# Patient Record
Sex: Female | Born: 1950 | Race: Black or African American | Hispanic: No | Marital: Married | Smoking: Never smoker
Health system: Southern US, Community
[De-identification: ages and names within clinical notes are randomized; demographics above are authoritative.]

## PROBLEM LIST (undated history)

## (undated) DIAGNOSIS — E785 Hyperlipidemia, unspecified: Secondary | ICD-10-CM

## (undated) DIAGNOSIS — I10 Essential (primary) hypertension: Secondary | ICD-10-CM

## (undated) DIAGNOSIS — C541 Malignant neoplasm of endometrium: Secondary | ICD-10-CM

## (undated) DIAGNOSIS — H409 Unspecified glaucoma: Secondary | ICD-10-CM

## (undated) HISTORY — PX: TOTAL KNEE ARTHROPLASTY: SHX125

## (undated) HISTORY — PX: OTHER SURGICAL HISTORY: SHX169

## (undated) HISTORY — PX: TOTAL ABDOMINAL HYSTERECTOMY W/ BILATERAL SALPINGOOPHORECTOMY: SHX83

## (undated) HISTORY — PX: TONSILLECTOMY: SUR1361

---

## 1995-10-28 DIAGNOSIS — D259 Leiomyoma of uterus, unspecified: Secondary | ICD-10-CM | POA: Insufficient documentation

## 2000-04-04 DIAGNOSIS — D509 Iron deficiency anemia, unspecified: Secondary | ICD-10-CM | POA: Diagnosis present

## 2016-08-02 DIAGNOSIS — R7303 Prediabetes: Secondary | ICD-10-CM | POA: Insufficient documentation

## 2020-07-12 DIAGNOSIS — D696 Thrombocytopenia, unspecified: Secondary | ICD-10-CM | POA: Diagnosis present

## 2021-05-19 ENCOUNTER — Other Ambulatory Visit: Payer: Self-pay

## 2021-05-19 ENCOUNTER — Encounter (HOSPITAL_COMMUNITY): Payer: Self-pay | Admitting: Internal Medicine

## 2021-05-19 ENCOUNTER — Inpatient Hospital Stay (HOSPITAL_COMMUNITY)
Admission: AD | Admit: 2021-05-19 | Discharge: 2021-06-02 | DRG: 180 | Disposition: A | Discharge status: Home / Self Care | Payer: Medicare Other | Source: Other Acute Inpatient Hospital | Attending: Family Medicine | Admitting: Family Medicine

## 2021-05-19 DIAGNOSIS — R042 Hemoptysis: Secondary | ICD-10-CM | POA: Diagnosis present

## 2021-05-19 DIAGNOSIS — H409 Unspecified glaucoma: Secondary | ICD-10-CM | POA: Diagnosis present

## 2021-05-19 DIAGNOSIS — C7802 Secondary malignant neoplasm of left lung: Secondary | ICD-10-CM | POA: Diagnosis present

## 2021-05-19 DIAGNOSIS — E876 Hypokalemia: Secondary | ICD-10-CM | POA: Diagnosis not present

## 2021-05-19 DIAGNOSIS — D61818 Other pancytopenia: Secondary | ICD-10-CM | POA: Diagnosis not present

## 2021-05-19 DIAGNOSIS — K5909 Other constipation: Secondary | ICD-10-CM | POA: Insufficient documentation

## 2021-05-19 DIAGNOSIS — R918 Other nonspecific abnormal finding of lung field: Secondary | ICD-10-CM | POA: Diagnosis not present

## 2021-05-19 DIAGNOSIS — Z91040 Latex allergy status: Secondary | ICD-10-CM | POA: Diagnosis not present

## 2021-05-19 DIAGNOSIS — D509 Iron deficiency anemia, unspecified: Secondary | ICD-10-CM | POA: Diagnosis present

## 2021-05-19 DIAGNOSIS — D6959 Other secondary thrombocytopenia: Secondary | ICD-10-CM | POA: Diagnosis present

## 2021-05-19 DIAGNOSIS — E785 Hyperlipidemia, unspecified: Secondary | ICD-10-CM | POA: Diagnosis present

## 2021-05-19 DIAGNOSIS — D649 Anemia, unspecified: Secondary | ICD-10-CM | POA: Diagnosis not present

## 2021-05-19 DIAGNOSIS — I82220 Acute embolism and thrombosis of inferior vena cava: Secondary | ICD-10-CM | POA: Diagnosis not present

## 2021-05-19 DIAGNOSIS — Z9221 Personal history of antineoplastic chemotherapy: Secondary | ICD-10-CM | POA: Diagnosis not present

## 2021-05-19 DIAGNOSIS — I82452 Acute embolism and thrombosis of left peroneal vein: Secondary | ICD-10-CM | POA: Diagnosis not present

## 2021-05-19 DIAGNOSIS — D6481 Anemia due to antineoplastic chemotherapy: Secondary | ICD-10-CM | POA: Diagnosis present

## 2021-05-19 DIAGNOSIS — Z7189 Other specified counseling: Secondary | ICD-10-CM | POA: Diagnosis not present

## 2021-05-19 DIAGNOSIS — Z90710 Acquired absence of both cervix and uterus: Secondary | ICD-10-CM

## 2021-05-19 DIAGNOSIS — I10 Essential (primary) hypertension: Secondary | ICD-10-CM | POA: Diagnosis present

## 2021-05-19 DIAGNOSIS — D630 Anemia in neoplastic disease: Secondary | ICD-10-CM | POA: Diagnosis present

## 2021-05-19 DIAGNOSIS — Z96652 Presence of left artificial knee joint: Secondary | ICD-10-CM | POA: Diagnosis present

## 2021-05-19 DIAGNOSIS — D539 Nutritional anemia, unspecified: Secondary | ICD-10-CM | POA: Diagnosis present

## 2021-05-19 DIAGNOSIS — Z8249 Family history of ischemic heart disease and other diseases of the circulatory system: Secondary | ICD-10-CM | POA: Diagnosis not present

## 2021-05-19 DIAGNOSIS — Z923 Personal history of irradiation: Secondary | ICD-10-CM

## 2021-05-19 DIAGNOSIS — I480 Paroxysmal atrial fibrillation: Secondary | ICD-10-CM | POA: Diagnosis present

## 2021-05-19 DIAGNOSIS — C772 Secondary and unspecified malignant neoplasm of intra-abdominal lymph nodes: Secondary | ICD-10-CM

## 2021-05-19 DIAGNOSIS — D62 Acute posthemorrhagic anemia: Secondary | ICD-10-CM

## 2021-05-19 DIAGNOSIS — Z91048 Other nonmedicinal substance allergy status: Secondary | ICD-10-CM

## 2021-05-19 DIAGNOSIS — C7800 Secondary malignant neoplasm of unspecified lung: Secondary | ICD-10-CM | POA: Diagnosis present

## 2021-05-19 DIAGNOSIS — I82431 Acute embolism and thrombosis of right popliteal vein: Secondary | ICD-10-CM | POA: Diagnosis not present

## 2021-05-19 DIAGNOSIS — C7801 Secondary malignant neoplasm of right lung: Secondary | ICD-10-CM | POA: Diagnosis present

## 2021-05-19 DIAGNOSIS — C541 Malignant neoplasm of endometrium: Principal | ICD-10-CM | POA: Diagnosis present

## 2021-05-19 DIAGNOSIS — D696 Thrombocytopenia, unspecified: Secondary | ICD-10-CM | POA: Diagnosis not present

## 2021-05-19 DIAGNOSIS — Z79899 Other long term (current) drug therapy: Secondary | ICD-10-CM

## 2021-05-19 HISTORY — DX: Hyperlipidemia, unspecified: E78.5

## 2021-05-19 HISTORY — DX: Unspecified glaucoma: H40.9

## 2021-05-19 HISTORY — DX: Malignant neoplasm of endometrium: C54.1

## 2021-05-19 HISTORY — DX: Essential (primary) hypertension: I10

## 2021-05-19 NOTE — Assessment & Plan Note (Deleted)
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

## 2021-05-20 ENCOUNTER — Encounter (HOSPITAL_COMMUNITY): Payer: Self-pay | Admitting: Internal Medicine

## 2021-05-20 ENCOUNTER — Inpatient Hospital Stay (HOSPITAL_COMMUNITY): Payer: Medicare Other

## 2021-05-20 DIAGNOSIS — R042 Hemoptysis: Secondary | ICD-10-CM | POA: Diagnosis not present

## 2021-05-20 DIAGNOSIS — E785 Hyperlipidemia, unspecified: Secondary | ICD-10-CM | POA: Diagnosis present

## 2021-05-20 DIAGNOSIS — D649 Anemia, unspecified: Secondary | ICD-10-CM

## 2021-05-20 DIAGNOSIS — H409 Unspecified glaucoma: Secondary | ICD-10-CM | POA: Diagnosis present

## 2021-05-20 LAB — BASIC METABOLIC PANEL
Anion gap: 7 (ref 5–15)
BUN: 15 mg/dL (ref 8–23)
CO2: 25 mmol/L (ref 22–32)
Calcium: 8.7 mg/dL — ABNORMAL LOW (ref 8.9–10.3)
Chloride: 106 mmol/L (ref 98–111)
Creatinine, Ser: 0.72 mg/dL (ref 0.44–1.00)
GFR, Estimated: >60 mL/min (ref >60)
Glucose, Bld: 122 mg/dL — ABNORMAL HIGH (ref 70–99)
Potassium: 3.4 mmol/L — ABNORMAL LOW (ref 3.5–5.1)
Sodium: 138 mmol/L (ref 135–145)

## 2021-05-20 LAB — CBC
HCT: 23.5 % — ABNORMAL LOW (ref 36.0–46.0)
Hemoglobin: 7.8 g/dL — ABNORMAL LOW (ref 12.0–15.0)
MCH: 35.6 pg — ABNORMAL HIGH (ref 26.0–34.0)
MCHC: 33.2 g/dL (ref 30.0–36.0)
MCV: 107.3 fL — ABNORMAL HIGH (ref 80.0–100.0)
Platelets: 80 10*3/uL — ABNORMAL LOW (ref 150–400)
RBC: 2.19 MIL/uL — ABNORMAL LOW (ref 3.87–5.11)
RDW: 17.7 % — ABNORMAL HIGH (ref 11.5–15.5)
WBC: 4.9 10*3/uL (ref 4.0–10.5)
nRBC: 0 % (ref 0.0–0.2)

## 2021-05-20 IMAGING — DX DG CHEST 1V PORT
1 series · 1 of 1 positions shown · non-contrast
Comparison: None.

CLINICAL DATA: Hemoptysis. Endometrial carcinoma reported in the
medical history.

EXAM:
PORTABLE CHEST 1 VIEW

[chest ap]
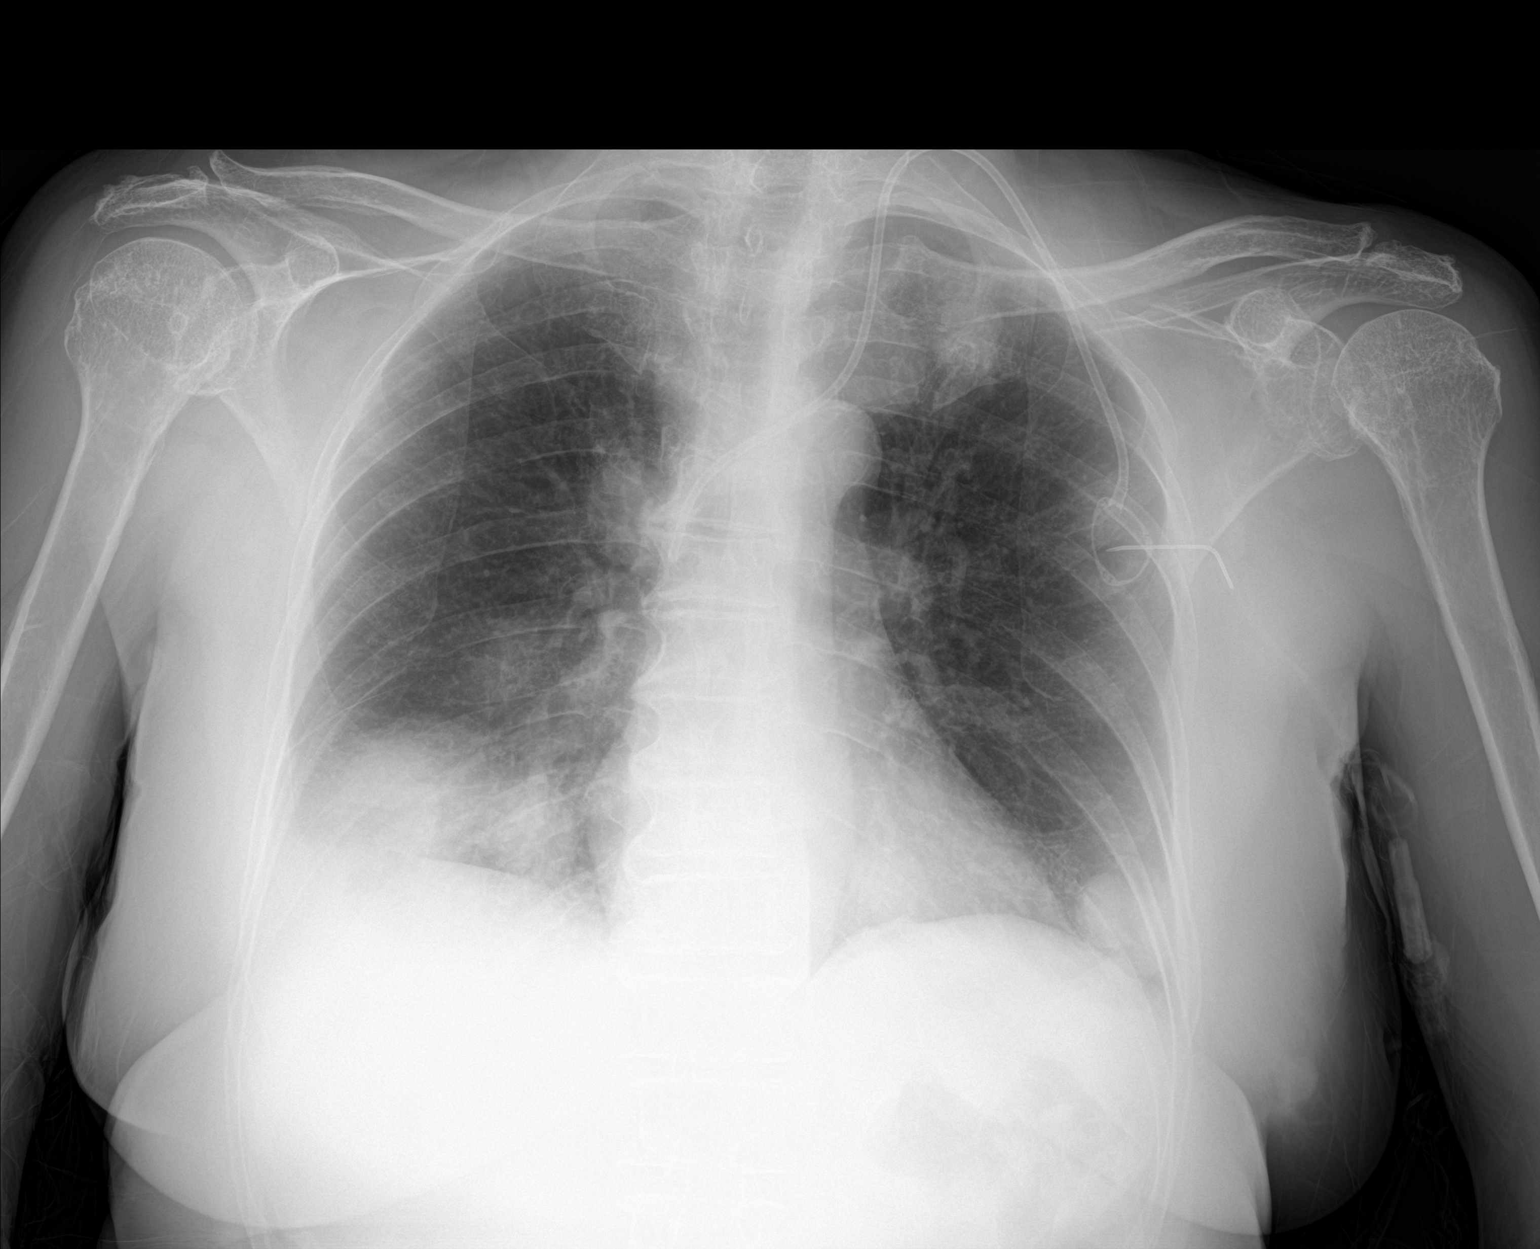

[1 of 1 positions shown; findings below may reference images not displayed]

FINDINGS: Cardiac silhouette is normal in size and configuration. No
mediastinal masses. Focal prominence of the superior right hilum.
Normal left hilar contours.

Rounded opacities are noted in both lung bases, largest on the right
where it measures 6.8 cm. Opacity at the left lateral lung base
measures 2.9 cm. Less well-defined and smaller opacity projects near
the left apex. There is also hazy opacity superimposed over the
inferior right hilar structures. No evidence of pulmonary edema. No
convincing pleural effusion and no pneumothorax.

Skeletal structures are demineralized, but grossly intact.

Left anterior chest wall, internal jugular, Port-A-Cath has its tip
in the mid superior vena cava.
IMPRESSION: 1. Rounded lung opacities, largest at the right lung base, which may
reflect masses/confluent nodules suspected to be metastatic disease.
Pneumonia should be considered if there are consistent clinical
findings. Recommend further assessment with chest CT with contrast.

## 2021-05-20 MED ORDER — SODIUM CHLORIDE 0.9% FLUSH: 10.0000 mL | INTRAVENOUS | Status: DC | PRN

## 2021-05-20 MED ORDER — ACETAMINOPHEN 650 MG RE SUPP: 650.0000 mg | Freq: Four times a day (QID) | RECTAL | Status: DC | PRN

## 2021-05-20 MED ORDER — SENNA 8.6 MG PO TABS
1.0000 | ORAL_TABLET | Freq: Two times a day (BID) | ORAL | Status: DC
  Administered 2021-05-20 – 2021-06-02 (×26): 8.6 mg via ORAL
  Filled 2021-05-20 (×26): qty 1

## 2021-05-20 MED ORDER — SODIUM CHLORIDE 0.9% FLUSH
3.0000 mL | INTRAVENOUS | Status: DC | PRN
Start: 2021-05-20 — End: 2021-06-02
  Administered 2021-05-28: 3 mL via INTRAVENOUS

## 2021-05-20 MED ORDER — CHLORHEXIDINE GLUCONATE CLOTH 2 % EX PADS
6.0000 | MEDICATED_PAD | Freq: Every day | CUTANEOUS | Status: DC
  Administered 2021-05-20 – 2021-06-02 (×14): 6 via TOPICAL

## 2021-05-20 MED ORDER — ACETAMINOPHEN 325 MG PO TABS
650.0000 mg | ORAL_TABLET | Freq: Four times a day (QID) | ORAL | Status: DC | PRN
  Administered 2021-05-24 – 2021-05-28 (×2): 650 mg via ORAL
  Filled 2021-05-20 (×2): qty 2

## 2021-05-20 MED ORDER — TRAZODONE HCL 50 MG PO TABS
25.0000 mg | ORAL_TABLET | Freq: Every evening | ORAL | Status: DC | PRN
  Administered 2021-05-20 – 2021-05-24 (×2): 25 mg via ORAL
  Filled 2021-05-20 (×2): qty 1

## 2021-05-20 MED ORDER — SODIUM CHLORIDE 0.9% FLUSH
3.0000 mL | Freq: Two times a day (BID) | INTRAVENOUS | Status: DC
  Administered 2021-05-20 – 2021-06-02 (×18): 3 mL via INTRAVENOUS

## 2021-05-20 MED ORDER — SODIUM CHLORIDE 0.9% FLUSH
10.0000 mL | Freq: Two times a day (BID) | INTRAVENOUS | Status: DC
  Administered 2021-05-20 – 2021-05-28 (×17): 10 mL
  Administered 2021-05-29: 40 mL
  Administered 2021-05-29 – 2021-06-02 (×8): 10 mL

## 2021-05-20 MED ORDER — SODIUM CHLORIDE 0.9 % IV SOLN
250.0000 mL | INTRAVENOUS | Status: DC | PRN
  Administered 2021-05-28: 250 mL via INTRAVENOUS

## 2021-05-20 MED ORDER — POTASSIUM CHLORIDE CRYS ER 20 MEQ PO TBCR
40.0000 meq | EXTENDED_RELEASE_TABLET | Freq: Once | ORAL | Status: AC
  Administered 2021-05-20: 40 meq via ORAL
  Filled 2021-05-20: qty 2

## 2021-05-20 NOTE — Assessment & Plan Note (Addendum)
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 She continues to have some intermittent hemoptysis, but this appears mostly stable/improving. ?CT 4/1 with right lower lobe mass increased in size, bilateral pulm nodules increased in size, mediastinal and L supraclavicular LAD increased in size ?PCCM signed off 4/3 - recommended continuing to hold anticoagulation given severity of prior bleeds - consider IR pulm angiogram with embolization to area of RLL if rebleeds  - lesion in bronchus intermedius likely inspissated clot ?IR embolization deferred for life threatening hemoptysis ?

## 2021-05-20 NOTE — Subjective & Objective (Signed)
Kristen Freeman, a very pleasant woman, was diagnosed with endometrial cancer in 2019. She was in remission until Dec '22 when she was found to have recurrence. She underwent XRT and chemo completing treatment 04/25/21. She had an episode of hemoptysis 05/17/21. She sought care at ED in [place]. She was found to have thrombocytopenia and did had platelet transfusion.  ? ?Reviewed all labs and radiology from outside hospital: ?Lab - Glucose 115, INR 1.1, Hgb 8.4, WBC 4.4, Plt 59, nl Diff.  ?CTA chest - increased pulmonary mets: multiple large nodules. ? Post-obstructive consolidation with nodule 7.6x3.2x2 cm. Bulky adenopathy. ?EKG - nl ? ?She received platelets in [place] ED. ?

## 2021-05-20 NOTE — H&P (Signed)
? ?History and Physical  ? ? ?Kristen Freeman ZOX:[id number] DOB: January 28, 1951 DOA: 05/19/2021 ? ?DOS: the patient was seen and examined on 05/19/2021 ? ?PCP: Allie Dimmer, MD  ? ?Patient coming from:  transfer from [facility] ED ? ?I have personally briefly reviewed patient's old medical records in [place] ? ?Kristen Freeman, a very pleasant woman, was diagnosed with endometrial cancer in 2019. She was in remission until Dec '22 when she was found to have recurrence. She underwent XRT and chemo completing treatment 04/25/21. She had an episode of hemoptysis 05/17/21. She sought care at ED in [place]. She was found to have thrombocytopenia and did had platelet transfusion.  ? ?Reviewed all labs and radiology from outside hospital: ?Lab - Glucose 115, INR 1.1, Hgb 8.4, WBC 4.4, Plt 59, nl Diff.  ?CTA chest - increased pulmonary mets: multiple large nodules. ? Post-obstructive consolidation with nodule 7.6x3.2x2 cm. Bulky adenopathy. ?EKG - nl ? ?She received platelets in [place] ED.  ? ?ED Course: direct admit ? ?Review of Systems:  ?Review of Systems  ?Constitutional: Negative.   ?HENT: Negative.    ?Eyes: Negative.   ?Respiratory:  Positive for cough and hemoptysis.   ?Cardiovascular: Negative.   ?Gastrointestinal: Negative.   ?Genitourinary: Negative.   ?Musculoskeletal: Negative.   ?Skin: Negative.   ?Neurological: Negative.   ?Endo/Heme/Allergies: Negative.   ?Psychiatric/Behavioral: Negative.    ? ?Past Medical History:  ?Diagnosis Date  ? Endometrial cancer ([place])   ? dx 2019 tx'd; recurrence 12/22 - XRT x 30, Chem 03/21/21 to 04/25/21  ? Glaucoma   ? HLD (hyperlipidemia)   ? on statin  ? HTN (hypertension)   ? ? ?Past Surgical History:  ?Procedure Laterality Date  ? TM repair N/A   ? TONSILLECTOMY    ? TOTAL ABDOMINAL HYSTERECTOMY W/ BILATERAL SALPINGOOPHORECTOMY    ? TOTAL KNEE ARTHROPLASTY Left   ? ? ?Soc Hx - married 2 years. Three children, 7 grandchilden, 1 great. Worked as a Engineer, structural, now retired. Lives with husband. I-ADLs ? ? has no history on file for tobacco use, alcohol use, and drug use. ? ?Allergies  ?Allergen Reactions  ? Latex   ? ? ?Family History  ?Problem Relation Age of Onset  ? Alzheimer's disease Mother   ? Diabetes Father   ? Prostate cancer Father   ? Hypertension Sister   ? Diabetes Sister   ? Breast cancer Sister   ? Hypertension Sister   ? Diabetes Sister   ? CVA Brother   ? Alcoholism Brother   ? ? ?Prior to Admission medications   ?Not on File  ? ? ?Physical Exam: ?Vitals:  ? 05/19/21 2117  ?BP: 133/74  ?Pulse: (!) 109  ?Resp: 20  ?Temp: 99.2 ?F (37.3 ?C)  ?TempSrc: Oral  ?SpO2: 100%  ? ? ?Physical Exam ?Constitutional:   ?   Appearance: Normal appearance. She is normal weight.  ?HENT:  ?   Head: Normocephalic and atraumatic.  ?   Mouth/Throat:  ?   Mouth: Mucous membranes are moist.  ?   Pharynx: Oropharynx is clear.  ?Eyes:  ?   Extraocular Movements: Extraocular movements intact.  ?   Conjunctiva/sclera: Conjunctivae normal.  ?   Pupils: Pupils are equal, round, and reactive to light.  ?Cardiovascular:  ?   Rate and Rhythm: Normal rate and regular rhythm.  ?   Pulses: Normal pulses.  ?   Heart sounds: Normal heart sounds.  ?Pulmonary:  ?   Effort:  Pulmonary effort is normal.  ?   Breath sounds: Normal breath sounds.  ?Abdominal:  ?   General: Abdomen is flat. Bowel sounds are normal.  ?   Palpations: Abdomen is soft.  ?Musculoskeletal:     ?   General: Normal range of motion.  ?   Cervical back: Normal range of motion and neck supple.  ?Lymphadenopathy:  ?   Head:  ?   Right side of head: No submental or submandibular adenopathy.  ?   Left side of head: No submental or submandibular adenopathy.  ?   Cervical: Cervical adenopathy present.  ?   Right cervical: No superficial cervical adenopathy. ?   Left cervical: No superficial cervical adenopathy.  ?   Upper Body:  ?   Right upper body: No supraclavicular or axillary adenopathy.  ?   Left upper body:  Supraclavicular adenopathy present. No axillary adenopathy.  ?   Lower Body: No right inguinal adenopathy. No left inguinal adenopathy.  ?   Comments: 1 cm firm left supraclavicular node  ?Skin: ?   General: Skin is warm and dry.  ?Neurological:  ?   General: No focal deficit present.  ?   Mental Status: She is alert and oriented to person, place, and time.  ?Psychiatric:     ?   Mood and Affect: Mood normal.     ?   Behavior: Behavior normal.  ?  ? ?Labs on Admission: I have personally reviewed following labs and imaging studies ? ?CBC: ?No results for input(s): WBC, NEUTROABS, HGB, HCT, MCV, PLT in the last 168 hours. ?Basic Metabolic Panel: ?No results for input(s): NA, K, CL, CO2, GLUCOSE, BUN, CREATININE, CALCIUM, MG, PHOS in the last 168 hours. ?GFR: ?CrCl cannot be calculated (No successful lab value found.). ?Liver Function Tests: ?No results for input(s): AST, ALT, ALKPHOS, BILITOT, PROT, ALBUMIN in the last 168 hours. ?No results for input(s): LIPASE, AMYLASE in the last 168 hours. ?No results for input(s): AMMONIA in the last 168 hours. ?Coagulation Profile: ?No results for input(s): INR, PROTIME in the last 168 hours. ?Cardiac Enzymes: ?No results for input(s): CKTOTAL, CKMB, CKMBINDEX, TROPONINI in the last 168 hours. ?BNP (last 3 results) ?No results for input(s): PROBNP in the last 8760 hours. ?HbA1C: ?No results for input(s): HGBA1C in the last 72 hours. ?CBG: ?No results for input(s): GLUCAP in the last 168 hours. ?Lipid Profile: ?No results for input(s): CHOL, HDL, LDLCALC, TRIG, CHOLHDL, LDLDIRECT in the last 72 hours. ?Thyroid Function Tests: ?No results for input(s): TSH, T4TOTAL, FREET4, T3FREE, THYROIDAB in the last 72 hours. ?Anemia Panel: ?No results for input(s): VITAMINB12, FOLATE, FERRITIN, TIBC, IRON, RETICCTPCT in the last 72 hours. ?Urine analysis: ?No results found for: COLORURINE, APPEARANCEUR, [place], [place], [place], [place], [place], KETONESUR, PROTEINUR, [place],  NITRITE, LEUKOCYTESUR ? ?Radiological Exams on Admission: I have personally reviewed images ?No results found. ? ?EKG: I have personally reviewed EKG: reviewed outside EKG - Nl ? ?Assessment/Plan ?Principal Problem: ?  Hemoptysis ?Active Problems: ?  Endometrial cancer ([place]) ?  HTN (hypertension) ?  HLD (hyperlipidemia) ?  Glaucoma ?  ? ?Assessment and Plan: ?* Hemoptysis ?Patient presented to ED in [place] with small volume hemoptysis. She has had several occurrences. MOst likely related to pulmonary metastatic disease ? ?Plan Pulmonary consult - bronchoscopy. ? ?Endometrial cancer ([place]) ?Patient diagnosed 2019 - treated, leading to remission. Recurrence Dec'22, received XRT x 30 txs 03/21/21, chemo thru 04/25/21. Now with lung nodules, left supraclavicular adenopathy, hemoptysis. ? ?Plan Regular admit ?  PUlm consult - may need bronchoscopy ? Oncology/hematology consult re: completing workup and treatment plan ? ?Glaucoma ?May use eye drops from home. ? ?HLD (hyperlipidemia) ?Stable -continue home meds ? ?HTN (hypertension) ?Long standing problem that has been well controlled ? ?Plan Continue home regimen ? ? ?Code status - discussed with patient. She had not considered in the past. Full code at this time. Referred her and her son to [email or web address] ? ? ? ?DVT prophylaxis:  TED stockings ?Code Status: Full Code ?Family Communication: son present during interview. Answered all questions  ?Disposition Plan: home when stable. Location  of treatment to be determined  ?Consults called: Pul - needs to be called in AM; Oncololgy/hematology -needs to be called in AT  ?Admission status: Inpatient, Med-Surg ? ? ?Adella Hare, MD ?[facility] ?05/20/2021, 12:16 AM  ? ? ?

## 2021-05-20 NOTE — Hospital Course (Addendum)
71 year old female with history of endometrial cancer in 2019 and in remission until Dec '22 when she was found to have recurrence, underwent XRT and chemo completing treatment 04/25/21. She had an episode of hemoptysis 05/17/21. She sought care at ED in [place]. She was found to have thrombocytopenia and did had platelet transfusion. Other Lab - Glucose 115, INR 1.1, Hgb 8.4, WBC 4.4, Plt 59, nl Diff. CTA chest - increased pulmonary mets: multiple large nodules. ? Post-obstructive consolidation with nodule 7.6x3.2x2 cm. Bulky adenopathy.EKG - nl.  She was subsequently transferred to [facility] for further evaluation and management.   She developed hemoptysis and was transferred to ICU/stepdown.  Now s/p bronchoscopy showing right bronchus intermedius lesion - clot with tumor (blocking entire distal airway into RML and RLL).  Radiation oncology and oncology were consulted.  She was treated with TXA nebs, has now completed radiation therapy.  She also is now s/p chemotherapy.  Her hemoptysis has overall improved, but is still persistent.  Notably, she had imaging with IVC thrombosis.  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  She's not currently Kermitt Harjo candidate for anticoagulation given her ongoing hemoptysis.  IR noted she was not candidate for thrombectomy/IVC filter.  At this time she's been stable, planning for outpatient oncology follow up. ? ?See below for additional details ?

## 2021-05-20 NOTE — Assessment & Plan Note (Addendum)
Metoprolol, will d/c HCTZ/k supplementation as she's done ok off this ?

## 2021-05-20 NOTE — Consult Note (Addendum)
? ?NAME:  Kristen Freeman, MRN:  [id number], DOB:  1950/07/17, LOS: 1 ?ADMISSION DATE:  05/19/2021, CONSULTATION DATE:  05/20/21 ?REFERRING MD:  Dr Antonieta Pert fo [facility], CHIEF COMPLAINT:  Hemoptysis  ? ? ?History of Present Illness: -Provided by the patient and also review of the admission records with the hospitalist  ?71 year old female who lives in [place].  Diagnosed with endometrial cancer and other specified 2019 and Lovenox [place].  Was under the care of Juluis Rainier.  She says she got some chemotherapy and was under remission.  Then in December 2022 she had recurrence in the lymphadenopathy in the abdomen.  Status post radiation and chemo.  Was doing well till 05/17/2021 when she had small amount of hemoptysis early in the morning on Wednesday.  Then repeat hemoptysis on 05/18/2021 Thursday.  Then again repeat small amount on 05/19/2021 Friday.  This time was a little more and there was a blood clot.  She then went to the emergency department at [facility].  She had a CT chest [images are not available they did not send the CD-ROM].  But this showed a mass and therefore she has been referred here.  She is here for the hemoptysis and also she wants to transfer her oncology care to [facility].  She tells me that Dr. Beatrix Fetters is relocating and is not going to be available anymore.  Apparently in the ER they gave her platelets for a platelet count of 59,000.  Since then has been no further hemoptysis.  She has not been collecting her hemoptysis any sputum cup. ? ?According to the hospitalist the CT chest showed pulmonary met metastasis.  There is one mass 7.6 cm with bulky adenopathy.  Chest x-ray clear shows bilateral lower lobe density right greater than left Largest is 6.8 cm.  Looks smooth ? ?Past Medical History:  ? ? has a past medical history of Endometrial cancer ([place]), Glaucoma, HLD (hyperlipidemia), and HTN (hypertension). ? ? reports that she has never smoked. She has never used smokeless  tobacco. ? ?Past Surgical History:  ?Procedure Laterality Date  ? TM repair N/A   ? TONSILLECTOMY    ? TOTAL ABDOMINAL HYSTERECTOMY W/ BILATERAL SALPINGOOPHORECTOMY    ? TOTAL KNEE ARTHROPLASTY Left   ? ? ?Allergies  ?Allergen Reactions  ? Latex   ? ? ?Immunization History  ?Administered Date(s) Administered  ? Moderna Sars-Covid-2 Vaccination 12/27/2019  ? ? ?Family History  ?Problem Relation Age of Onset  ? Alzheimer's disease Mother   ? Diabetes Father   ? Prostate cancer Father   ? Hypertension Sister   ? Diabetes Sister   ? Breast cancer Sister   ? Hypertension Sister   ? Diabetes Sister   ? CVA Brother   ? Alcoholism Brother   ? ? ? ?Current Facility-Administered Medications:  ?  0.9 %  sodium chloride infusion, 250 mL, Intravenous, PRN, Norins, Heinz Knuckles, MD ?  acetaminophen (TYLENOL) tablet 650 mg, 650 mg, Oral, Q6H PRN **OR** acetaminophen (TYLENOL) suppository 650 mg, 650 mg, Rectal, Q6H PRN, Norins, Heinz Knuckles, MD ?  Chlorhexidine Gluconate Cloth 2 % PADS 6 each, 6 each, Topical, Daily, Norins, Heinz Knuckles, MD, 6 each at 05/20/21 1108 ?  senna (SENOKOT) tablet 8.6 mg, 1 tablet, Oral, BID, Norins, Heinz Knuckles, MD, 8.6 mg at 05/20/21 1053 ?  sodium chloride flush (NS) 0.9 % injection 10-40 mL, 10-40 mL, Intracatheter, Q12H, Norins, Heinz Knuckles, MD, 10 mL at 05/20/21 1030 ?  sodium chloride flush (NS) 0.9 %  injection 10-40 mL, 10-40 mL, Intracatheter, PRN, Norins, Heinz Knuckles, MD ?  sodium chloride flush (NS) 0.9 % injection 3 mL, 3 mL, Intravenous, Q12H, Norins, Heinz Knuckles, MD, 3 mL at 05/20/21 1030 ?  sodium chloride flush (NS) 0.9 % injection 3 mL, 3 mL, Intravenous, PRN, Norins, Heinz Knuckles, MD ?  traZODone (DESYREL) tablet 25 mg, 25 mg, Oral, QHS PRN, Norins, Heinz Knuckles, MD ? ? ? ? ?Significant Hospital Events:  ?05/19/2021 - admit ? ?Interim History / Subjective:  ? ?05/20/2021 - seen in bed 1520 at [facility] ? ?Objective   ?Blood pressure (!) 95/58, pulse 95, temperature 98.1 ?F (36.7 ?C), temperature  source Oral, resp. rate 16, SpO2 98 %. ?   ?   ? ?Intake/Output Summary (Last 24 hours) at 05/20/2021 1552 ?Last data filed at 05/20/2021 0930 ?Gross per 24 hour  ?Intake 240 ml  ?Output --  ?Net 240 ml  ? ?There were no vitals filed for this visit. ? ?Examination: ?General: Thin pleasant lady sitting in her bed and talking to family members that include her husband, sister, son, brother-in-law ?HENT: Some alopecia present no neck nodes.  Hearing aid present ?Lungs: Clear to auscultation bilaterally ?Cardiovascular: Normal heart sounds ?Abdomen: Soft nontender no organomegaly ?Extremities: No cyanosis.  No edema.  Possible clubbing present ?Neuro: Alert and oriented x3.  Speech normal. ?GU: Not examined. ? ?Resolved Hospital Problem list   ?x ? ?Assessment & Plan:  ?ASSESSMENT / PLAN: ? 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 ?05/20/2021 -> no hemoptysis since admission ? ?P:   ?Measure hemoptysis and sputum cup [gave verbal instructions to nursing] ? ?If anything more than quarter-We will need interventional radiology [we will need CT imaging to help guide interventional radiologist] ? -If she declines fasting then we can get imaging she needs a stat CT scan of the chest here ? ?Need to get the CT scan from [place].  Actual disc image uploaded here ASAP.Jorene Minors Dr. Antonieta Pert the hospitalist will facilitate.] ? ?Hospitalist to coordinate transfer of care to [place] ? ?Until hemoptysis is massive with observation therapy with primary addressal  of the cancer ? ?Avoid anticoagulaton ? ?Best practice (daily eval):  ?According to the hospitalist ? ?Goals of Care:  ? ? ? ? ?Family Updates: Family updated ? ? ? ? ? ? ?SIGNATURE  ? ? ?Dr. Brand Males, M.D., F.C.C.P,  ?Pulmonary and Critical Care Medicine ?Staff Physician, [place] ?Center Director -  Interstitial Lung Disease  Program  ?Pulmonary [facility] ?[place], [place], [address] ? ?NPI Number:  NPI #[id number] ? ?Pager: [phone], If no answer  -> Check AMION or Try [phone] ?Telephone (clinical office): [phone] ?Telephone (research): [phone] ? ?3:52 PM ?05/20/2021 ? ? ?05/20/2021 ?3:52 PM ? ? ? ?LABS  ? ? ?PULMONARY ?No results for input(s): PHART, PCO2ART, PO2ART, HCO3, TCO2, O2SAT in the last 168 hours. ? ?Invalid input(s): PCO2, PO2 ? ?CBC ?Recent Labs  ?Lab 05/20/21 ?9983  ?HGB 7.8*  ?HCT 23.5*  ?WBC 4.9  ?PLT 80*  ? ? ?COAGULATION ?No results for input(s): INR in the last 168 hours. ? ?CARDIAC ? No results for input(s): TROPONINI in the last 168 hours. ?No results for input(s): PROBNP in the last 168 hours. ? ?CHEMISTRY ?Recent Labs  ?  Lab 05/20/21 ?7182  ?NA 138  ?K 3.4*  ?CL 106  ?CO2 25  ?GLUCOSE 122*  ?BUN 15  ?CREATININE 0.72  ?CALCIUM 8.7*  ? ?CrCl cannot be calculated (Unknown ideal weight.). ? ? ?LIVER ?No results for input(s): AST, ALT, ALKPHOS, BILITOT, PROT, ALBUMIN, INR in the last 168 hours. ? ? ?INFECTIOUS ?No results for input(s): LATICACIDVEN, PROCALCITON in the last 168 hours. ? ? ?ENDOCRINE ?CBG (last 3)  ?No results for input(s): GLUCAP in the last 72 hours. ? ? ? ? ? ? ?IMAGING x48h  - image(s) personally visualized  -   highlighted in bold ?DG CHEST PORT 1 VIEW ? ?Result Date: 05/20/2021 ?CLINICAL DATA:  Hemoptysis. Endometrial carcinoma reported in the medical history. EXAM: PORTABLE CHEST 1 VIEW COMPARISON:  None. FINDINGS: Cardiac silhouette is normal in size and configuration. No mediastinal masses. Focal prominence of the superior right hilum. Normal left hilar contours. Rounded opacities are noted in both lung bases, largest on the right where it measures 6.8 cm. Opacity at the left lateral lung base measures 2.9 cm. Less well-defined and smaller opacity projects near the left apex. There is also hazy opacity  superimposed over the inferior right hilar structures. No evidence of pulmonary edema. No convincing pleural effusion and no pneumothorax. Skeletal structures are demineralized, but grossly intact. Left anterior c

## 2021-05-20 NOTE — Assessment & Plan Note (Addendum)
Resume atorva at home ?

## 2021-05-20 NOTE — Progress Notes (Signed)
?PROGRESS NOTE ?Kristen Freeman  OEU:[id number] DOB: 1950/08/05 DOA: 05/19/2021 ?PCP: Allie Dimmer, MD  ? ?Brief Narrative/Hospital Course: ?71 year old female with history of endometrial cancer in 2019 and in remission until Dec '22 when she was found to have recurrence, underwent XRT and chemo completing treatment 04/25/21. She had an episode of hemoptysis 05/17/21. She sought care at ED in [place]. She was found to have thrombocytopenia and did had platelet transfusion. Other Lab - Glucose 115, INR 1.1, Hgb 8.4, WBC 4.4, Plt 59, nl Diff. CTA chest - increased pulmonary mets: multiple large nodules. ? Post-obstructive consolidation with nodule 7.6x3.2x2 cm. Bulky adenopathy.EKG - nl.  She was subsequently transferred to [facility] for further evaluation and management.   ?  ?Subjective: ?Seen and examined.  No more hematemesis since yesterday had some in tissue.  Son at the bedside patient pleasant alert awake oriented not in distress.  On room air. ? ?Assessment and Plan: ?Principal Problem: ?  Hemoptysis ?Active Problems: ?  Endometrial cancer ([place]) ?  HTN (hypertension) ?  HLD (hyperlipidemia) ?  Glaucoma ?  Normocytic anemia ?  Iron deficiency anemia ?  Thrombocytopenia ([place]) ?  ?Hemoptysis ?Pulmonary metastatic disease: ?CTA-with large multiple nodules/?  Postobstructive consolidation with nodule 7.6 X3.2X 2 cm and bulky adenopathy.  Patient reports he had a CT scan on 3/23 that showed 2.7 cm lung nodule and had gotten bigger to 7.6 m on CT scan on 3/24. ?presentation likely due to her metastatic disease and other findings.  We will consult pulmonary-notify Dr Chase Caller. ? ?Endometrial cancer diagnosed 2019 in remission until December 22 and had XRTx 30 treatment 03/21/21, chemo through 04/25/21. Now with lung nodules, left supraclavicular adenopathy, hemoptysis. ?May need heme-onc evaluation for further plan ? ?Thrombocytopenia- plt was 59 at outside ED- today at 80.  She had received platelet  transfusion outside.  Monitor ? ?Anemia: Hemoglobin 7.8, transfuse as needed ?Hypokalemia-we will replete ?Glaucoma ?Hyperlipidemia ?Hypertension ? ?DVT prophylaxis: Place TED hose Start: 05/20/21 0015 ?Code Status:   Code Status: Full Code ?Family Communication: plan of care discussed with patient/son at bedside. ?Patient status is: inaptient Level of care: Med-Surg  ?Remains inpatient because: Ongoing management of hemodialysis ?Patient currently not stable ? ?Dispo: The patient is from: home ?           Anticipated disposition: home ? ?Mobility Assessment (last 72 hours)   ? ? Mobility Assessment   ? ? [place] Name 05/19/21 2100  ?  ?  ?  ?  ? Does patient have an order for bedrest or is patient medically unstable No - Continue assessment      ? What is the highest level of mobility based on the progressive mobility assessment? Level 5 (Walks with assist in room/hall) - Balance while stepping forward/back and can walk in room with assist - Complete      ? ?  ?  ? ?  ?  ? ?Objective: ?Vitals last 24 hrs: ?Vitals:  ? 05/19/21 2117 05/20/21 0117 05/20/21 0525 05/20/21 0918  ?BP: 133/74 104/64 (!) 94/56 (!) 101/58  ?Pulse: (!) 109 100 100 (!) 102  ?Resp: '20 20 18 18  '$ ?Temp: 99.2 ?F (37.3 ?C) 98.6 ?F (37 ?C) 98.6 ?F (37 ?C) 98 ?F (36.7 ?C)  ?TempSrc: Oral Oral Oral Oral  ?SpO2: 100% 100% 99% 100%  ? ?Weight change:  ? ?Physical Examination: ?General exam: AA0x3, pleasant, older than stated age, weak appearing. ?HEENT:Oral mucosa moist, Ear/Nose WNL grossly, dentition normal. ?Respiratory system: bilaterally diminished  at bases, no use of accessory muscle ?Cardiovascular system: S1 & S2 +, No JVD,. ?Gastrointestinal system: Abdomen soft,NT,ND, BS+ ?Nervous System:Alert, awake, moving extremities and grossly nonfocal ?Extremities: LE edema none,distal peripheral pulses palpable.  ?Skin: No rashes,no icterus. ?MSK: Normal muscle bulk,tone, power ? ?Medications reviewed:  ?Scheduled Meds: ? Chlorhexidine Gluconate Cloth  6  each Topical Daily  ? potassium chloride  40 mEq Oral Once  ? senna  1 tablet Oral BID  ? sodium chloride flush  10-40 mL Intracatheter Q12H  ? sodium chloride flush  3 mL Intravenous Q12H  ? ?Continuous Infusions: ? sodium chloride    ? ? ?  ?Diet Order   ? ?       ?  Diet regular Room service appropriate? Yes; Fluid consistency: Thin  Diet effective now       ?  ? ?  ?  ? ?  ?  ? ?  ?  ?  ? ?No intake or output data in the 24 hours ending 06-10-21 1019 ?Net IO Since Admission: No IO data has been entered for this period [June 10, 2021 1019]  ?Wt Readings from Last 3 Encounters:  ?No data found for Wt  ?  ? ?Unresulted Labs (From admission, onward)  ? ? None  ? ?  ?Data Reviewed: I have personally reviewed following labs and imaging studies ?CBC: ?Recent Labs  ?Lab 06/10/21 ?4627  ?WBC 4.9  ?HGB 7.8*  ?HCT 23.5*  ?MCV 107.3*  ?PLT 80*  ? ?Basic Metabolic Panel: ?Recent Labs  ?Lab Jun 10, 2021 ?0350  ?NA 138  ?K 3.4*  ?CL 106  ?CO2 25  ?GLUCOSE 122*  ?BUN 15  ?CREATININE 0.72  ?CALCIUM 8.7*  ? ?GFR: ?CrCl cannot be calculated (Unknown ideal weight.). ?Liver Function Tests: ?No results for input(s): AST, ALT, ALKPHOS, BILITOT, PROT, ALBUMIN in the last 168 hours. ?No results for input(s): LIPASE, AMYLASE in the last 168 hours. ?No results for input(s): AMMONIA in the last 168 hours. ?Coagulation Profile: ?No results for input(s): INR, PROTIME in the last 168 hours. ?BNP (last 3 results) ?No results for input(s): PROBNP in the last 8760 hours. ?HbA1C: ?No results for input(s): HGBA1C in the last 72 hours. ?CBG: ?No results for input(s): GLUCAP in the last 168 hours. ?Lipid Profile: ?No results for input(s): CHOL, HDL, LDLCALC, TRIG, CHOLHDL, LDLDIRECT in the last 72 hours. ?Thyroid Function Tests: ?No results for input(s): TSH, T4TOTAL, FREET4, T3FREE, THYROIDAB in the last 72 hours. ?Sepsis Labs: ?No results for input(s): PROCALCITON, LATICACIDVEN in the last 168 hours. ? ?No results found for this or any previous visit (from  the past 240 hour(s)).  ?Antimicrobials: ?Anti-infectives (From admission, onward)  ? ? None  ? ?  ? ?Culture/Microbiology ?No results found for: SDES, [place], [place], REPTSTATUS  ?Other culture-see note  ?Radiology Studies: ?DG CHEST PORT 1 VIEW ? ?Result Date: 06-10-21 ?CLINICAL DATA:  Hemoptysis. Endometrial carcinoma reported in the medical history. EXAM: PORTABLE CHEST 1 VIEW COMPARISON:  None. FINDINGS: Cardiac silhouette is normal in size and configuration. No mediastinal masses. Focal prominence of the superior right hilum. Normal left hilar contours. Rounded opacities are noted in both lung bases, largest on the right where it measures 6.8 cm. Opacity at the left lateral lung base measures 2.9 cm. Less well-defined and smaller opacity projects near the left apex. There is also hazy opacity superimposed over the inferior right hilar structures. No evidence of pulmonary edema. No convincing pleural effusion and no pneumothorax. Skeletal structures are demineralized, but grossly  intact. Left anterior chest wall, internal jugular, Port-A-Cath has its tip in the mid superior vena cava. IMPRESSION: 1. Rounded lung opacities, largest at the right lung base, which may reflect masses/confluent nodules suspected to be metastatic disease. Pneumonia should be considered if there are consistent clinical findings. Recommend further assessment with chest CT with contrast. Electronically Signed   By: Lajean Manes M.D.   On: 05/20/2021 08:16   ? ? LOS: 1 day  ? ?Antonieta Pert, MD ?[facility] ? ?05/20/2021, 10:19 AM  ?  ?

## 2021-05-20 NOTE — Plan of Care (Signed)
Pt admitted to unit this shift, no acute events. ?Problem: Education: ?Goal: Knowledge of General Education information will improve ?Description: Including pain rating scale, medication(s)/side effects and non-pharmacologic comfort measures ?Outcome: Progressing ?  ?Problem: Health Behavior/Discharge Planning: ?Goal: Ability to manage health-related needs will improve ?Outcome: Progressing ?  ?Problem: Clinical Measurements: ?Goal: Ability to maintain clinical measurements within normal limits will improve ?Outcome: Progressing ?Goal: Will remain free from infection ?Outcome: Progressing ?Goal: Diagnostic test results will improve ?Outcome: Progressing ?Goal: Respiratory complications will improve ?Outcome: Progressing ?Goal: Cardiovascular complication will be avoided ?Outcome: Progressing ?  ?Problem: Activity: ?Goal: Risk for activity intolerance will decrease ?Outcome: Progressing ?  ?Problem: Nutrition: ?Goal: Adequate nutrition will be maintained ?Outcome: Progressing ?  ?Problem: Coping: ?Goal: Level of anxiety will decrease ?Outcome: Progressing ?  ?Problem: Elimination: ?Goal: Will not experience complications related to bowel motility ?Outcome: Progressing ?Goal: Will not experience complications related to urinary retention ?Outcome: Progressing ?  ?Problem: Pain Managment: ?Goal: General experience of comfort will improve ?Outcome: Progressing ?  ?Problem: Safety: ?Goal: Ability to remain free from injury will improve ?Outcome: Progressing ?  ?Problem: Skin Integrity: ?Goal: Risk for impaired skin integrity will decrease ?Outcome: Progressing ?  ?

## 2021-05-20 NOTE — Assessment & Plan Note (Signed)
May use eye drops from home. ?

## 2021-05-21 ENCOUNTER — Inpatient Hospital Stay (HOSPITAL_COMMUNITY): Payer: Medicare Other

## 2021-05-21 DIAGNOSIS — C541 Malignant neoplasm of endometrium: Secondary | ICD-10-CM

## 2021-05-21 DIAGNOSIS — R042 Hemoptysis: Secondary | ICD-10-CM | POA: Diagnosis not present

## 2021-05-21 LAB — PHOSPHORUS: Phosphorus: 3.3 mg/dL (ref 2.5–4.6)

## 2021-05-21 LAB — ABO/RH
ABO/RH(D): O POS
ABO/RH(D): O POS

## 2021-05-21 LAB — CK TOTAL AND CKMB (NOT AT ARMC)
CK, MB: 11.4 ng/mL — ABNORMAL HIGH (ref 0.5–5.0)
Relative Index: INVALID (ref 0.0–2.5)
Total CK: 68 U/L (ref 38–234)

## 2021-05-21 LAB — BASIC METABOLIC PANEL
Anion gap: 6 (ref 5–15)
BUN: 14 mg/dL (ref 8–23)
CO2: 24 mmol/L (ref 22–32)
Calcium: 8.7 mg/dL — ABNORMAL LOW (ref 8.9–10.3)
Chloride: 106 mmol/L (ref 98–111)
Creatinine, Ser: 0.64 mg/dL (ref 0.44–1.00)
GFR, Estimated: >60 mL/min (ref >60)
Glucose, Bld: 127 mg/dL — ABNORMAL HIGH (ref 70–99)
Potassium: 3.6 mmol/L (ref 3.5–5.1)
Sodium: 136 mmol/L (ref 135–145)

## 2021-05-21 LAB — PROTIME-INR
INR: 1.3 — ABNORMAL HIGH (ref 0.8–1.2)
Prothrombin Time: 15.9 seconds — ABNORMAL HIGH (ref 11.4–15.2)

## 2021-05-21 LAB — HEPATIC FUNCTION PANEL
ALT: 23 U/L (ref 0–44)
AST: 27 U/L (ref 15–41)
Albumin: 3.3 g/dL — ABNORMAL LOW (ref 3.5–5.0)
Alkaline Phosphatase: 67 U/L (ref 38–126)
Bilirubin, Direct: 0.2 mg/dL (ref 0.0–0.2)
Indirect Bilirubin: 0.6 mg/dL (ref 0.3–0.9)
Total Bilirubin: 0.8 mg/dL (ref 0.3–1.2)
Total Protein: 6.7 g/dL (ref 6.5–8.1)

## 2021-05-21 LAB — TROPONIN I (HIGH SENSITIVITY): Troponin I (High Sensitivity): 3 ng/L (ref <18)

## 2021-05-21 LAB — CBC
HCT: 23.6 % — ABNORMAL LOW (ref 36.0–46.0)
Hemoglobin: 7.6 g/dL — ABNORMAL LOW (ref 12.0–15.0)
MCH: 34.9 pg — ABNORMAL HIGH (ref 26.0–34.0)
MCHC: 32.2 g/dL (ref 30.0–36.0)
MCV: 108.3 fL — ABNORMAL HIGH (ref 80.0–100.0)
Platelets: 66 10*3/uL — ABNORMAL LOW (ref 150–400)
RBC: 2.18 MIL/uL — ABNORMAL LOW (ref 3.87–5.11)
RDW: 17.4 % — ABNORMAL HIGH (ref 11.5–15.5)
WBC: 4.9 10*3/uL (ref 4.0–10.5)
nRBC: 0 % (ref 0.0–0.2)

## 2021-05-21 LAB — MRSA NEXT GEN BY PCR, NASAL: MRSA by PCR Next Gen: NOT DETECTED

## 2021-05-21 LAB — SEDIMENTATION RATE: Sed Rate: 56 mm/hr — ABNORMAL HIGH (ref 0–22)

## 2021-05-21 LAB — LACTIC ACID, PLASMA: Lactic Acid, Venous: 1 mmol/L (ref 0.5–1.9)

## 2021-05-21 LAB — MAGNESIUM: Magnesium: 2.2 mg/dL (ref 1.7–2.4)

## 2021-05-21 IMAGING — CT CT ANGIO CHEST
3 of 7 series · 17 of 36 positions shown · IV contrast (agent unspecified)
Comparison: No priors.

CLINICAL DATA: 70-year-old female with history of abnormal chest
x-ray. Endometrial cancer. Hemoptysis.

* Tracking Code: BO *
EXAM:
CT ANGIOGRAPHY CHEST WITH CONTRAST
TECHNIQUE: Multidetector CT imaging of the chest was performed using the
standard protocol during bolus administration of intravenous
contrast. Multiplanar CT image reconstructions and MIPs were
obtained to evaluate the vascular anatomy.

[Series 5: thins · axial · 0.62mm/px · z∈[+1240,+1456]mm · 13 of 255 slices shown]
[im 19/255  lung]
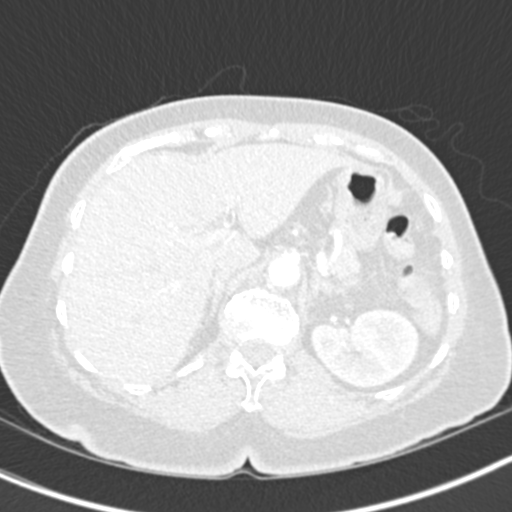
[im 37/255  mediastinal]
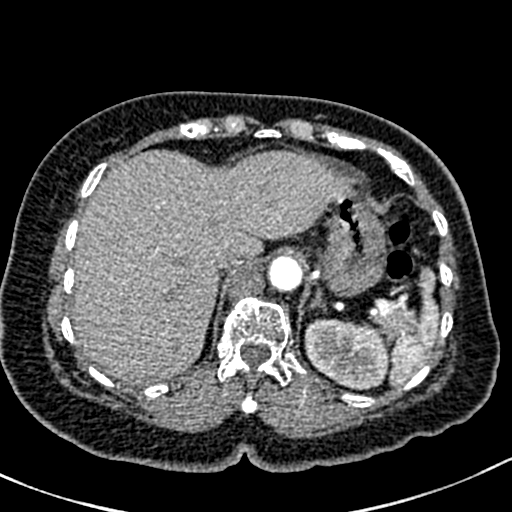
[im 55/255  lung]
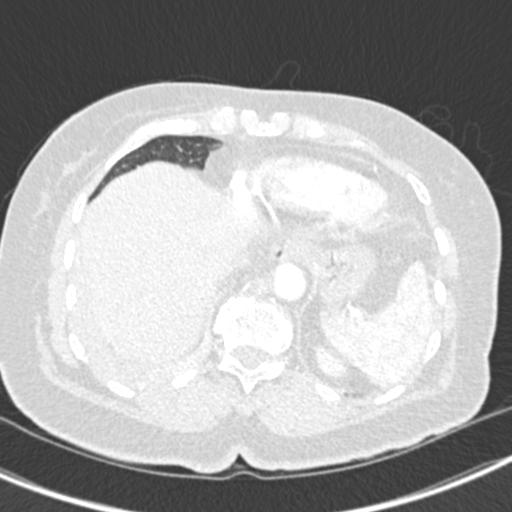
[im 73/255  mediastinal]
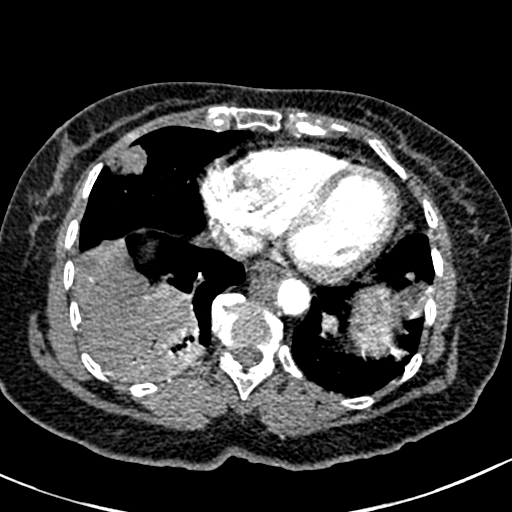
[im 91/255  lung]
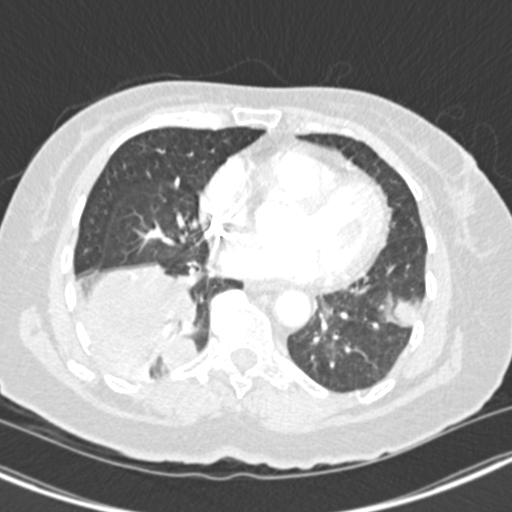
[im 109/255  mediastinal]
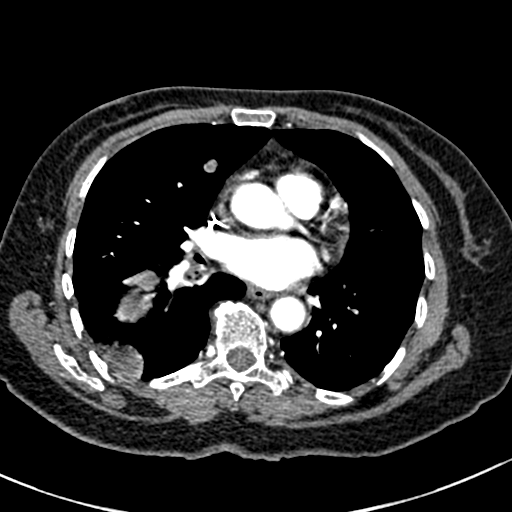
[im 128/255  lung]
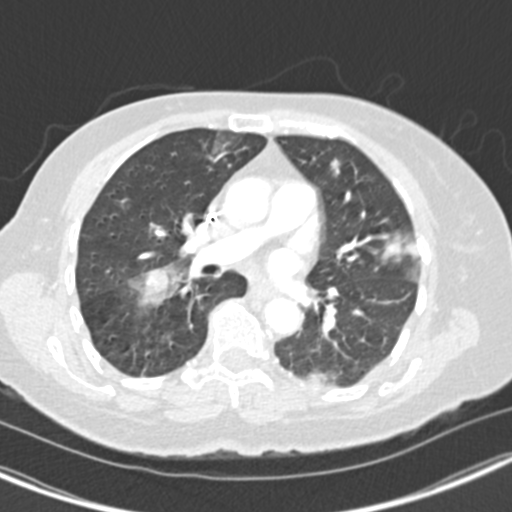
[im 146/255  mediastinal]
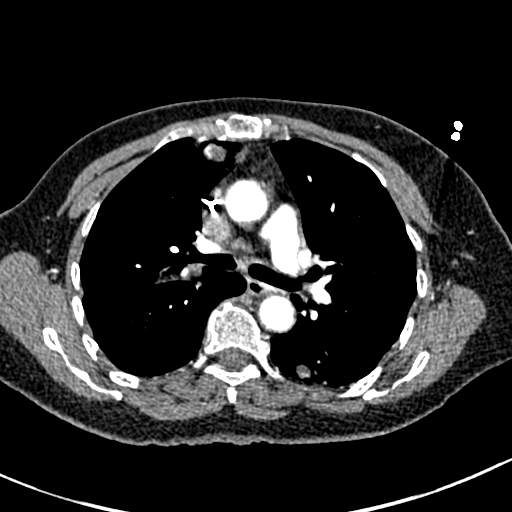
[im 164/255  lung]
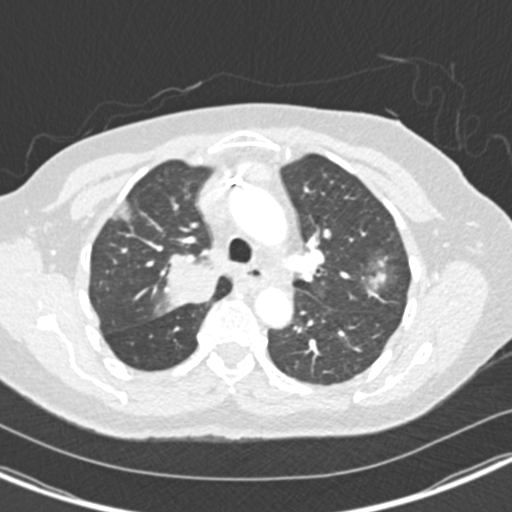
[im 182/255  mediastinal]
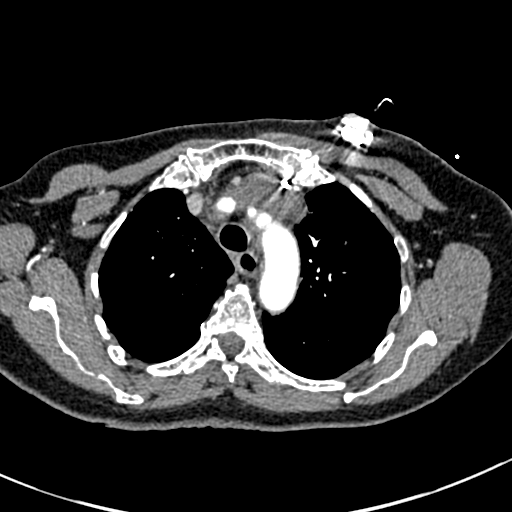
[im 200/255  lung]
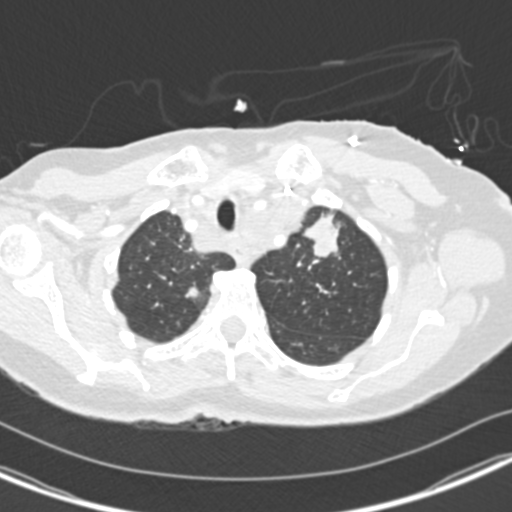
[im 218/255  mediastinal]
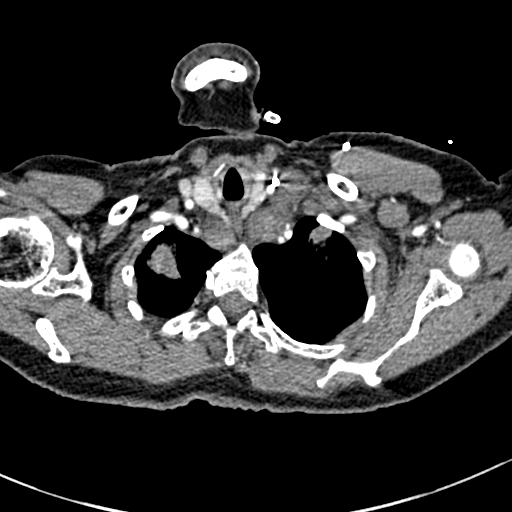
[im 236/255  lung]
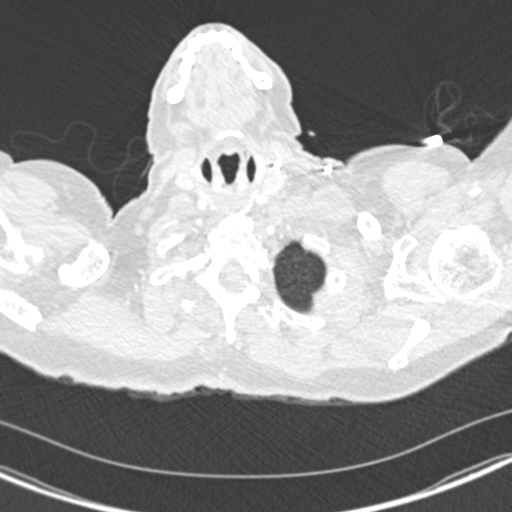

[Series 6: coronal mpr · coronal · 0.50mm/px · 1 of 118 slices shown]
[im 59/118  mediastinal]
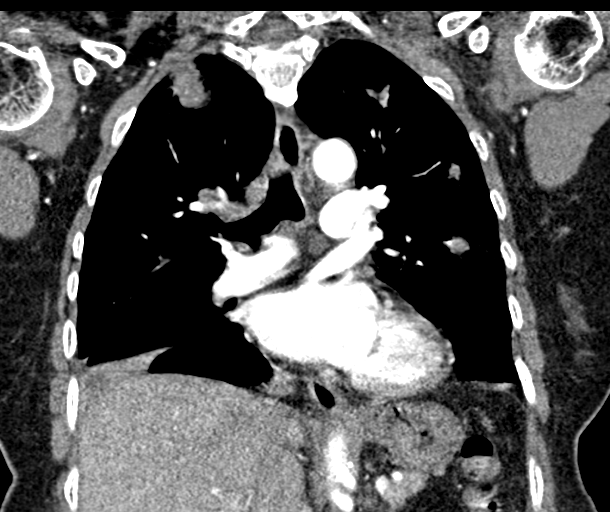

[Series 10: lung · axial · 0.53mm/px · z∈[+1302,+1388]mm · 3 of 109 slices shown]
[im 22/109  mediastinal]
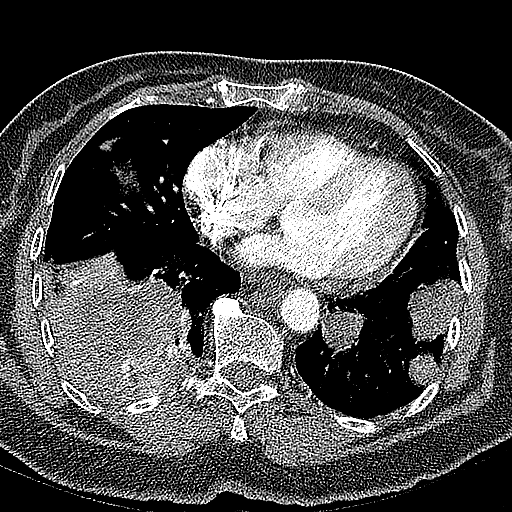
[im 44/109  mediastinal]
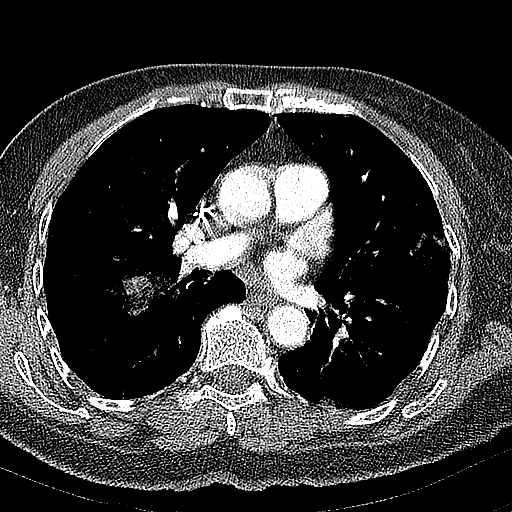
[im 65/109  mediastinal]
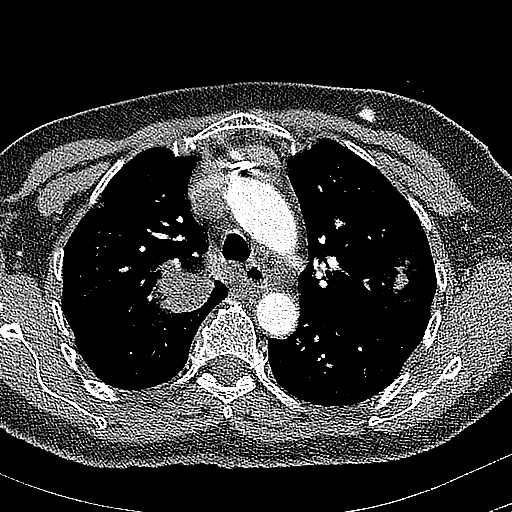

[17 of 36 positions shown; findings below may reference images not displayed]

RADIATION DOSE REDUCTION: This exam was performed according to the
departmental dose-optimization program which includes automated
exposure control, adjustment of the mA and/or kV according to
patient size and/or use of iterative reconstruction technique.

CONTRAST:  75mL OMNIPAQUE IOHEXOL 350 MG/ML SOLN
FINDINGS: Comment: Today's study is limited by considerable patient
respiratory motion.

Cardiovascular: There are no central, lobar or segmental sized
filling defects within the pulmonary arterial tree to suggest
pulmonary embolism. Smaller distal subsegmental sized emboli can not
be entirely excluded on the basis of today's examination. Heart size
is normal. There is no significant pericardial fluid, thickening or
pericardial calcification. There is aortic atherosclerosis, as well
as atherosclerosis of the great vessels of the mediastinum and the
coronary arteries, including calcified atherosclerotic plaque in the
left anterior descending coronary arteries. Left internal jugular
single-lumen porta cath with tip terminating in the right atrium.

Mediastinum/Nodes: There is extensive lymphadenopathy most evident
in the superior mediastinum and left supraclavicular region. The
largest left supraclavicular lymph nodes measure 2.2 cm in short
axis on axial images 5 and 12 of series 4. The largest superior
mediastinal nodal mass measures 5.1 x 2.3 cm (axial image 35 of
series 4) anterior to the origin of the great vessels. High right
paratracheal lymph node measuring 1.6 cm in short axis. Prominent
but nonenlarged bilateral hilar lymph nodes are noted. Middle
mediastinal lymphadenopathy measuring up to 2.2 cm in short axis
adjacent to the descending thoracic aorta (axial image 96 of series
4). Right retrocrural lymph node (axial image 109 of series 4)
measuring 1.9 cm in short axis. Esophagus is unremarkable in
appearance. No axillary lymphadenopathy.

Lungs/Pleura: Numerous pulmonary nodules and masses are noted
throughout the lungs bilaterally. The largest confluent mass or
conglomeration of masses is in the right lower lobe (axial image 90
of series 4) measuring 6.7 x 6.7 cm. Several of these lesions
demonstrate surrounding ground-glass attenuation, suggesting
perilesional hemorrhage. No pleural effusions.

Upper Abdomen: Unremarkable.

Musculoskeletal: There are no aggressive appearing lytic or blastic
lesions noted in the visualized portions of the skeleton.

Review of the MIP images confirms the above findings.
IMPRESSION: 1. Despite the mild limitations of today's examination, there is no
evidence to suggest clinically significant central, lobar or
segmental sized pulmonary embolism.
2. Widespread metastatic disease to the chest, including numerous
large pulmonary nodules and masses, many of which demonstrate
surrounding ground-glass attenuation suggestive of perilesional
alveolar hemorrhage (particularly given the patient's history of
hemoptysis). There is also extensive mediastinal and left
supraclavicular lymphadenopathy.
3. Aortic atherosclerosis, in addition to left anterior descending
coronary artery disease. Please note that although the presence of
coronary artery calcium documents the presence of coronary artery
disease, the severity of this disease and any potential stenosis
cannot be assessed on this non-gated CT examination. Assessment for
potential risk factor modification, dietary therapy or pharmacologic
therapy may be warranted, if clinically indicated.

Aortic Atherosclerosis ([O8]-[O8]).

## 2021-05-21 MED ORDER — TRANEXAMIC ACID FOR INHALATION
500.0000 mg | Freq: Three times a day (TID) | RESPIRATORY_TRACT | Status: AC
  Administered 2021-05-21 – 2021-05-23 (×7): 500 mg via RESPIRATORY_TRACT
  Filled 2021-05-21 (×7): qty 10

## 2021-05-21 MED ORDER — LIDOCAINE HCL (PF) 1 % IJ SOLN
5.0000 mL | Freq: Once | INTRAMUSCULAR | Status: AC
Start: 2021-05-21 — End: 2021-05-21
  Administered 2021-05-21: 5 mL
  Filled 2021-05-21: qty 5

## 2021-05-21 MED ORDER — DEXTROSE IN LACTATED RINGERS 5 % IV SOLN
INTRAVENOUS | Status: DC
Start: 2021-05-21 — End: 2021-05-23

## 2021-05-21 MED ORDER — MIDAZOLAM HCL 2 MG/2ML IJ SOLN
1.0000 mg | INTRAMUSCULAR | Status: DC | PRN
  Administered 2021-05-21: 2 mg via INTRAVENOUS
  Filled 2021-05-21 (×2): qty 2

## 2021-05-21 MED ORDER — FENTANYL CITRATE PF 50 MCG/ML IJ SOSY
25.0000 ug | PREFILLED_SYRINGE | INTRAMUSCULAR | Status: DC | PRN
  Administered 2021-05-21: 50 ug via INTRAVENOUS
  Filled 2021-05-21 (×2): qty 1

## 2021-05-21 MED ORDER — SODIUM BICARBONATE 8.4 % IV SOLN
INTRAVENOUS | Status: AC
  Filled 2021-05-21: qty 100

## 2021-05-21 MED ORDER — FAMOTIDINE 20 MG PO TABS
20.0000 mg | ORAL_TABLET | Freq: Every day | ORAL | Status: DC
  Administered 2021-05-22 – 2021-06-02 (×12): 20 mg via ORAL
  Filled 2021-05-21 (×12): qty 1

## 2021-05-21 MED ORDER — TRANEXAMIC ACID FOR INHALATION: 500.0000 mg | Freq: Three times a day (TID) | RESPIRATORY_TRACT | Status: DC

## 2021-05-21 MED ORDER — LIDOCAINE HCL 2 % IJ SOLN
INTRAMUSCULAR | Status: AC
  Administered 2021-05-21: 60 mg
  Filled 2021-05-21: qty 20

## 2021-05-21 MED ORDER — SODIUM CHLORIDE 0.9% IV SOLUTION: Freq: Once | INTRAVENOUS | Status: AC

## 2021-05-21 MED ORDER — LATANOPROST 0.005 % OP SOLN
1.0000 [drp] | Freq: Every day | OPHTHALMIC | Status: DC
  Administered 2021-05-21 – 2021-06-01 (×12): 1 [drp] via OPHTHALMIC
  Filled 2021-05-21: qty 2.5

## 2021-05-21 MED ORDER — HYDROCODONE BIT-HOMATROP MBR 5-1.5 MG/5ML PO SOLN
5.0000 mL | Freq: Three times a day (TID) | ORAL | Status: DC
  Administered 2021-05-21 – 2021-05-26 (×17): 5 mL via ORAL
  Filled 2021-05-21 (×18): qty 5

## 2021-05-21 MED ORDER — IOHEXOL 350 MG/ML SOLN
75.0000 mL | Freq: Once | INTRAVENOUS | Status: AC | PRN
  Administered 2021-05-21: 75 mL via INTRAVENOUS

## 2021-05-21 MED ORDER — HYDROCODONE BIT-HOMATROP MBR 5-1.5 MG/5ML PO SOLN
5.0000 mL | Freq: Two times a day (BID) | ORAL | Status: DC
Start: 2021-05-21 — End: 2021-05-21

## 2021-05-21 NOTE — Op Note (Addendum)
Name:  [name] ?MRN:  [id number] ?DOB:  25-Feb-1951 ? ?PROCEDURE NOTE ? ?Procedure(s): ?Flexible bronchoscopy (26834) ? ? ?Indications:  hemoptysis , pulmonary mets CT-scan of the chestwith bronchus intermedius  and large RLL lesion and other pulmonary mets ? ?Consent:  Procedure, benefits, risks and alternatives discussed.  Questions answered.  Consent obtained. ? ?Anesthesia:  Moderate Sedation using ICU white slim scope ? ?Location: bed [address] ? ?Procedure summary:  Appropriate equipment was assembled.  The patient was brought to the procedure suite room and identified as Kristen Freeman with [birth year] ? Safety timeout was performed. The patient was placed supine on the  table, airway and moderate sedation administered by this operator.  Patient got nebulized lidocaone and tranexamic acid neb pre procedure ? ?After the appropriate level of moderation was assured, flexible video bronchoscope was lubricated and inserted through the left naries Total of 6 mL of 2% Lidocaine were administered through the bronchoscope to augment moderate sedation ? ?Airway examination was YES performed bilaterally to subsegmental level.  Minimal clear secretions were noted, mucosa appeared normal and YES endobronchial lesions were identified in Right bronchus intermedius. The entire right bronchus intermedius is blocked off with tumor and clot overlay. Seemed it could bleed easily ? ? ? ?After hemostasis was assure, the bronchoscope was withdrawn. ? ?The patient was allowed to recover in ICU bed ? ?Post-procedure chest x-ray was NOT ordered. ? ?Specimens sent: ?NONE ? ?Complications:  No immediate complications were noted.  Hemodynamic parameters and oxygenation remained stable throughout the procedure. ? ?Estimated blood loss:  NONE ? ?IMPRESSION ?1. 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 Blocking entire distal airay into RML and RLL ?2. Probably no role for IR eomboization ? ?Followup ?Ccm rounds 05/22/21.  ?D.w Dr  Valeta Harms - > recommends continued TXA Neb, Needs RAD-ONC consutl for radiation to control bleeding and [place] debulking either as opd OR sooner if RLL collapses ? ?Dr. Brand Males, M.D., F.C.C.P ?Pulmonary and Critical Care Medicine ?Staff Physician ?[facility] ?[facility] ?Pager: [phone], If no answer or between  15:00h - 7:00h: call [phone] ? ?05/21/2021 ?6:24 PM ? ? ? ? ?

## 2021-05-21 NOTE — Progress Notes (Signed)
Pt continues to cough up thick blood. 30cc in last 30 minutes. ?MD put in orders. ?Pt leaving room now with her nurse to go to STAT CT scan. ?Pt is alert and oriented. Son at bedside. ?

## 2021-05-21 NOTE — Progress Notes (Signed)
Pt also awoke to movable swollen area at left upper chest at clavicle area. ?Pt states is new. MD aware per pt's nurse. ?

## 2021-05-21 NOTE — Progress Notes (Signed)
. ?  Transition of Care (TOC) Screening Note ? ? ?Patient Details  ?Name: Kristen Freeman ?Date of Birth: November 20, 1950 ? ? ?Transition of Care (TOC) CM/SW Contact:    ?Arlie Solomons Safwan Tomei, LCSW ?Phone Number: ?05/21/2021, 9:47 AM ? ? ? ?Transition of Care Department [facility]) has reviewed patient and no TOC needs have been identified at this time. We will continue to monitor patient advancement through interdisciplinary progression rounds. If new patient transition needs arise, please place a TOC consult. ?  ?

## 2021-05-21 NOTE — Consult Note (Addendum)
? ?NAME:  Kristen Freeman, MRN:  [id number], DOB:  03-06-1950, LOS: 2 ?ADMISSION DATE:  05/19/2021, CONSULTATION DATE:  05/20/21 ?REFERRING MD:  Dr Antonieta Pert fo [facility], CHIEF COMPLAINT:  Hemoptysis  ? ? ?brief   ?71 year old female who lives in [place].  Diagnosed with endometrial cancer and other specified 2019 and Lovenox [place].  Was under the care of Juluis Rainier.  She says she got some chemotherapy and was under remission.  Then in December 2022 she had recurrence in the lymphadenopathy in the abdomen.  Status post radiation and chemo.  Was doing well till 05/17/2021 when she had small amount of hemoptysis early in the morning on Wednesday.  Then repeat hemoptysis on 05/18/2021 Thursday.  Then again repeat small amount on 05/19/2021 Friday.  This time was a little more and there was a blood clot.  She then went to the emergency department at [facility].  She had a CT chest [images are not available they did not send the CD-ROM].  But this showed a mass and therefore she has been referred here.  She is here for the hemoptysis and also she wants to transfer her oncology care to [facility] health.  She tells me that Dr. Beatrix Fetters is relocating and is not going to be available anymore.  Apparently in the ER they gave her platelets for a platelet count of 59,000.  Since then has been no further hemoptysis.  She has not been collecting her hemoptysis any sputum cup. ? ?According to the hospitalist the CT chest showed pulmonary met metastasis.  There is one mass 7.6 cm with bulky adenopathy.  Chest x-ray clear shows bilateral lower lobe density right greater than left Largest is 6.8 cm.  Looks smooth ? ?Past Medical History:  ? ? has a past medical history of Endometrial cancer ([place]), Glaucoma, HLD (hyperlipidemia), and HTN (hypertension). ? ? has a past surgical history that includes Total knee arthroplasty (Left); Tonsillectomy; Total abdominal hysterectomy w/ bilateral salpingoophorectomy; and TM repair  (N/A). ? ? ? ?Significant Hospital Events:  ?05/19/2021 - admit ? ?Interim History / Subjective:  ? ?05/21/2021 - rapid enlaregemt of left supracalv node to mass this morning. Rrecurrence of hemoptysis t his mornning. Total volume approx 10-25cc. More than pre-admit but still small volume. 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 Endobronchial infiltration) v RLL. Platelets worse 66K ? ? ?Objective   ?Blood pressure 104/67, pulse 99, temperature 98.7 ?F (37.1 ?C), temperature source Oral, resp. rate 16, SpO2 99 %. ?   ?   ? ?Intake/Output Summary (Last 24 hours) at 05/21/2021 0929 ?Last data filed at 05/20/2021 0930 ?Gross per 24 hour  ?Intake 240 ml  ?Output --  ?Net 240 ml  ? ?There were no vitals filed for this visit. ? ?Examination: ?General Appearance:  Looks  very stable. Sitting in bed 1225. On o2. Talking to daughter ?Head:  Normocephalic, without obvious abnormality, atraumatic ?Eyes:  PERRL - yes, conjunctiva/corneas - muddy     ?Ears:  Normal external ear canals, both ears ?Nose:  G tube - No bu has [place] ?Throat:  ETT TUBE - no , OG tube - no ?Neck:  Supple,   NEW LARGE LEFT SUPRCLAVV MASS ?Lungs: Clear to auscultation bilaterally, ?Heart:  S1 and S2 normal, no murmur, CVP - no.  Pressors - no ?Abdomen:  Soft, no masses, no organomegaly ?Genitalia / Rectal:  Not done ?Extremities:  Extremities- intact ?Skin:  ntact in exposed areas . Sacral  area - not examined ?Neurologic:  Sedation - none -> RASS - +1 . Moves all 4s - yes. CAM-ICU - neg . Orientation - x3+ ? ? ? ? ?Resolved Hospital Problem list   ?x ? 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 ?05/21/2021 ->  recurrence this morning - more pronounced but still small volume . At high risk for progression and life threatening bleed. CTA suggests RT sided bleed either endobronchial lesion  or RLL mass  ? ?P:   ?Measure hemoptysis in sputum cup/bag [gave verbal instructions to nursing]  ?Suppres cough with cough syrup - hycodan tid ?Tranexamic acid neb q8h x 48h ?O2 for comfort ?Platelet correction (see below) ?Anemia correction (see below) ?Check for vasculitis and CTD 05/21/21 ? ?NPO except meds and sip ?Plan flexible video bronch 05/22/21 for airway exam to isolate site of bleed -> consider laser (? CVTS does it) if applicable ? - d/w Dr Londell Moh - pccm ?If massive bleed ? - intubate emergently with large ET tube -> bronch -> IR consult ? - emergent eombolization (d/w Dr Mir or IR; very low success rate ) ? ? ?Thrombocytopenia at admit ? ?3/26 - worse an din 60s (similar to ER at [facility]) ? ?Pl;an ?- platelet transfusion 05/21/2021 ? ? ?Anemia at admit ? ?3/26 - hgb 7.6 ? ?Plan ? - - PRBC for hgb </= 6.9gm%   ? - exceptions are ?  -  if ACS susepcted/confirmed then transfuse for hgb </= 8.0gm%,  or  ?  -  active bleeding with hemodynamic instability, then transfuse regardless of hemoglobin value ?  At at all times try to transfuse 1 unit prbc as possible with exception of active hemorrhage ? ? ?Endometrial Cancer - with pulmonary mets - Prior to & Present on Admit ? ?3/26 ? Worsening fast. ? ?Plan ? - Dr Maren [place] of [facility] d/w Dr Chryl Heck of Onc - > will have GYN onc Dr Alvy Bimler eval 05/22/21 ? ?Best practice (daily eval):  ?According to the hospitalist ? ?Goals of Care:  ? ? ?Family Updates: Family updated 05/20/21. On 05/21/21 -daughter at bedside and patient counseled about poor prognosis  ? ? ? ? ?[place]  ? ?The patient Loreda Silverio is critically ill with multiple organ systems failure and requires high complexity decision making for assessment and support, frequent evaluation and titration of therapies, application of advanced monitoring technologies and extensive interpretation of multiple databases.  ? ?Critical Care Time devoted to patient care services described in this note is   45  Minutes. This time reflects time of care of this signee Dr Brand Males. This critical care time does not reflect procedure time, or teaching time or supervisory time of PA/NP/Med student/Med Resident etc but could involve care discussion time  ? ? ? ?Dr. Brand Males, M.D., F.C.C.P ?Pulmonary and Critical Care Medicine ?Staff Physician ?[facility] ?[facility] ?Pager: [phone], If no answer or between  15:00h - 7:00h: call [phone] ? ?05/21/2021 ?10:11 AM ? ? ? ? ?SIGNATURE  ? ? ?Dr. Brand Males, M.D., F.C.C.P,  ?Pulmonary and Critical Care Medicine ?Staff Physician, [place] ?Center Director - Interstitial Lung Disease  Program  ?[facility] [place] [facility] ?[place], [place], [address] ? ?NPI Number:  NPI #[id number] ? ?Pager: [phone], If  no answer  -> Check AMION or Try [phone] ?Telephone (clinical office): [phone] ?Telephone (research): [phone] ? ?9:29 AM ?05/21/2021 ? ? ?05/21/2021 ?9:29 AM ? ? ? ?LABS  ? ? ?PULMONARY ?No results for input(s): PHART, PCO2ART, PO2ART, HCO3, TCO2, O2SAT in the last 168 hours. ? ?Invalid input(s): PCO2, PO2 ? ?CBC ?Recent Labs  ?Lab 05/20/21 ?0452 05/21/21 ?0401  ?HGB 7.8* 7.6*  ?HCT 23.5* 23.6*  ?WBC 4.9 4.9  ?PLT 80* 66*  ? ? ?COAGULATION ?No results for input(s): INR in the last 168 hours. ? ?CARDIAC ? No results for input(s): TROPONINI in the last 168 hours. ?No results for input(s): PROBNP in the last 168 hours. ? ?CHEMISTRY ?Recent Labs  ?Lab 05/20/21 ?0452 05/21/21 ?0401  ?NA 138 136  ?K 3.4* 3.6  ?CL 106 106  ?CO2 25 24  ?GLUCOSE 122* 127*  ?BUN 15 14  ?CREATININE 0.72 0.64  ?CALCIUM 8.7* 8.7*  ? ?CrCl cannot be calculated (Unknown ideal weight.). ? ? ?LIVER ?No results for input(s): AST, ALT, ALKPHOS, BILITOT, PROT, ALBUMIN, INR in the last 168 hours. ? ? ?INFECTIOUS ?No results for input(s): LATICACIDVEN, PROCALCITON in the last 168  hours. ? ? ?ENDOCRINE ?CBG (last 3)  ?No results for input(s): GLUCAP in the last 72 hours. ? ? ? ? ? ? ?IMAGING x48h  - image(s) personally visualized  -   highlighted in bold ?CT Angio Chest Pulmonary Embolism (PE) W or

## 2021-05-21 NOTE — Progress Notes (Signed)
RT helped assist MD with bedside bronch. ?

## 2021-05-21 NOTE — Progress Notes (Signed)
?PROGRESS NOTE ?Kristen Freeman  DUK:[id number] DOB: 1950-04-15 DOA: 05/19/2021 ?PCP: Allie Dimmer, MD  ? ?Brief Narrative/Hospital Course: ?71 year old female with history of endometrial cancer in 2019 and in remission until Dec '22 when she was found to have recurrence, underwent XRT and chemo completing treatment 04/25/21. She had an episode of hemoptysis 05/17/21. She sought care at ED in [place]. She was found to have thrombocytopenia and did had platelet transfusion. Other Lab - Glucose 115, INR 1.1, Hgb 8.4, WBC 4.4, Plt 59, nl Diff. CTA chest - increased pulmonary mets: multiple large nodules. ? Post-obstructive consolidation with nodule 7.6x3.2x2 cm. Bulky adenopathy.EKG - nl.  She was subsequently transferred to [facility] for further evaluation and management.   ?  ?Subjective: ?Patient seen urgently this am for continuous hemoptysis ?On exam she has sputum mixed with blood,she had about 30 mL collection so far ?Patient also noticed lump on Left supraclavicle area this am-painless unaware how long she had that for. ? ?Assessment and Plan: ?Principal Problem: ?  Hemoptysis ?Active Problems: ?  Endometrial cancer ([place]) ?  HTN (hypertension) ?  HLD (hyperlipidemia) ?  Glaucoma ?  Normocytic anemia ?  Iron deficiency anemia ?  Thrombocytopenia ([place]) ?  ?Hemoptysis-mild intermittent since 3/22 ?Pulmonary metastatic disease: ?Hemoptysis recurrent this morning more pronounced-due to patient's pulmonary metastatic disease.  Stat CT at this morning no PE, widespread metastatic disease to the chest with numerous large pulmonary nodules and masses with surrounding groundglass attenuation/stable perilesional alveolar hemorrhage also extensive mediastinal and left supraclavicular lymphadenopathy.  The largest confluent mass or conglomeration of masses in the right lower lobe 6.7 x 6.7 cm  apparently in CT in [place] had largest mass 7.6 X3.2X 2 cm. ?Urgently transferred to ICU for close monitoring in  case patient needs intubation.  IR Dr Mir has been consulted in case she needs embolization.  Antitussives, keep n.p.o. except meds and sips, transfusing platelet to keep above 75 K, hemoglobin is stable, pulmonary getting vasculitis and other labs.INR 1.3 now.patient Dr. Chryl Heck from oncology has been consulted and spoke w/ DR Chryl Heck- also discussed with GYN oncology Dr. Marzella Schlein- advise plat correction >75k and to be seen by Dr Alvy Bimler gyn onc tomorrow, Dr Chryl Heck will arrange for rad on to see tomorrow. ?  ?Endometrial cancer diagnosed 2019 in remission until December 22 and had XRTx 30 treatment 03/21/21, chemo through 04/25/21.  CTA shows multiple pulmonary metastatic/masses , also has a rapidly growing left supraclavicular lymph node.  Hem-onc been consulted as above ? ?Thrombocytopenia- plt was 59 at outside ED-s/p 1 unit. Plt downtrending ordered 1 unit platelet transfusion due to hematemesis.  Monitor  ?Recent Labs  ?Lab 05/20/21 ?0452 05/21/21 ?0401  ?PLT 80* 66*  ?  ? 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  ?Hypokalemia- resolved ?Glaucoma-continue home eyedrops ?Hyperlipidemia-holding statin.   ?Hypertension: BP stable, will hold patient's HCTZ/metoprolol for now. ?Goals of care: Currently full code given patient's recurrent endometrial cancer with multiple pulmonary metastasis-overall prognosis remains guarded/not bright.  Family informed about metastasis if it gets massive could be life-threatening so moved to stepdown/ICU area IR PCCM and hematology oncology has been consulted and discussed extensively this morning.  Family including son daughter and husband has been updated.Continue to monitor closely ? ?DVT prophylaxis: Place TED hose Start: 05/20/21 0015 ?Code Status:   Code Status: Full Code ?Family Communication: plan of care discussed with patient/son at bedside. ?  Patient status is: inaptient  Level of care: Stepdown  ?Remains inpatient because: Ongoing management of hemoptysis ?Patient currently not stable ? ?Dispo: The patient is from: home ?           Anticipated disposition: home ? ?Mobility Assessment (last 72 hours)   ? ? Mobility Assessment   ? ? [place] Name 05/20/21 1753 05/19/21 2100  ?  ?  ?  ? Does patient have an order for bedrest or is patient medically unstable No - Continue assessment No - Continue assessment     ? What is the highest level of mobility based on the progressive mobility assessment? Level 5 (Walks with assist in room/hall) - Balance while stepping forward/back and can walk in room with assist - Complete Level 5 (Walks with assist in room/hall) - Balance while stepping forward/back and can walk in room with assist - Complete     ? ?  ?  ? ?  ?  ? ?Objective: ?Vitals last 24 hrs: ?Vitals:  ? 05/20/21 2020 05/21/21 0434 05/21/21 0900 05/21/21 1000  ?BP: 103/70 104/67 123/73 134/70  ?Pulse: 97 99 (!) 105 (!) 109  ?Resp: 20 16 (!) 22 (!) 21  ?Temp: 99.5 ?F (37.5 ?C) 98.7 ?F (37.1 ?C)    ?TempSrc: Oral Oral    ?SpO2: 99% 99% 100% 100%  ? ?Weight change:  ? ?Physical Examination: ?General exam: AA0X3,older than stated age, weak appearing. ?HEENT:Oral mucosa moist, Ear/Nose WNL grossly, dentition normal. ?Respiratory system: bilaterally diminished at bases, clear upper airways, no use of accessory muscle ?Cardiovascular system: S1 & S2 +, No JVD,. ?Gastrointestinal system: Abdomen soft,NT,ND,BS+ ?Nervous System:Alert, awake, moving extremities and grossly nonfocal ?Extremities: LE ankle edema none, distal peripheral pulses palpable.  ?Skin: No rashes,no icterus. ?MSK: Normal muscle bulk,tone, power  ? ?Medications reviewed:  ?Scheduled Meds: ? sodium chloride   Intravenous Once  ? Chlorhexidine Gluconate Cloth  6 each Topical Daily  ? HYDROcodone bit-homatropine  5 mL Oral TID  ? senna  1 tablet Oral BID  ? sodium chloride flush  10-40 mL Intracatheter Q12H  ? sodium chloride flush  3  mL Intravenous Q12H  ? ?Continuous Infusions: ? sodium chloride    ? dextrose 5% lactated ringers    ? ? ?  ?Diet Order   ? ?       ?  Diet NPO time specified Except for: Sips with Meds  Diet effective ____       ?  ? ?  ?  ? ?  ?  ? ?  ?  ?  ? ? ?Intake/Output Summary (Last 24 hours) at 05/21/2021 1057 ?Last data filed at 05/21/2021 0900 ?Gross per 24 hour  ?Intake 0 ml  ?Output --  ?Net 0 ml  ? ?Net IO Since Admission: 240 mL [05/21/21 1057]  ?Wt Readings from Last 3 Encounters:  ?No data found for Wt  ?  ? ?Unresulted Labs (From admission, onward)  ? ?  Start     Ordered  ? 05/22/21 6967  Basic metabolic panel  Daily,   R     ?Question:  Specimen collection method  Answer:  IV Team=IV Team collect  ? 05/21/21 0845  ? 05/22/21 0500  CBC  Daily,   R     ?Question:  Specimen collection method  Answer:  IV Team=IV Team collect  ? 05/21/21 0845  ? 05/21/21 1007  ANCA [place]  (Anti-Neutrophilic Cystoplasmic Antibody Panel (PNL))  Once,   R       ?  Question:  Specimen collection method  Answer:  Unit=Unit collect  ? 05/21/21 1006  ? 05/21/21 1007  Glomerular basement membrane antibodies  Once,   R       ?Question:  Specimen collection method  Answer:  Unit=Unit collect  ? 05/21/21 1006  ? 05/21/21 1007  Sedimentation rate  Once,   R       ?Question:  Specimen collection method  Answer:  Unit=Unit collect  ? 05/21/21 1006  ? 05/21/21 1007  ANA, IFA (with reflex)  Once,   R       ?Question:  Specimen collection method  Answer:  Unit=Unit collect  ? 05/21/21 1006  ? 05/21/21 1007  Anti-DNA antibody, double-stranded  Once,   R       ?Question:  Specimen collection method  Answer:  Unit=Unit collect  ? 05/21/21 1006  ? 05/21/21 1007  Rheumatoid factor  Once,   R       ?Question:  Specimen collection method  Answer:  Unit=Unit collect  ? 05/21/21 1006  ? 76/16/07 3710  CYCLIC CITRUL PEPTIDE ANTIBODY, IGG/IGA  Once,   R       ?Question:  Specimen collection method  Answer:  Unit=Unit collect  ? 05/21/21 1006  ? 05/21/21 1004   Magnesium  ONCE - STAT,   STAT       ?Question:  Specimen collection method  Answer:  Unit=Unit collect  ? 05/21/21 1004  ? 05/21/21 1004  Phosphorus  ONCE - STAT,   STAT       ?Question:  Specimen collectio

## 2021-05-21 NOTE — Progress Notes (Signed)
Patient presenting with new swollen bump on left upper anterior chest near clavicle. Also, patient is experiencing more hemoptysis. MD paged.  ?

## 2021-05-21 NOTE — Plan of Care (Signed)

## 2021-05-21 NOTE — Consult Note (Signed)
? ?Chief Complaint: ?Patient was seen in consultation today for pulmonary angiogram with possible catheter directed bronchial artery embolization secondary to hemoptysis ? ?Referring Physician(s): ?Ramaswamy,M ? ?Supervising Physician: Mir, Sharen Heck ? ?Patient Status: [facility] - In-pt ? ?History of Present Illness: ?Kristen Freeman is a 71 y.o. female with past medical history of endometrial cancer 2019 with prior chemoradiation received in [place] and recurrence in 2022 with abdominal lymphadenopathy as well as additional radiation therapy and chemotherapy completing treatment on 04/25/2021, glaucoma, hyperlipidemia and hypertension. She had an episode of hemoptysis on 05/17/2021 and sought care at the ED in [place].  She was noted to have thrombocytopenia and underwent platelet transfusion.  Further imaging revealed increased pulmonary mets with multiple large nodules as well as postobstructive consolidation and bulky adenopathy.  EKG was normal.  She was subsequently transferred to [facility] for further evaluation and management of recurrent hemoptysis.  CT angiogram of the chest today revealed: ? ?1. Despite the mild limitations of today's examination, there is no ?evidence to suggest clinically significant central, lobar or ?segmental sized pulmonary embolism. ?2. Widespread metastatic disease to the chest, including numerous ?large pulmonary nodules and masses, many of which demonstrate ?surrounding ground-glass attenuation suggestive of perilesional ?alveolar hemorrhage (particularly given the patient's history of ?hemoptysis). There is also extensive mediastinal and left ?supraclavicular lymphadenopathy. ?3. Aortic atherosclerosis, in addition to left anterior descending ?coronary artery disease. Please note that although the presence of ?coronary artery calcium documents the presence of coronary artery ?disease, the severity of this disease and any potential stenosis ?cannot be assessed on this  non-gated CT examination. Assessment for ?potential risk factor modification, dietary therapy or pharmacologic ?therapy may be warranted, if clinically indicated. ?  ?Aortic Atherosclerosis ? ?She is currently hemodynamically stable.  Latest labs include WBC 4.9, hemoglobin 7.6, platelets 66k-to receive additional platelets today; potassium 3.6, creatinine 1.64, PT 15.9, INR 1.3.  Patient reports only small amount of hemoptysis over last 24 hrs. Request now received from critical care team to evaluate patient for possible bronchial artery embolization. ? ? ?Past Medical History:  ?Diagnosis Date  ? Endometrial cancer ([place])   ? dx 2019 tx'd; recurrence 12/22 - XRT x 30, Chem 03/21/21 to 04/25/21  ? Glaucoma   ? HLD (hyperlipidemia)   ? on statin  ? HTN (hypertension)   ? ? ?Past Surgical History:  ?Procedure Laterality Date  ? TM repair N/A   ? TONSILLECTOMY    ? TOTAL ABDOMINAL HYSTERECTOMY W/ BILATERAL SALPINGOOPHORECTOMY    ? TOTAL KNEE ARTHROPLASTY Left   ? ? ?Allergies: ?Latex and Tape ? ?Medications: ?Prior to Admission medications   ?Medication Sig Start Date End Date Taking? Authorizing Provider  ?acetaminophen (TYLENOL) 500 MG tablet Take 500 mg by mouth every 6 (six) hours as needed for mild pain or headache.   Yes [provider]  ?atorvastatin (LIPITOR) 10 MG tablet Take 10 mg by mouth daily. 04/07/21  Yes [provider]  ?Calcium Carb-Cholecalciferol (CALCIUM + D3 PO) Take 1 tablet by mouth daily with breakfast.   Yes [provider]  ?famotidine (PEPCID) 20 MG tablet Take 20 mg by mouth daily as needed for heartburn or indigestion.   Yes [provider]  ?Glycerin-Polysorbate 80 (REFRESH DRY EYE THERAPY OP) Place 1 drop into both eyes 2 (two) times daily as needed (for dryness).   Yes [provider]  ?hydrochlorothiazide (MICROZIDE) 12.5 MG capsule Take 12.5 mg by mouth daily. 05/19/21  Yes [provider]  ?HYDROcodone-acetaminophen (NORCO/VICODIN)  5-325 MG tablet Take 1 tablet by mouth every 6 (six) hours as needed for pain. 03/19/21  Yes [provider]  ?latanoprost (XALATAN) 0.005 % ophthalmic solution Place 1 drop into both eyes at bedtime. 03/26/21  Yes [provider]  ?metoprolol tartrate (LOPRESSOR) 50 MG tablet Take 50 mg by mouth 2 (two) times daily. 05/01/21  Yes [provider]  ?MIRALAX 17 GM/SCOOP powder Take 17 g by mouth See admin instructions. Mix 17 grams of powder into the recommended amount of water and drink 1-2 times a week   Yes [provider]  ?Multiple Vitamins-Minerals (ONE-A-DAY PROACTIVE 65+) TABS Take 1 tablet by mouth daily with breakfast.   Yes [provider]  ?ondansetron (ZOFRAN) 4 MG tablet Take 4 mg by mouth 3 (three) times daily as needed for nausea/vomiting. 03/23/21  Yes [provider]  ?Potassium Chloride ER 20 MEQ TBCR Take 40 mEq by mouth daily. 04/06/21  Yes [provider]  ?prochlorperazine (COMPAZINE) 10 MG tablet Take 10 mg by mouth 4 (four) times daily as needed for nausea/vomiting. 03/21/21  Yes [provider]  ?triamcinolone cream (KENALOG) 0.1 % Apply 1 application. topically 2 (two) times daily. ?Patient not taking: Reported on 05/20/2021 02/22/21   [provider]  ?  ? ?Family History  ?Problem Relation Age of Onset  ? Alzheimer's disease Mother   ? Diabetes Father   ? Prostate cancer Father   ? Hypertension Sister   ? Diabetes Sister   ? Breast cancer Sister   ? Hypertension Sister   ? Diabetes Sister   ? CVA Brother   ? Alcoholism Brother   ? ? ?Social History  ? ?Socioeconomic History  ? Marital status: Married  ?  Spouse name: Not on file  ? Number of children: 3  ? Years of education: 60  ? Highest education level: Not on file  ?Occupational History  ? 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 Smoking status: Never  ? Smokeless tobacco: Never  ?Substance and Sexual Activity  ? Alcohol use: Not Currently  ?  Drug use: Never  ? Sexual activity: Not on file  ?Other Topics Concern  ? Not on file  ?Social History Narrative  ? Not on file  ? ?Social Determinants of Health  ? ?Financial Resource Strain: Not on file  ?Food Insecurity: Not on file  ?Transportation Needs: Not on file  ?Physical Activity: Not on file  ?Stress: Not on file  ?Social Connections: Not on file  ? ? ? ? ?Review of Systems SEE ABOVE;  currently denies fever, headache, chest pain, worsening dyspnea, abdominal/back pain, nausea, vomiting, hematuria or blood in stool. ? ?Vital Signs: ?BP 129/76   Pulse (!) 103   Temp 99 ?F (37.2 ?C) (Oral)   Resp (!) 21   SpO2 99%  ? ?Physical Exam awake, alert.  Family in room.  Chest clear to auscultation bilaterally.  Left chest wall Port-A-Cath in place.  Prominent left supraclavicular adenopathy.  Heart with regular rate and rhythm.  Abdomen soft, positive bowel sounds, nontender.  No lower extremity edema. ? ?Imaging: ?CT Angio Chest Pulmonary Embolism (PE) W or WO Contrast ? ?Result Date: 05/21/2021 ?CLINICAL DATA:  71 year old female with history of abnormal chest x-ray. Endometrial cancer. Hemoptysis. * Tracking Code: BO * EXAM: CT ANGIOGRAPHY CHEST WITH CONTRAST TECHNIQUE: Multidetector CT imaging of the chest was performed using the standard protocol during bolus administration of intravenous contrast. Multiplanar CT image  reconstructions and MIPs were obtained to evaluate the vascular anatomy. RADIATION DOSE REDUCTION: This exam was performed according to the departmental dose-optimization program which includes automated exposure control, adjustment of the mA and/or kV according to patient size and/or use of iterative reconstruction technique. CONTRAST:  62m OMNIPAQUE IOHEXOL 350 MG/ML SOLN COMPARISON:  No priors. FINDINGS: Comment: Today's study is limited by considerable patient respiratory motion. Cardiovascular: There are no central, lobar or segmental sized filling defects within the pulmonary  arterial tree to suggest pulmonary embolism. Smaller distal subsegmental sized emboli can not be entirely excluded on the basis of today's examination. Heart size is normal. There is no significant pericardial flui

## 2021-05-22 ENCOUNTER — Ambulatory Visit
Admit: 2021-05-22 | Discharge: 2021-05-22 | Disposition: A | Discharge status: Home / Self Care | Payer: Medicare Other | Attending: Radiation Oncology | Admitting: Radiation Oncology

## 2021-05-22 ENCOUNTER — Telehealth: Payer: Self-pay | Admitting: Oncology

## 2021-05-22 ENCOUNTER — Inpatient Hospital Stay (HOSPITAL_COMMUNITY): Payer: Medicare Other

## 2021-05-22 DIAGNOSIS — R042 Hemoptysis: Secondary | ICD-10-CM | POA: Diagnosis not present

## 2021-05-22 DIAGNOSIS — C541 Malignant neoplasm of endometrium: Secondary | ICD-10-CM

## 2021-05-22 DIAGNOSIS — Z7189 Other specified counseling: Secondary | ICD-10-CM

## 2021-05-22 DIAGNOSIS — C7801 Secondary malignant neoplasm of right lung: Secondary | ICD-10-CM

## 2021-05-22 DIAGNOSIS — C7800 Secondary malignant neoplasm of unspecified lung: Secondary | ICD-10-CM | POA: Insufficient documentation

## 2021-05-22 DIAGNOSIS — D62 Acute posthemorrhagic anemia: Secondary | ICD-10-CM | POA: Diagnosis not present

## 2021-05-22 DIAGNOSIS — C7802 Secondary malignant neoplasm of left lung: Secondary | ICD-10-CM

## 2021-05-22 DIAGNOSIS — R918 Other nonspecific abnormal finding of lung field: Secondary | ICD-10-CM | POA: Diagnosis not present

## 2021-05-22 LAB — CBC
HCT: 20.9 % — ABNORMAL LOW (ref 36.0–46.0)
Hemoglobin: 6.9 g/dL — CL (ref 12.0–15.0)
MCH: 36.1 pg — ABNORMAL HIGH (ref 26.0–34.0)
MCHC: 33 g/dL (ref 30.0–36.0)
MCV: 109.4 fL — ABNORMAL HIGH (ref 80.0–100.0)
Platelets: 80 10*3/uL — ABNORMAL LOW (ref 150–400)
RBC: 1.91 MIL/uL — ABNORMAL LOW (ref 3.87–5.11)
RDW: 17.1 % — ABNORMAL HIGH (ref 11.5–15.5)
WBC: 4.2 10*3/uL (ref 4.0–10.5)
nRBC: 0 % (ref 0.0–0.2)

## 2021-05-22 LAB — ANTI-DNA ANTIBODY, DOUBLE-STRANDED: ds DNA Ab: 1 IU/mL (ref 0–9)

## 2021-05-22 LAB — PREPARE PLATELET PHERESIS: Unit division: 0

## 2021-05-22 LAB — GLOMERULAR BASEMENT MEMBRANE ANTIBODIES: GBM Ab: <0.2 units (ref 0.0–0.9)

## 2021-05-22 LAB — BASIC METABOLIC PANEL
Anion gap: 5 (ref 5–15)
BUN: 11 mg/dL (ref 8–23)
CO2: 25 mmol/L (ref 22–32)
Calcium: 8.7 mg/dL — ABNORMAL LOW (ref 8.9–10.3)
Chloride: 110 mmol/L (ref 98–111)
Creatinine, Ser: 0.53 mg/dL (ref 0.44–1.00)
GFR, Estimated: >60 mL/min (ref >60)
Glucose, Bld: 131 mg/dL — ABNORMAL HIGH (ref 70–99)
Potassium: 3.6 mmol/L (ref 3.5–5.1)
Sodium: 140 mmol/L (ref 135–145)

## 2021-05-22 LAB — BPAM PLATELET PHERESIS
Blood Product Expiration Date: 202303272359
ISSUE DATE / TIME: 202303261151
Unit Type and Rh: 5100

## 2021-05-22 LAB — HEMOGLOBIN AND HEMATOCRIT, BLOOD
HCT: 26.5 % — ABNORMAL LOW (ref 36.0–46.0)
Hemoglobin: 8.8 g/dL — ABNORMAL LOW (ref 12.0–15.0)

## 2021-05-22 LAB — RHEUMATOID FACTOR: Rheumatoid fact SerPl-aCnc: <10 IU/mL (ref <14.0)

## 2021-05-22 LAB — CYCLIC CITRUL PEPTIDE ANTIBODY, IGG/IGA: CCP Antibodies IgG/IgA: 3 units (ref 0–19)

## 2021-05-22 LAB — PREPARE RBC (CROSSMATCH)

## 2021-05-22 IMAGING — CT CT ABD-PELV W/ CM
2 of 5 series · 14 of 46 positions shown, 16 images · IV contrast (APPLIED)
Comparison: Chest CT [DATE]
COMPARISON: Chest CT [DATE]

Addendum:
CLINICAL DATA: Endometrial carcinoma.  Lung metastasis.

EXAM:
CT ABDOMEN AND PELVIS WITH CONTRAST
TECHNIQUE: Multidetector CT imaging of the abdomen and pelvis was performed
using the standard protocol following bolus administration of
intravenous contrast.

[Series 2: axial st · axial · 0.88mm/px · z∈[-696,-356]mm · 11 of 82 slices shown, 13 images]
[im 7/82  soft-tissue]
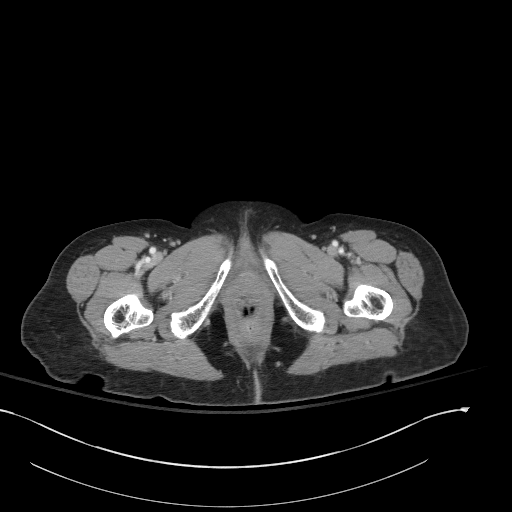
[im 7/82  bone]
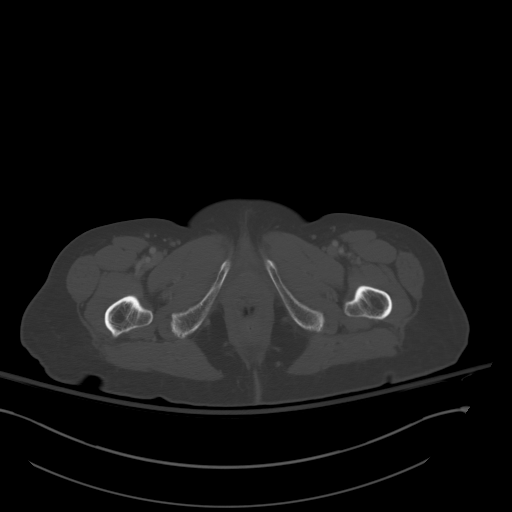
[im 13/82  soft-tissue]
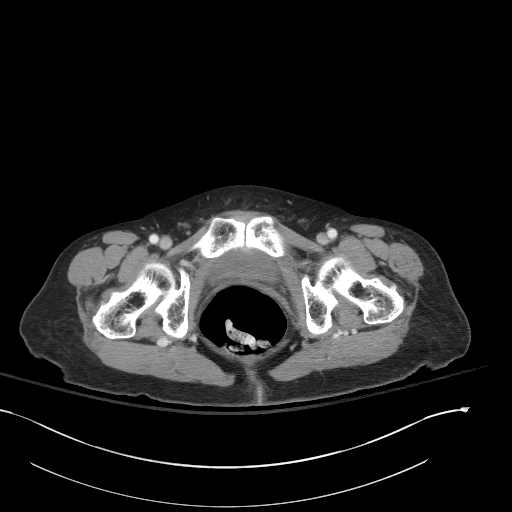
[im 19/82  soft-tissue]
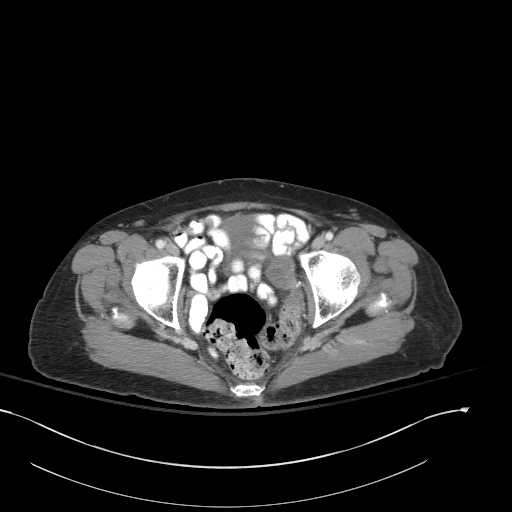
[im 25/82  soft-tissue]
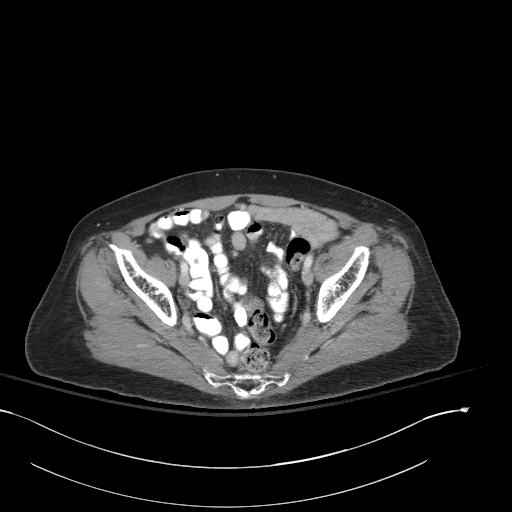
[im 32/82  soft-tissue]
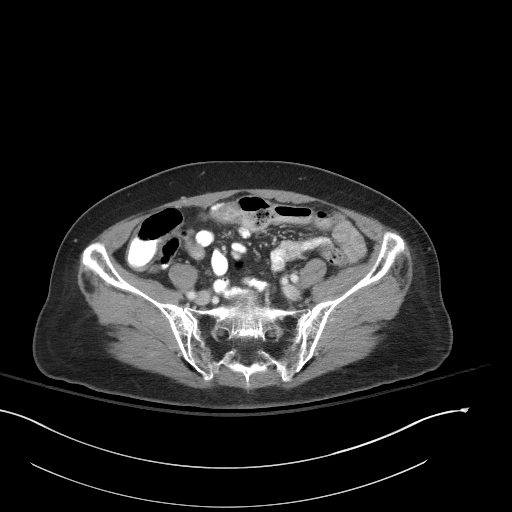
[im 44/82  soft-tissue]
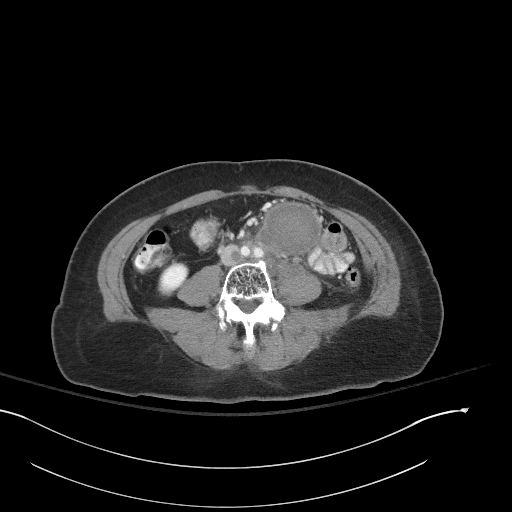
[im 50/82  soft-tissue]
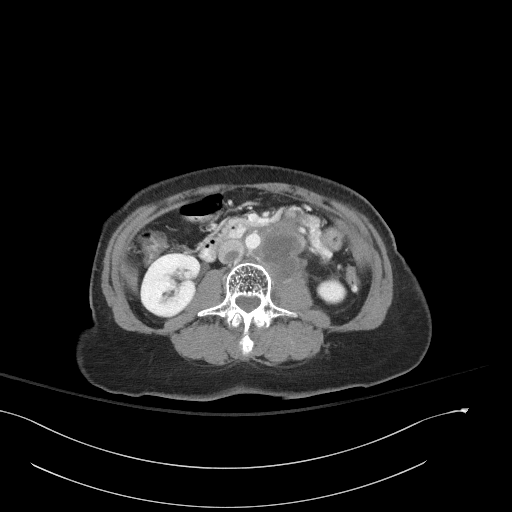
[im 57/82  soft-tissue]
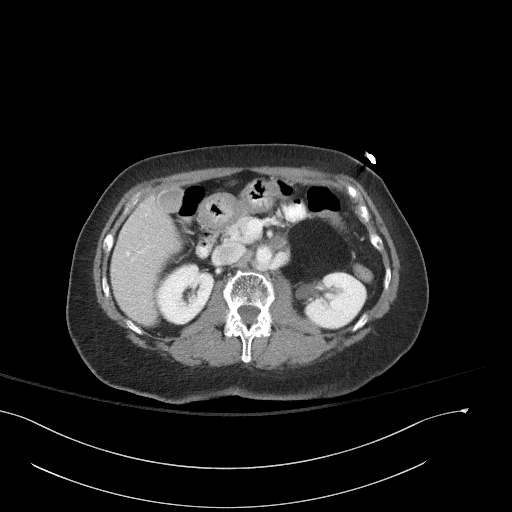
[im 63/82  soft-tissue]
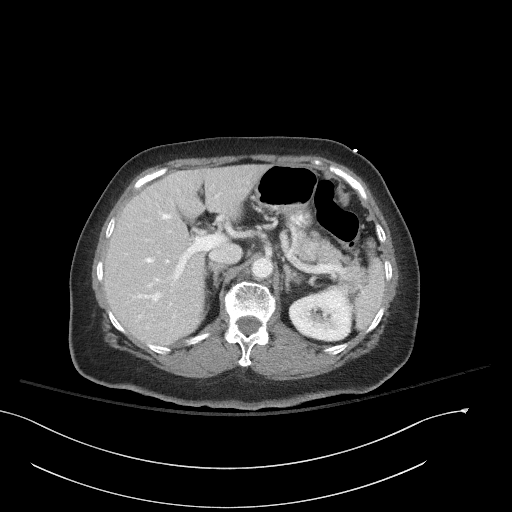
[im 63/82  bone]
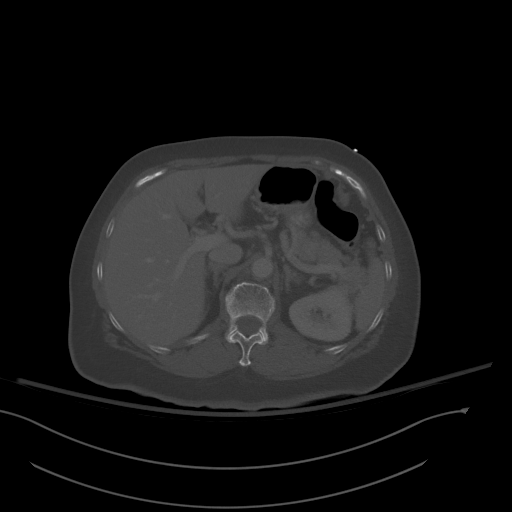
[im 69/82  soft-tissue]
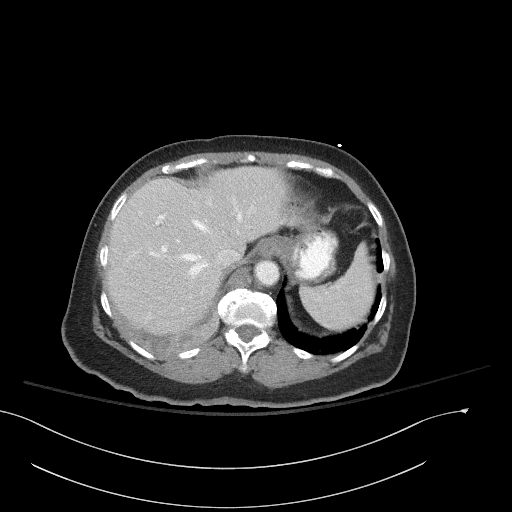
[im 75/82  soft-tissue]
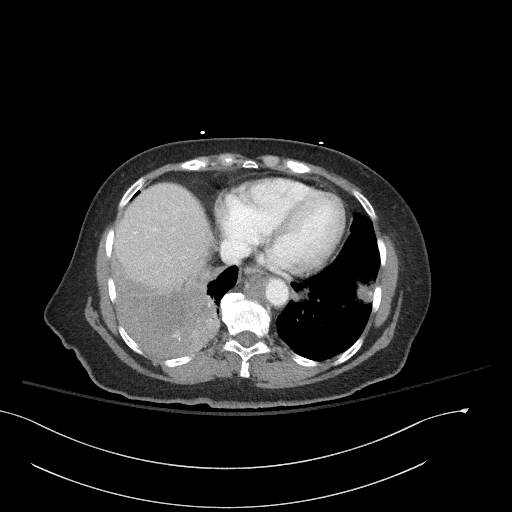

[Series 5: coronal st · coronal · 0.81mm/px · 3 of 101 slices shown]
[im 34/101  soft-tissue]
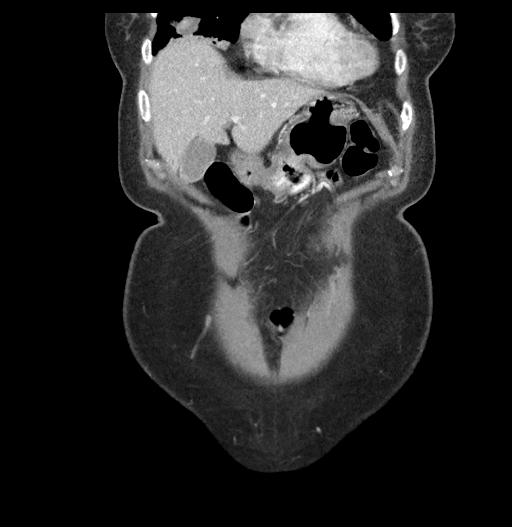
[im 45/101  soft-tissue]
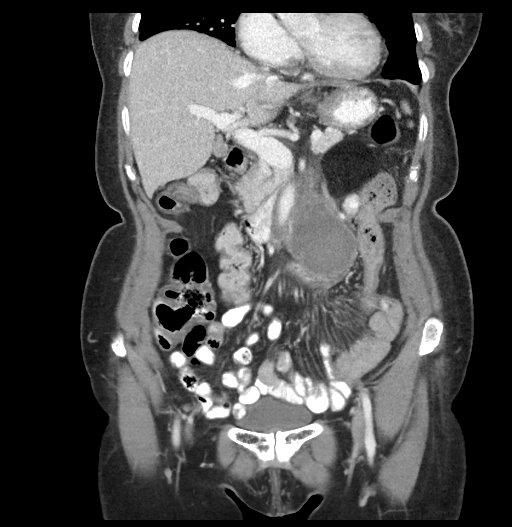
[im 56/101  soft-tissue]
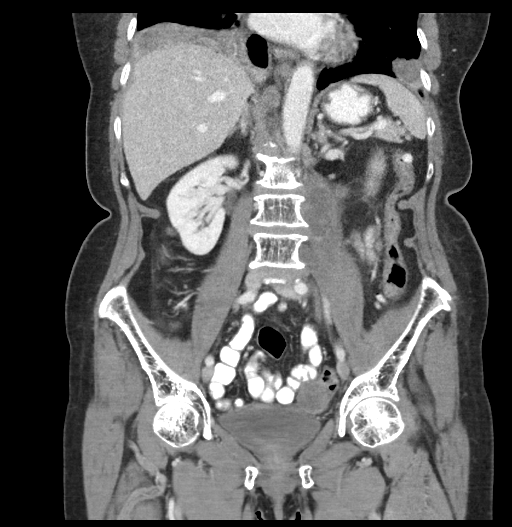

[14 of 46 positions shown; findings below may reference images not displayed]

RADIATION DOSE REDUCTION: This exam was performed according to the
departmental dose-optimization program which includes automated
exposure control, adjustment of the mA and/or kV according to
patient size and/or use of iterative reconstruction technique.

CONTRAST:  100mL OMNIPAQUE IOHEXOL 300 MG/ML  SOLN
FINDINGS: Lower chest: Large LEFT lobe mass and lobar consolidation again
noted. Multiple round pulmonary nodular metastasis in LEFT and RIGHT
lung base unchanged in 1 day interval.

Hepatobiliary: No focal hepatic lesion. No biliary duct dilatation.
Common bile duct is normal.

Pancreas: Pancreas is normal. No ductal dilatation. No pancreatic
inflammation.

Spleen: Normal spleen

Adrenals/urinary tract: Adrenal glands and kidneys are normal. The
ureters and bladder normal.

Stomach/Bowel: Stomach, small bowel, appendix, and cecum are normal.
The colon and rectosigmoid colon are normal.

Vascular/Lymphatic: Bulky retroperitoneal adenopathy extending along
the LEFT aspect aorta and LEFT psoas muscle measuring 6.0 x 5.0 cm
in axial dimension (image 34/2) and 6.8 cm in craniocaudad
dimension. Nodular extension into the peritoneal space measures
cm. Large fat containing lesion in the LEFT suprarenal
retroperitoneum measuring 5.1 cm ([DATE]).

Retrocrural adenopathy measuring 2 cm.

Large long tubular filling defect within the aorta consistent with
thrombus extending from the confluence of the iliac veins to the
level of the renal veins (image 51/5)

Reproductive: Rounded nodule in the LEFT adnexal region measures
cm. Post hysterectomy.

Other: No free fluid.

Musculoskeletal: No aggressive osseous lesion.
IMPRESSION: 1. Long tubular filling defect within the infrarenal IVC consistent
with venous thrombosis. Patient is at risk for pulmonary
thromboembolism.
2. Bulky adenopathy in the LEFT retroperitoneum along the aorta and
extending into the peritoneal space.
3. Peritoneal nodularity in the LEFT adnexal region and retrocrural
space additionally.
4. Lobar consolidation mass in the RIGHT lower lobe. Multiple
metastatic pulmonary nodules.

ADDENDUM:
Correction in VASCULAR / LYMPHATIC findings section:

Large long tubular filling defect within the IVC  consistent with
thrombus extending from the confluence of the iliac veins to the
level of the renal veins (image 51/5)

*** End of Addendum ***
RADIATION DOSE REDUCTION: This exam was performed according to the
departmental dose-optimization program which includes automated
exposure control, adjustment of the mA and/or kV according to
patient size and/or use of iterative reconstruction technique.

CONTRAST:  100mL OMNIPAQUE IOHEXOL 300 MG/ML  SOLN
FINDINGS: Lower chest: Large LEFT lobe mass and lobar consolidation again
noted. Multiple round pulmonary nodular metastasis in LEFT and RIGHT
lung base unchanged in 1 day interval.

Hepatobiliary: No focal hepatic lesion. No biliary duct dilatation.
Common bile duct is normal.

Pancreas: Pancreas is normal. No ductal dilatation. No pancreatic
inflammation.

Spleen: Normal spleen

Adrenals/urinary tract: Adrenal glands and kidneys are normal. The
ureters and bladder normal.

Stomach/Bowel: Stomach, small bowel, appendix, and cecum are normal.
The colon and rectosigmoid colon are normal.

Vascular/Lymphatic: Bulky retroperitoneal adenopathy extending along
the LEFT aspect aorta and LEFT psoas muscle measuring 6.0 x 5.0 cm
in axial dimension (image 34/2) and 6.8 cm in craniocaudad
dimension. Nodular extension into the peritoneal space measures
cm. Large fat containing lesion in the LEFT suprarenal
retroperitoneum measuring 5.1 cm ([DATE]).

Retrocrural adenopathy measuring 2 cm.

Large long tubular filling defect within the aorta consistent with
thrombus extending from the confluence of the iliac veins to the
level of the renal veins (image 51/5)

Reproductive: Rounded nodule in the LEFT adnexal region measures
cm. Post hysterectomy.

Other: No free fluid.

Musculoskeletal: No aggressive osseous lesion.
IMPRESSION: 1. Long tubular filling defect within the infrarenal IVC consistent
with venous thrombosis. Patient is at risk for pulmonary
thromboembolism.
2. Bulky adenopathy in the LEFT retroperitoneum along the aorta and
extending into the peritoneal space.
3. Peritoneal nodularity in the LEFT adnexal region and retrocrural
space additionally.
4. Lobar consolidation mass in the RIGHT lower lobe. Multiple
metastatic pulmonary nodules.

## 2021-05-22 MED ORDER — IOHEXOL 300 MG/ML  SOLN
100.0000 mL | Freq: Once | INTRAMUSCULAR | Status: AC | PRN
  Administered 2021-05-22: 100 mL via INTRAVENOUS

## 2021-05-22 MED ORDER — IOHEXOL 9 MG/ML PO SOLN
500.0000 mL | ORAL | Status: AC
  Administered 2021-05-22: 500 mL via ORAL

## 2021-05-22 MED ORDER — SODIUM CHLORIDE 0.9% IV SOLUTION: Freq: Once | INTRAVENOUS | Status: AC

## 2021-05-22 MED ORDER — ONDANSETRON HCL 4 MG/2ML IJ SOLN
4.0000 mg | Freq: Four times a day (QID) | INTRAMUSCULAR | Status: DC | PRN
  Administered 2021-05-22 – 2021-05-31 (×4): 4 mg via INTRAVENOUS
  Filled 2021-05-22 (×5): qty 2

## 2021-05-22 MED ORDER — IOHEXOL 9 MG/ML PO SOLN
ORAL | Status: AC
  Administered 2021-05-22: 500 mL via ORAL
  Filled 2021-05-22: qty 1000

## 2021-05-22 MED ORDER — SODIUM CHLORIDE (PF) 0.9 % IJ SOLN
INTRAMUSCULAR | Status: AC
  Filled 2021-05-22: qty 50

## 2021-05-22 NOTE — Telephone Encounter (Signed)
[facility] and requested end of treatment note and consult note for treatment ending 04/25/2021.   ?

## 2021-05-22 NOTE — Consult Note (Signed)
? ?                                                                                ?Consultation Note ?Date: 05/22/2021  ? ?Patient Name: Kristen Freeman  ?DOB: [birth year]  MRN: [id number]  Age / Sex: 71 y.o., female  ?PCP: Allie Dimmer, MD ?Referring Physician: Antonieta Pert, MD ? ?Reason for Consultation:  goals of care ? ?HPI/Patient Profile: 71 y.o. female  with past medical history of endometrial cancer s/p chemotherapy and radiation- had just completed chemotherapy 3 weeks ago admitted on 05/19/2021 with hemoptysis and acute blood loss anemia. CT scan shows widespread metastatic disease in the lungs. Radiation consulted and plans are being made for palliative radiation therapy, medical oncology consult is pending. Palliative medicine consulted for goals of care.   ? ?Primary Decision Maker ?PATIENT ? ?Discussion: ?I have reviewed medical records including EPIC notes, labs and imaging, assessed the patient and then met with patient, her sons, and husband  to discuss diagnosis prognosis, GOC, EOL wishes, disposition and options. ? ?I introduced Palliative Medicine as specialized medical care for people living with serious illness. It focuses on providing relief from the symptoms and stress of a serious illness. The goal is to improve quality of life for both the patient and the family. ? ?We discussed a brief life review of the patient. She lives in [place], [place]. She has children and grandchildren who she enjoys spending time with.  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 ? ?As far as functional and nutritional status -prior to this admission she was independent, living at home.  She likes to be active, works in her garden tending to her flowers, she enjoys going on walks with her sister. ? ?We discussed patient's current illness and what it means in the larger context of patient's on-going co-morbidities.  Natural disease trajectory and expectations at EOL were discussed. ? ?I attempted to elicit values  and goals of care important to the patient.  Her goals are to begin treatment for her cancer she would like to transfer her care to [facility].  She knows that her treatment may affect her functional status-as long as she is able to be awake and alert and interact with her family that is her primary goal of care.  She would not want to be unconscious and just existing.  She also expresses that she does not want to be a burden on her children. ? ?Spiritual care made at visit today and began creation of advanced directives.  [name] is discussing this with her family.  If she were unable to make her decisions she would wish for her husband to be her decision maker along with the support of her children. ? ?I did not discuss CODE STATUS at this visit due to it was discussed earlier  by Worthy Flank, radiation oncology PA.  We will revisit this discussion tomorrow. ? ?Discussed with patient/family the importance of continued conversation with family and the medical providers regarding overall plan of care and treatment options, ensuring decisions are within the context of the patient?s values and GOCs.  Patient is very insightful into her illness and possible trajectories. ? ?Questions and  concerns were addressed. The family was encouraged to call with questions or concerns.  ? ?  ? ?SUMMARY OF RECOMMENDATIONS ?-Endometrial cancer with new findings of metastasis to lungs-continue full scope treatment with radiation, medical oncology consult pending ?-GOC-continued life-prolonging interventions patient will continue to assess her quality of life and discuss her goals of care with family as her treatments progress ? ?Code Status/Advance Care Planning: ?Full code ? ? ?Prognosis:   ?Unable to determine ? ?Discharge Planning: To Be Determined ? ?Primary Diagnoses: ?Present on Admission: ? Endometrial cancer ([place]) ? HTN (hypertension) ? HLD (hyperlipidemia) ? Glaucoma ? Hemoptysis ? Iron deficiency anemia ?  Thrombocytopenia ([place]) ? ? ?Review of Systems ? ?Physical Exam ? ?Vital Signs: BP (!) 153/81   Pulse (!) 106   Temp 99.6 ?F (37.6 ?C) (Oral)   Resp 19   Ht 4' 11"  (1.499 m)   Wt 55.9 kg   SpO2 96%   BMI 24.89 kg/m?  ?Pain Scale: 0-10 ?  ?Pain Score: 0-No pain ? ? ?SpO2: SpO2: 96 % ?O2 Device:SpO2: 96 % ?O2 Flow Rate: .O2 Flow Rate (L/min): 2 L/min ? ?IO: Intake/output summary:  ?Intake/Output Summary (Last 24 hours) at 05/22/2021 1356 ?Last data filed at 05/22/2021 1047 ?Gross per 24 hour  ?Intake 1370.68 ml  ?Output --  ?Net 1370.68 ml  ? ? ?LBM: Last BM Date : 05/21/21 ?Baseline Weight: Weight: 55.9 kg ?Most recent weight: Weight: 55.9 kg     ? ? ?Thank you for this consult. Palliative medicine will continue to follow and assist as needed.  ? ? ?Signed by: ?Mariana Kaufman, AGNP-C ?Palliative Medicine ? ?  ?Please contact Palliative Medicine Team phone at [phone] for questions and concerns.  ?For individual provider: See Amion ? ? ? ? ? ? ? ? ? ? ? ? ? ? ?

## 2021-05-22 NOTE — Progress Notes (Signed)
IR was consulted for urgent bronchial artery embolization on 05/21/21.  ? ?Patient's case was reviewed by Dr. Dwaine Gale, and patient was seen by IR APP.  ?Bronch last night showed right intermedius lesion - clot with tumor, probably no role for Ir embolization.  ? ?Will cancel the IR rad eval order.  ?Critical care team notified.  ? ?Please call IR for questions and concerns. ? ?Tera Mater PA-C ?05/22/2021 9:09 AM ? ? ?  ? ?

## 2021-05-22 NOTE — Progress Notes (Signed)
?  Radiation Oncology         [phone] ?________________________________ ? ?Name: Kristen Freeman MRN: [id number]  ?Date: 05/22/2021  DOB: 1950/12/17 ? ?INPATIENT ? ?SIMULATION AND TREATMENT PLANNING NOTE ? ?  ICD-10-CM   ?1. Endometrial cancer ([place])  C54.1   ?  ?2. Malignant neoplasm metastatic to right lung ([facility])  C78.01   ?  ? ? ?DIAGNOSIS:   71 yo woman with hemoptysis from right lower lung metastasis from endometrial cancer ? ?NARRATIVE:  The patient was brought to the [facility].  Identity was confirmed.  All relevant records and images related to the planned course of therapy were reviewed.  The patient freely provided informed written consent to proceed with treatment after reviewing the details related to the planned course of therapy. The consent form was witnessed and verified by the simulation staff.  Then, the patient was set-up in a stable reproducible  supine position for radiation therapy.  CT images were obtained.  Surface markings were placed.  The CT images were loaded into the planning software.  Then the target and avoidance structures were contoured.  Treatment planning then occurred.  The radiation prescription was entered and confirmed.  Then, I designed and supervised the construction of a total of at least 3 medically necessary complex treatment devices including body positioner and MLCs to shield the spinal cord and heart, with the number of devices finalized in the treatment plan.  I have requested : 3D Simulation  I have requested a DVH of the following structures: left lung, right lung, esophagus, heart, and spinal cord in addition to the target.   ? ?PLAN:  The patient will receive 30 Gy in 10 fractions to start today. ? ?________________________________ ? ?Sheral Apley Tammi Klippel, M.D. ? ?

## 2021-05-22 NOTE — Consult Note (Addendum)
? ?NAME:  Kristen Freeman, MRN:  [id number], DOB:  December 22, 1950, LOS: 3 ?ADMISSION DATE:  05/19/2021, CONSULTATION DATE:  05/20/21 ?REFERRING MD:  Dr Antonieta Pert fo [facility], CHIEF COMPLAINT:  Hemoptysis  ? ? ?History of Present Illness: -Provided by the patient and also review of the admission records with the hospitalist  ?71 year old female who lives in [place].  Diagnosed with endometrial cancer and other specified 2019 and Lovenox [place].  Was under the care of Juluis Rainier.  She says she got some chemotherapy and was under remission.  Then in December 2022 she had recurrence in the lymphadenopathy in the abdomen.  Status post radiation and chemo.  Was doing well till 05/17/2021 when she had small amount of hemoptysis early in the morning on Wednesday.  Then repeat hemoptysis on 05/18/2021 Thursday.  Then again repeat small amount on 05/19/2021 Friday.  This time was a little more and there was a blood clot.  She then went to the emergency department at [facility].  She had a CT chest [images are not available they did not send the CD-ROM].  But this showed a mass and therefore she has been referred here.  She is here for the hemoptysis and also she wants to transfer her oncology care to [facility].  She tells me that Dr. Beatrix Fetters is relocating and is not going to be available anymore.  Apparently in the ER they gave her platelets for a platelet count of 59,000.  Since then has been no further hemoptysis.  She has not been collecting her hemoptysis any sputum cup. ? ?According to the hospitalist the CT chest showed pulmonary met metastasis.  There is one mass 7.6 cm with bulky adenopathy.  Chest x-ray clear shows bilateral lower lobe density right greater than left Largest is 6.8 cm.  Looks smooth ? ?Past Medical History:  ? ? has a past medical history of Endometrial cancer ([place]), Glaucoma, HLD (hyperlipidemia), and HTN (hypertension). ? ? reports that she has never smoked. She has never used smokeless  tobacco. ? ?Past Surgical History:  ?Procedure Laterality Date  ? TM repair N/A   ? TONSILLECTOMY    ? TOTAL ABDOMINAL HYSTERECTOMY W/ BILATERAL SALPINGOOPHORECTOMY    ? TOTAL KNEE ARTHROPLASTY Left   ? ? ?Allergies  ?Allergen Reactions  ? Latex Rash and Other (See Comments)  ?  Band-Aids are not tolerated  ? Tape Rash  ? ? ?Immunization History  ?Administered Date(s) Administered  ? Moderna Sars-Covid-2 Vaccination 12/27/2019  ? ? ?Family History  ?Problem Relation Age of Onset  ? Alzheimer's disease Mother   ? Diabetes Father   ? Prostate cancer Father   ? Hypertension Sister   ? Diabetes Sister   ? Breast cancer Sister   ? Hypertension Sister   ? Diabetes Sister   ? CVA Brother   ? Alcoholism Brother   ? ? ? ?Current Facility-Administered Medications:  ?  0.9 %  sodium chloride infusion (Manually program via Guardrails IV Fluids), , Intravenous, Once, Anders Simmonds, MD ?  0.9 %  sodium chloride infusion, 250 mL, Intravenous, PRN, Norins, Heinz Knuckles, MD ?  acetaminophen (TYLENOL) tablet 650 mg, 650 mg, Oral, Q6H PRN **OR** acetaminophen (TYLENOL) suppository 650 mg, 650 mg, Rectal, Q6H PRN, Norins, Heinz Knuckles, MD ?  Chlorhexidine Gluconate Cloth 2 % PADS 6 each, 6 each, Topical, Daily, Norins, Heinz Knuckles, MD, 6 each at 05/21/21 1142 ?  dextrose 5 % in lactated ringers infusion, , Intravenous, Continuous, Brand Males, MD,  Last Rate: 50 mL/hr at 05/22/21 0700, Infusion Verify at 05/22/21 0700 ?  famotidine (PEPCID) tablet 20 mg, 20 mg, Oral, Daily, Kc, Ramesh, MD ?  fentaNYL (SUBLIMAZE) injection 25-50 mcg, 25-50 mcg, Intravenous, Q5 min PRN, Brand Males, MD, 50 mcg at 05/21/21 1801 ?  HYDROcodone bit-homatropine (HYCODAN) 5-1.5 MG/5ML syrup 5 mL, 5 mL, Oral, TID, Brand Males, MD, 5 mL at 05/21/21 2142 ?  latanoprost (XALATAN) 0.005 % ophthalmic solution 1 drop, 1 drop, Both Eyes, QHS, Kc, Ramesh, MD, 1 drop at 05/21/21 2150 ?  midazolam (VERSED) injection 1-2 mg, 1-2 mg, Intravenous, Q5 min PRN,  Brand Males, MD, 2 mg at 05/21/21 1801 ?  senna (SENOKOT) tablet 8.6 mg, 1 tablet, Oral, BID, Norins, Heinz Knuckles, MD, 8.6 mg at 05/21/21 2142 ?  sodium chloride flush (NS) 0.9 % injection 10-40 mL, 10-40 mL, Intracatheter, Q12H, Norins, Heinz Knuckles, MD, 10 mL at 05/21/21 2142 ?  sodium chloride flush (NS) 0.9 % injection 10-40 mL, 10-40 mL, Intracatheter, PRN, Norins, Heinz Knuckles, MD ?  sodium chloride flush (NS) 0.9 % injection 3 mL, 3 mL, Intravenous, Q12H, Norins, Heinz Knuckles, MD, 3 mL at 05/21/21 2142 ?  sodium chloride flush (NS) 0.9 % injection 3 mL, 3 mL, Intravenous, PRN, Norins, Heinz Knuckles, MD ?  tranexamic acid (CYKLOKAPRON) 1000 MG/10ML nebulizer solution 500 mg, 500 mg, Nebulization, Q8H, Ramaswamy, Murali, MD, 500 mg at 05/22/21 0447 ?  traZODone (DESYREL) tablet 25 mg, 25 mg, Oral, QHS PRN, Norins, Heinz Knuckles, MD, 25 mg at 05/20/21 2111 ? ? ? ? ?Significant Hospital Events:  ?05/19/2021 - admit ?3/26 Bronchoscopy per Dr. Ramaswamy>>  Rt bronchus intermedius lesion - clot with tumor. Blocking entire distal airay into RML and RLL ?IR deferred of embolization  ?3/27 Urgent Referral to radiation oncology to control bleeding ( HGB drop from 7.6 to 6.9 overnight ? ?Interim History / Subjective:  ? ?05/22/2021  ?Pt is in no distress, states she has not coughed up any blood since 3/26 am. ?She is on RA with adequate sats of 98%, in no distress ?Anxious to start treatment ? ?- HGB drop to 6/9 ( 7.6) , platelets 80 K ( 66)  ?Receiving 1 unit PRBC now ? ? ?Labs reviewed>>  ?HGB 6.9, WBC 4.2, Platelets 80K ?Na 140, K 3.6, Glucose 131, Creatinine 0.53, Calcium 8.7 ?T Max 100.1 ? ?Urgent referral placed to Radiation oncology ?Objective   ?Blood pressure 128/69, pulse (!) 103, temperature 99.7 ?F (37.6 ?C), temperature source Oral, resp. rate 19, height _0  (1.499 m), weight 55.9 kg, SpO2 96 %. ?   ?   ? ?Intake/Output Summary (Last 24 hours) at 05/22/2021 0937 ?Last data filed at 05/22/2021 0700 ?Gross per 24 hour   ?Intake 1055.68 ml  ?Output --  ?Net 1055.68 ml  ? ?Filed Weights  ? 05/22/21 0800  ?Weight: 55.9 kg  ? ? ?Examination: ?General: Thin pleasant lady sitting in her bed and talking to family members that include her husband, and son ?HENT: NCAT, Some alopecia present no neck nodes.  Hearing aid present ?Lungs: Bilateral chest excursion Clear to auscultation bilaterally ?Cardiovascular: Normal heart sounds ?Abdomen: Soft nontender no organomegaly ?Extremities: No cyanosis.  No edema.  ?  clubbing present ?Neuro: Alert and oriented x3.  MAE x 4, Speech normal. ?GU: Not examined. ? ?Resolved Hospital Problem list   ? ? ?Assessment & Plan:  ?ASSESSMENT / PLAN: ? 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 Blocking entire distal airay into RML and RLL ?IR embolization is not an option  ? ?05/22/2021 -> no hemoptysis since admission to ICU on 3/26 ? ?P:   ?TXA Nebs  ?Measure hemoptysis and sputum cup [gave verbal instructions to nursing} ?Urgent referral placed to Radiation oncology 3/27 am  ?Plan for targeted radiation to control bleeding , then debulking as an OP if she remains stable, sooner if she RLL collapses ?Avoid anticoagulaton ?CXR 3/28 am  ?CXR now  ?Trend CBC daily ?Transfuse for HGB < 7 ?CXR for sudden drop in sats to assess RLL for collapse ?Consider palliation consult>>Allison NP with  Radiation oncology has spoken with patient and family. She will place consult ? ?Thrombocytopenia ?Plan ?Trend platelets ?Transfuse as needed for oozing or bleeding ? ?Both radiation oncology and medical oncology have seen patient today. She will go for simulation today, and start radiation 3/28.  ?Greatly appreciate assistance with this patient.  ?Medical oncology plan to get CT Abdomen and pelvis and MRI brain for re-staging. Dr. Alvy Bimler  will see 3/28.  ? ?Best practice (daily eval):  ? ? ?Goals of Care:  ?Remains Full Code  ?Family updated in full 3/27 ?Agree with plans for XRT per RAD ONC  to control bleeding ?Understand will need debulkin

## 2021-05-22 NOTE — Addendum Note (Signed)
Encounter addended by: Tyler Pita, MD on: 05/22/2021 12:23 PM ? Actions taken: Level of Service modified, Medication List reviewed, Problem List reviewed, Allergies reviewed

## 2021-05-22 NOTE — Consult Note (Addendum)
?Radiation Oncology         [phone] ?________________________________ ? ?Initial inpatient Consultation ? ?Name: Kristen Freeman MRN: [id number]  ?Date of Service: 05/22/21 DOB: 10/22/1950 ? ?[id number], Vaughan Basta, MD  No ref. provider found  ? ?REFERRING PHYSICIAN: No ref. provider found ? ?DIAGNOSIS: 71 yo woman with hemoptysis from right lower lung metastasis ? ?No diagnosis found. ? ?HISTORY OF PRESENT ILLNESS: Latera Mclin is a 71 y.o. female seen at the request of Dr. 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  She was originally diagnosed with stage Ib high-grade endometrial cancer status post robotic hysterectomy bilateral salpingo-oophorectomy and lymph node dissection with omental biopsy on 02/13/2018.  She was treated with adjuvant chemotherapy and vaginal brachytherapy all of her treatment was completed in June 2020 in the adjuvant setting.  She was followed in surveillance until imaging by CT in July 2022 showed no definitive recurrent disease but 9 mm left retroperitoneal periaortic lymph node that was suspicious.  It was recommended that this be followed closely in October 2022 by CT imaging the same periaortic lymph node was 2.3 cm.  Of percutaneous biopsy in November 2022 was inconclusive and repeat of this was performed on 01/27/2021 showing a metastatic m?llerian high-grade non-small cell carcinoma consistent with her history of endometrial cancer.  She did have a PET scan performed in December 2022 that showed hypermetabolism in the left common iliac and left periaortic lymph nodes.  She was treated in [place] with chemoradiation with cisplatin sensitization for 5 cycles, and completed her treatment approximately 3 weeks ago.  Of note Dr. Venia Minks is her radiation oncologist in [place]. ? ?She was reestablishing her care when she saw her medical oncologist Dr. Beatrix Fetters last week.  Plans were to restage her chest.   She was supposed to be going for her CT scan today but developed hemoptysis acutely last week.  She was counseled to proceed to the hospital and out side imaging showed disease in her lungs.  She was transferred to [facility] and CT angiography yesterday showed widespread metastatic disease in the lungs with numerous large pulmonary nodules and masses many of which demonstrate groundglass attenuation with extensive mediastinal and left supraclavicular adenopathy.  The largest of the mediastinal nodal conglomerate measures up to 5.1 cm anterior to the origin of the great vessels.  In the lung especially along the right lower lobe the largest of the nodules measures up to 6.7 cm and appears to be encasing the bronchus at that level.  She was admitted to ICU stepdown and was found to have symptoms of witnessed hemoptysis and acute blood loss anemia.  She apparently received unit of platelets for a platelet count of 59,000 at [facility], she has received 1 unit packed RBCs this morning as her hemoglobin had been 7.6 yesterday and 6.9 early this morning.   Her O2 demands have been stable on room air today but overnight had 2 L of supplemental oxygen following bronchoscopy with Dr. Chase Caller at the bedside.  He identified the entire right bronchus intermedius being blocked off with tumor and clot overlay hemostasis was achieved at the conclusion and some of her blood clots were able to be evacuated. She is seen to consider palliative radiotherapy in an urgent manner. ? ?PREVIOUS RADIATION THERAPY: Yes  ? ?03/2021- ?[place] with Dr. Venia Minks. She received palliative chemoRT for about 6 weeks with cisplatin sensitization to the retroperitoneal and iliac nodes, dates and dose unknown ? ?07/2018- ?[place] with  Dr. Venia Minks, she received vaginal cuff brachytherapy for Stage IB endometrial carcinoma; dates and dose unknown ? ?PAST MEDICAL HISTORY:  ?Past Medical History:  ?Diagnosis Date  ? Endometrial cancer  ([place])   ? dx 2019 tx'd; recurrence 12/22 - XRT x 30, Chem 03/21/21 to 04/25/21  ? Glaucoma   ? HLD (hyperlipidemia)   ? on statin  ? HTN (hypertension)   ?   ? ?PAST SURGICAL HISTORY: ?Past Surgical History:  ?Procedure Laterality Date  ? TM repair N/A   ? TONSILLECTOMY    ? TOTAL ABDOMINAL HYSTERECTOMY W/ BILATERAL SALPINGOOPHORECTOMY    ? TOTAL KNEE ARTHROPLASTY Left   ? ? ?FAMILY HISTORY:  ?Family History  ?Problem Relation Age of Onset  ? Alzheimer's disease Mother   ? Diabetes Father   ? Prostate cancer Father   ? Hypertension Sister   ? Diabetes Sister   ? Breast cancer Sister   ? Hypertension Sister   ? Diabetes Sister   ? CVA Brother   ? Alcoholism Brother   ? ? ?SOCIAL HISTORY:  ?Social History  ? ?Socioeconomic History  ? Marital status: Married  ?  Spouse name: Not on file  ? Number of children: 3  ? Years of education: 88  ? Highest education level: Not on file  ?Occupational History  ? 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 Smoking status: Never  ? Smokeless tobacco: Never  ?Substance and Sexual Activity  ? Alcohol use: Not Currently  ? Drug use: Never  ? Sexual activity: Not on file  ?Other Topics Concern  ? Not on file  ?Social History Narrative  ? Not on file  ? ?Social Determinants of Health  ? ?Financial Resource Strain: Not on file  ?Food Insecurity: Not on file  ?Transportation Needs: Not on file  ?Physical Activity: Not on file  ?Stress: Not on file  ?Social Connections: Not on file  ?Intimate Partner Violence: Not on file  ? The patient is married. She and her husband are looking forward to their 50th wedding anniversary this October 2023. 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 They have three adult children, two sons and a daughter, one son lives out of town in [place] and he just flew in today. She identifies as being a woman of faith. She lives in [place], [place] and has the support of  her family closer to home and also her sister who lives in [place].  ? ?ALLERGIES: Latex and Tape ? ?MEDICATIONS:  ?Current Facility-Administered Medications  ?Medication Dose Route Frequency Provider Last Rate Last Admin  ? 0.9 %  sodium chloride infusion  250 mL Intravenous PRN Norins, Heinz Knuckles, MD      ? acetaminophen (TYLENOL) tablet 650 mg  650 mg Oral Q6H PRN Norins, Heinz Knuckles, MD      ? Or  ? acetaminophen (TYLENOL) suppository 650 mg  650 mg Rectal Q6H PRN Norins, Heinz Knuckles, MD      ? Chlorhexidine Gluconate Cloth 2 % PADS 6 each  6 each Topical Daily Norins, Heinz Knuckles, MD   6 each at 05/21/21 1142  ? dextrose 5 % in lactated ringers infusion   Intravenous Continuous Brand Males, MD 50 mL/hr at 05/22/21 0700 Infusion Verify at 05/22/21 0700  ? famotidine (PEPCID) tablet 20 mg  20 mg Oral Daily Kc, Ramesh, MD   20 mg at 05/22/21 1000  ? fentaNYL (  SUBLIMAZE) injection 25-50 mcg  25-50 mcg Intravenous Q5 min PRN Brand Males, MD   50 mcg at 05/21/21 1801  ? HYDROcodone bit-homatropine (HYCODAN) 5-1.5 MG/5ML syrup 5 mL  5 mL Oral TID Brand Males, MD   5 mL at 05/22/21 1002  ? latanoprost (XALATAN) 0.005 % ophthalmic solution 1 drop  1 drop Both Eyes QHS Kc, Maren Beach, MD   1 drop at 05/21/21 2150  ? midazolam (VERSED) injection 1-2 mg  1-2 mg Intravenous Q5 min PRN Brand Males, MD   2 mg at 05/21/21 1801  ? senna (SENOKOT) tablet 8.6 mg  1 tablet Oral BID Norins, Heinz Knuckles, MD   8.6 mg at 05/22/21 1000  ? sodium chloride flush (NS) 0.9 % injection 10-40 mL  10-40 mL Intracatheter Q12H Norins, Heinz Knuckles, MD   10 mL at 05/22/21 1001  ? sodium chloride flush (NS) 0.9 % injection 10-40 mL  10-40 mL Intracatheter PRN Norins, Heinz Knuckles, MD      ? sodium chloride flush (NS) 0.9 % injection 3 mL  3 mL Intravenous Q12H Norins, Heinz Knuckles, MD   3 mL at 05/22/21 1001  ? sodium chloride flush (NS) 0.9 % injection 3 mL  3 mL Intravenous PRN Norins, Heinz Knuckles, MD      ? tranexamic acid (CYKLOKAPRON)  1000 MG/10ML nebulizer solution 500 mg  500 mg Nebulization Q8H Brand Males, MD   500 mg at 05/22/21 0447  ? traZODone (DESYREL) tablet 25 mg  25 mg Oral QHS PRN Norins, Heinz Knuckles, MD   25 mg at 03/25

## 2021-05-22 NOTE — Progress Notes (Signed)
Chaplain received a consult that patient wished to create an advance directive form.  Kristen Freeman was in good spirits and feels well supported by family.  She plans to have a conversation with them about her wishes and chaplain provided her with the paperwork so that she can begin to fill it out.  Chaplain also provided spiritual support as Kristen Freeman shared about how her faith keeps her uplifted. Chaplain also provided support to Kristen Freeman's husband, Elenore Rota, as well as her 3 children. ? ?[employer], Bcc ?Pager, [phone] ?

## 2021-05-22 NOTE — Consult Note (Addendum)
[place]  ?[phone] [phone]  ? ?I have seen the patient, examined her and reviewed outside records independently ?MEDICAL ONCOLOGY - INITIAL CONSULTATION ? ?Referral MD: Dr. Antonieta Pert ? ?Reason for Referral: Metastatic high-grade dedifferentiated endometrial cancer.  ?Her husband Elenore Rota, and 2 sons, Gerald Stabs and [name] are available by the bedside and daughter is available over the phone ? ?HPI: [name] is a 71 year old female with a past medical history significant for hypertension, hyperlipidemia, glaucoma, endometrial cancer. She presented to the emergency department in [place] due to hemoptysis.  She was transferred to [facility].  CTA chest performed on admission to our facility showed widespread metastatic disease to the chest including numerous large pulmonary nodules and masses.  Many of these demonstrate surrounding groundglass attenuation suggestive of perilesional alveolar hemorrhage.  There was also extensive mediastinal and left supraclavicular lymphadenopathy.  The patient has been seen by pulmonology and underwent bronchoscopy on 05/21/2021 which showed clot with tumor in the right bronchus.  This tumor was blocking the entire distal airway into the right middle lobe and right lower lobe. ? ?Oncologic history is as follows: ?-She was initially diagnosed with a stage Ib, high-grade (dedifferentiated) endometrial cancer status post robotic assisted total laparoscopic hysterectomy, bilateral salpingo-oophorectomy, lymphatic mapping with bilateral pelvic and periaortic sentinel lymph node excision, omental biopsy, mini laparotomy on 02/13/2018.   ?-She received 6 cycles of paclitaxel and carboplatin in [place] and had restaging CT scans performed after cycle 6 which showed NED.  She then completed 21 Gy vaginal brachytherapy in 3 fractions at 7 Gy per fraction on 08/04/2018.   ?-CT chest/abdomen/pelvis on 02/23/2019 showed some resolved and/or  stable lung nodules and the possibility of a new 4 mm right lung nodule. There was also persistent thickening of the wall of the pylorus and diffuse diverticula throughout the entire colon.   ?-She had a GI evaluation and is now s/p EGD/colonoscopy on 04/08/19, which demonstrated "mild reflux, mild antritis, tortuous colon, scattered diverticulosis," and a benign inflammatory pseudopolyp in the ascending colon.  ?-CT chest on 09/07/19 showed stable 3 and 4 mm lung nodules.  ?-She was last seen on 06/14/2020 a 1-monthsurveillance visit and for central venous port removal. She was asymptomatic at that time.  ?-CT chest/abd/pelvis on 09/08/2020 for surveillance described no definitive evidence of recurrent disease, but there was a 9 mm left retroperitoneal periaortic lymph node that appeared larger than previous and was suspicious.  ?-A repeat CT Abd/Pelvis on 12/20/2020 described an enlarging left paraaortic lymph node measuring 2.0 x 2.3 cm; previously 1.3 x 0.9 cm. -Percutaneous biopsy was attempted on 01/09/2021 and was inconclusive. 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  ?-PET/CT on 02/06/2021 described metastatic hypermetabolic left common iliac and left para-aortic adenopathy.  ?-[place] 5940 cGy to the common iliac/periaortic nodal recurrence.  Radiation was given concurrently with cisplatin.  She was unable to receive some of the weekly cisplatin due to cytopenias.  Chemotherapy was initiated on 03/21/2021.  She had dose reduction starting with cycle 4.  Required Neulasta. She received a total of 5 cycles of weekly cisplatin with radiation treatment.  Treatment was complicated with fatigue and nausea.  She  completed all treatments on approximately 04/25/2021.  ? ?The patient reports that  she developed a small amount of hemoptysis last Wednesday but none on Thursday and then had some recurrent hemoptysis again on Friday.  She had significant hemoptysis this past Sunday but none since.  She currently denies any other bleeding such as hematuria, melena, hematochezia.  She reports her appetite has been good.  She has not lost any significant weight.  She denies headaches, dizziness, chest pain, shortness of breath, abdominal pain, nausea, vomiting.  Medical oncology was asked see the patient for recommendations regarding her endometrial cancer. ? ?Past Medical History:  ?Diagnosis Date  ? Endometrial cancer ([place])   ? dx 2019 tx'd; recurrence 12/22 - XRT x 30, Chem 03/21/21 to 04/25/21  ? Glaucoma   ? HLD (hyperlipidemia)   ? on statin  ? HTN (hypertension)   ?: ? ? ?Past Surgical History:  ?Procedure Laterality Date  ? TM repair N/A   ? TONSILLECTOMY    ? TOTAL ABDOMINAL HYSTERECTOMY W/ BILATERAL SALPINGOOPHORECTOMY    ? TOTAL KNEE ARTHROPLASTY Left   ?: ? ? ?Current Facility-Administered Medications  ?Medication Dose Route Frequency Provider Last Rate Last Admin  ? 0.9 %  sodium chloride infusion  250 mL Intravenous PRN Norins, Heinz Knuckles, MD      ? acetaminophen (TYLENOL) tablet 650 mg  650 mg Oral Q6H PRN Norins, Heinz Knuckles, MD      ? Or  ? acetaminophen (TYLENOL) suppository 650 mg  650 mg Rectal Q6H PRN Norins, Heinz Knuckles, MD      ? Chlorhexidine Gluconate Cloth 2 % PADS 6 each  6 each Topical Daily Norins, Heinz Knuckles, MD   6 each at 05/21/21 1142  ? dextrose 5 % in lactated ringers infusion   Intravenous Continuous Brand Males, MD 50 mL/hr at 05/22/21 1358 New Bag at 05/22/21 1358  ? famotidine (PEPCID) tablet 20 mg  20 mg Oral Daily Kc, Ramesh, MD   20 mg at 05/22/21 1000  ? fentaNYL (SUBLIMAZE) injection 25-50 mcg  25-50 mcg Intravenous Q5 min PRN Brand Males, MD   50 mcg at 05/21/21 1801  ? HYDROcodone  bit-homatropine (HYCODAN) 5-1.5 MG/5ML syrup 5 mL  5 mL Oral TID Brand Males, MD   5 mL at 05/22/21 1002  ? latanoprost (XALATAN) 0.005 % ophthalmic solution 1 drop  1 drop Both Eyes QHS Kc, Maren [place], MD   1 drop at 05/21/21 2150  ? midazolam (VERSED) injection 1-2 mg  1-2 mg Intravenous Q5 min PRN Brand Males, MD   2 mg at 05/21/21 1801  ? senna (SENOKOT) tablet 8.6 mg  1 tablet Oral BID Norins, Heinz Knuckles, MD   8.6 mg at 05/22/21 1000  ? sodium chloride flush (NS) 0.9 % injection 10-40 mL  10-40 mL Intracatheter Q12H Norins, Heinz Knuckles, MD   10 mL at 05/22/21 1001  ? sodium chloride flush (NS) 0.9 % injection 10-40 mL  10-40 mL Intracatheter PRN Norins, Heinz Knuckles, MD      ? sodium chloride flush (NS) 0.9 % injection 3 mL  3 mL Intravenous Q12H Norins, Heinz Knuckles, MD   3 mL at 05/22/21 1001  ? sodium chloride flush (NS) 0.9 % injection 3 mL  3 mL Intravenous PRN Norins, Heinz Knuckles, MD      ? tranexamic acid (CYKLOKAPRON) 1000 MG/10ML nebulizer solution 500 mg  500 mg Nebulization Q8H Brand Males, MD   500 mg at 05/22/21 0447  ? traZODone (DESYREL) tablet 25 mg  25 mg Oral QHS PRN Norins, Heinz Knuckles, MD  25 mg at 05/20/21 2111  ? ? ? ? ?Allergies  ?Allergen Reactions  ? Latex Rash and Other (See Comments)  ?  Band-Aids are not tolerated  ? Tape Rash  ?: ? ? ?Family History  ?Problem Relation Age of Onset  ? Alzheimer's disease Mother   ? Diabetes Father   ? Prostate cancer Father   ? Hypertension Sister   ? Diabetes Sister   ? Breast cancer Sister   ? Hypertension Sister   ? Diabetes Sister   ? CVA Brother   ? Alcoholism Brother   ?: ? ? ?Social History  ? ?Socioeconomic History  ? Marital status: Married  ?  Spouse name: Not on file  ? Number of children: 3  ? Years of education: 48  ? Highest education level: Not on file  ?Occupational History  ? 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 Smoking status: Never  ? Smokeless tobacco: Never  ?Substance and Sexual Activity  ? Alcohol  use: Not Currently  ? Drug use: Never  ? Sexual activity: Not on file  ?Other Topics Concern  ? Not on file  ?Social History Narrative  ? Not on file  ? ?Social Determinants of Health  ? ?Retail buyer

## 2021-05-22 NOTE — Progress Notes (Signed)
eLink Physician-Brief Progress Note ?Patient Name: Nasira Janusz ?DOB: Jan 20, 1951 ?MRN: [id number] ? ? ?Date of Service ? 05/22/2021  ?HPI/Events of Note ? Anemia - Hgb = 6.9.  ?eICU Interventions ? Will transfuse 1 unit PRBC.  ? ? ? ?Intervention Category ?Major Interventions: Other: ? ?Aneesah Hernan Cornelia Copa ?05/22/2021, 5:26 AM ?

## 2021-05-22 NOTE — Progress Notes (Addendum)
HEMATOLOGY-ONCOLOGY PROGRESS NOTE ? ?ASSESSMENT AND PLAN: ?This is a 71 year old female with metastatic high-grade endometrial cancer.  Has received prior treatment as outlined below.  Now with widespread recurrent disease.  She has developed hemoptysis from one of her lung masses. ?Recommend radiation oncology consult for palliative radiation to the lung mass that is bleeding. ?Dr. Alvy Bimler from medical oncology will formally consult tomorrow. ?We will go ahead and obtain restaging work-up including a CT of the abdomen/pelvis as well as an MRI of the brain. ?We will discuss treatment options with the patient and her family once we have additional information. ? ?Mikey Bussing, DNP, AGPCNP-BC, AOCNP ?Mon/Tues/Thurs/Fri 7am-5pm; Off Wednesdays ?Cell: [phone] ? ?SUBJECTIVE: ?Kristen Freeman is a 71 year old female with a past medical history significant for endometrial cancer diagnosed in 2019.  Outside records reviewed through care everywhere.   ? ?Oncologic history is as follows: ?She was initially diagnosed with a stage Ib, high-grade (dedifferentiated) endometrial cancer status post robotic assisted total laparoscopic hysterectomy, bilateral salpingo-oophorectomy, lymphatic mapping with bilateral pelvic and periaortic sentinel lymph node excision, omental biopsy, mini laparotomy on 02/13/2018.  She received 6 cycles of paclitaxel and carboplatin in [place] and had restaging CT scans performed after cycle 6 which showed NED.  She then completed 21 Gy vaginal brachytherapy in 3 fractions at 7 Gy per fraction on 08/04/2018.  CT chest/abdomen/pelvis on 02/23/2019 showed some resolved and/or stable lung nodules and the possibility of a new 4 mm right lung nodule. There was also persistent thickening of the wall of the pylorus and diffuse diverticula throughout the entire colon.  I recommended GI evaluation and she is now s/p EGD/colonoscopy on 04/08/19, which demonstrated "mild reflux, mild antritis, tortuous colon,  scattered diverticulosis," and a benign inflammatory pseudopolyp in the ascending colon. CT chest on 09/07/19 showed stable 3 and 4 mm lung nodules. She was last seen on 06/14/2020 a 8-monthsurveillance visit and for central venous port removal. She was asymptomatic at that time. CT chest/abd/pelvis on 09/08/2020 for surveillance described no definitive evidence of recurrent disease, but there was a 9 mm left retroperitoneal periaortic lymph node that appeared larger than previous and was suspicious. A repeat CT Abd/Pelvis on 12/20/2020 described an enlarging left paraaortic lymph node measuring 2.0 x 2.3 cm; previously 1.3 x 0.9 cm. Percutaneous biopsy was attempted on 01/09/2021 and was inconclusive. 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 PET/CT on 02/06/2021 described metastatic hypermetabolic left common iliac and left para-aortic adenopathy. [place] received a total of 5 cycles of weekly cisplatin with radiation treatment. She reports that treatment was complicated by intermittent nausea and fatigue. She completed all treatments on approximately 04/25/2021.  ? ?[place].  [facility]  [facility] chest performed on admission to our facility showed widespread metastatic disease to the chest including numerous large pulmonary nodules and masses.  Many of these demonstrate surrounding groundglass attenuation suggestive of perilesional alveolar hemorrhage.  There was also extensive mediastinal and left supraclavicular lymphadenopathy.  The patient has been seen by pulmonology and underwent bronchoscopy on 05/21/2021 which showed clot with tumor  in the right bronchus.  This tumor was blocking the entire distal airway into the right middle lobe and right lower lobe. ? ?Today, the patient  reports no recurrent hemoptysis.  Hemoglobin down to 6.9 today and she is receiving 1 unit PRBCs.  She denies any other bleeding.  She chronic denies headaches, dizziness, chest pain, shortness of breath, abdominal pain, nausea, vomiting.  Medical oncology was asked to see the patient to make recommendations regarding her endometrial cancer. ? ? ?REVIEW OF SYSTEMS:   ?Review of Systems  ?Constitutional:  Negative for chills and fever.  ?HENT: Negative.    ?Respiratory:  Positive for cough and hemoptysis.   ?     Hemoptysis has resolved at this point  ?Cardiovascular: Negative.   ?Gastrointestinal: Negative.   ?Skin: Negative.   ?Neurological: Negative.   ?Psychiatric/Behavioral: Negative.    ? ?I have reviewed the past medical history, past surgical history, social history and family history with the patient and they are unchanged from previous note. ? ? ?PHYSICAL EXAMINATION: ?ECOG PERFORMANCE STATUS: 2 - Symptomatic, <50% confined to bed ? ?Vitals:  ? 05/22/21 1000 05/22/21 1100  ?BP: 132/67 (!) 153/81  ?Pulse: (!) 102 (!) 106  ?Resp: 16 19  ?Temp:  99.6 ?F (37.6 ?C)  ?SpO2: 97% 96%  ? ?Filed Weights  ? 05/22/21 0800  ?Weight: 55.9 kg  ? ? ?Intake/Output from previous day: ?03/26 0701 - 03/27 0700 ?In: 1055.7 [I.V.:787.7; Blood:268] ?Out: -  ? ?Physical Exam ?Vitals reviewed.  ?Constitutional:   ?   General: She is not in acute distress. ?HENT:  ?   Head: Normocephalic.  ?Eyes:  ?   General: No scleral icterus. ?   Conjunctiva/sclera: Conjunctivae normal.  ?Cardiovascular:  ?   Rate and Rhythm: Normal rate and regular rhythm.  ?Pulmonary:  ?   Effort: Pulmonary effort is normal. No respiratory distress.  ?   Comments: Wheezing to the right lower lobe ?Lymphadenopathy:  ?   Upper Body:  ?   Left upper body: Supraclavicular adenopathy present.  ?Skin: ?   General: Skin is  warm and dry.  ?Neurological:  ?   Mental Status: She is alert and oriented to person, place, and time.  ?Psychiatric:     ?   Mood and Affect: Mood normal.     ?   Behavior: Behavior normal.     ?   Thought Content: Thought content normal.     ?   Judgment: Judgment normal.  ? ? ?LABORATORY DATA:  ?I have reviewed the data as listed ? ?  Latest Ref Rng & Units 05/22/2021  ?  4:41 AM 05/21/2021  ? 11:40 AM 05/21/2021  ?  4:01 AM  ?CMP  ?Glucose 70 - 99 mg/dL 131    127    ?BUN 8 - 23 mg/dL 11    14    ?Creatinine 0.44 - 1.00 mg/dL 0.53    0.64    ?Sodium 135 - 145 mmol/L 140    136    ?Potassium 3.5 - 5.1 mmol/L 3.6    3.6    ?Chloride 98 - 111 mmol/L 110    106    ?CO2 22 - 32 mmol/L 25    24    ?Calcium 8.9 - 10.3 mg/dL 8.7    8.7    ?Total Protein 6.5 - 8.1 g/dL  6.7     ?Total Bilirubin 0.3 - 1.2 mg/dL  0.8     ?Alkaline Phos 38 - 126 U/L  67     ?AST 15 - 41 U/L  27     ?ALT 0 - 44 U/L  23     ? ? ?  Lab Results  ?Component Value Date  ? WBC 4.2 05/22/2021  ? HGB 6.9 (LL) 05/22/2021  ? HCT 20.9 (L) 05/22/2021  ? MCV 109.4 (H) 05/22/2021  ? PLT 80 (L) 05/22/2021  ? ? ?No results found for: CEA1, CEA, [id number], CA125, PSA1 ? ?CT Angio Chest Pulmonary Embolism (PE) W or WO Contrast ? ?Result Date: 05/21/2021 ?CLINICAL DATA:  71 year old female with history of abnormal chest x-ray. Endometrial cancer. Hemoptysis. * Tracking Code: BO * EXAM: CT ANGIOGRAPHY CHEST WITH CONTRAST TECHNIQUE: Multidetector CT imaging of the chest was performed using the standard protocol during bolus administration of intravenous contrast. Multiplanar CT image reconstructions and MIPs were obtained to evaluate the vascular anatomy. RADIATION DOSE REDUCTION: This exam was performed according to the departmental dose-optimization program which includes automated exposure control, adjustment of the mA and/or kV according to patient size and/or use of iterative reconstruction technique. CONTRAST:  64m OMNIPAQUE IOHEXOL 350 MG/ML SOLN COMPARISON:  No  priors. FINDINGS: Comment: Today's study is limited by considerable patient respiratory motion. Cardiovascular: There are no central, lobar or segmental sized filling defects within the pulmonary arte

## 2021-05-22 NOTE — Progress Notes (Signed)
?PROGRESS NOTE ?Kristen Freeman  SWF:[id number] DOB: 1950-07-19 DOA: 05/19/2021 ?PCP: Allie Dimmer, MD  ? ?Brief Narrative/Hospital Course: ?71 year old female with history of endometrial cancer in 2019 and in remission until Dec '22 when she was found to have recurrence, underwent XRT and chemo completing treatment 04/25/21. She had an episode of hemoptysis 05/17/21. She sought care at ED in [place]. She was found to have thrombocytopenia and did had platelet transfusion. Other Lab - Glucose 115, INR 1.1, Hgb 8.4, WBC 4.4, Plt 59, nl Diff. CTA chest - increased pulmonary mets: multiple large nodules. ? Post-obstructive consolidation with nodule 7.6x3.2x2 cm. Bulky adenopathy.EKG - nl.  She was subsequently transferred to [facility] for further evaluation and management.   ?3.26- transferred to stepdown/icu for hemoptysis recurring- s/p bronchoscopy: showed "1. 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 Blocking entire distal airay into RML and RLL"  ?  ?Subjective: ?Seen examined this morning.  Family at the bedside ?Patient feels well.  No hemoptysis ?Overnight hh dropped and 1 unit prbc ordered ?Doing well on room air. ?Patient oncologist had called patient this morning from [place]  ? ?Assessment and Plan: ?Principal Problem: ?  Hemoptysis ?Active Problems: ?  Endometrial cancer ([place]) ?  HTN (hypertension) ?  HLD (hyperlipidemia) ?  Glaucoma ?  Normocytic anemia ?  Iron deficiency anemia ?  Thrombocytopenia ([place]) ?  Acute blood loss anemia ?  ?Hemoptysis-mild intermittent since 3/22 ?Pulmonary metastatic disease: ?Right bronchus intermedius lesion-clot blocking entire airray to RML/RLL: ?s/p bronchoscopy: showed "1. 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 Blocking entire distal airay into RML and RLL".  Probably no role of IR embolization continue TXA neb, rad-onc to evaluate. CTPE 3/26 neg for PE- widespread metastatic disease to the chest with numerous large pulmonary nodules and  masses with surrounding groundglass attenuation/stable perilesional alveolar hemorrhage also extensive mediastinal and left supraclavicular lymphadenopathy.  The largest confluent mass or conglomeration of masses in the right lower lobe 6.7 x 6.7 cm  apparently in CT in [place] had largest mass 7.6 X3.2X 2 cm.  Transfuse blood, monitor and transfuse platelet. ?I provided our oncology team with patient's oncologist Dr Beatrix Fetters cell phone number ? ?Endometrial cancer diagnosed 2019 in remission until December 22 and had XRTx 30 treatment 03/21/21, chemo through 04/25/21.  CTA shows multiple pulmonary metastatic/masses , also has a rapidly growing left supraclavicular lymph node.  Hem-onc been consulted . ? ?Thrombocytopenia- plt was 59 at outside ED-s/p 1 unit. Plt downtrending s/p 1 unit platelet transfusion  3/26-improving.Monitor ?Recent Labs  ?Lab 05/20/21 ?2202 05/21/21 ?0401 05/22/21 ?0441  ?PLT 80* 66* 80*  ?  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  ?Hypokalemia- resolved ?Glaucoma-continue home eyedrops ?Hyperlipidemia-holding statin.   ?Hypertension: BP stable, will hold patient's HCTZ/metoprolol for now. ?Goals of care: Currently full code given patient's recurrent endometrial cancer with multiple pulmonary metastasis/finding on bronchoscopy-we will benefit with further goals of care discussed and would avoid further hematology oncology input.   ? ?DVT prophylaxis: Place TED hose Start: 05/20/21 0015 ?Code Status:   Code Status: Full Code ?Family Communication: plan of care discussed with patient/son at bedside. ?Patient status is: inaptient Level of care: Stepdown  ?Remains inpatient because: Ongoing management of hemoptysis ?Patient currently not stable ? ?Dispo: The patient is from: home ?  Anticipated disposition: home ? ?Mobility Assessment (last 72 hours)   ? ? Mobility Assessment   ? ? [place] Name 05/20/21 1753 05/19/21 2100  ?  ?  ?  ? Does patient have an order for bedrest or is patient medically unstable No - Continue assessment No - Continue assessment     ? What is the highest level of mobility based on the progressive mobility assessment? Level 5 (Walks with assist in room/hall) - Balance while stepping forward/back and can walk in room with assist - Complete Level 5 (Walks with assist in room/hall) - Balance while stepping forward/back and can walk in room with assist - Complete     ? ?  ?  ? ?  ?  ? ?Objective: ?Vitals last 24 hrs: ?Vitals:  ? 05/22/21 0800 05/22/21 0818 05/22/21 0824 05/22/21 0839  ?BP:  131/76  128/69  ?Pulse:  (!) 105 (!) 106 (!) 103  ?Resp:  18 (!) 25 19  ?Temp:  99.5 ?F (37.5 ?C)  99.7 ?F (37.6 ?C)  ?TempSrc:  Oral  Oral  ?SpO2:  98% 95% 96%  ?Weight: 55.9 kg     ?Height: '4\' 11"'$  (1.499 m)     ? ?Weight change:  ? ?Physical Examination: ?General exam: AA OX3, pleasant, older than stated age, weak appearing. ?HEENT:Oral mucosa moist, Ear/Nose WNL grossly, dentition normal. ?Respiratory system: bilaterally clear,no use of accessory muscle ?Cardiovascular system: S1 & S2 +, No JVD,. ?Gastrointestinal system: Abdomen soft,NT,ND,BS+ ?Nervous System:Alert, awake, moving extremities and grossly nonfocal ?Extremities: LE ankle edema none distal peripheral pulses palpable.  ?Skin: No rashes,no icterus. ?MSK: Normal muscle bulk,tone, power  ? ?Medications reviewed:  ?Scheduled Meds: ? sodium chloride   Intravenous Once  ? Chlorhexidine Gluconate Cloth  6 each Topical Daily  ? famotidine  20 mg Oral Daily  ? HYDROcodone bit-homatropine  5 mL Oral TID  ? latanoprost  1 drop Both Eyes QHS  ? senna  1 tablet Oral BID  ? sodium chloride flush  10-40 mL Intracatheter Q12H  ? sodium chloride flush  3 mL Intravenous Q12H  ? tranexamic acid  500 mg Nebulization Q8H  ? ?Continuous  Infusions: ? sodium chloride    ? dextrose 5% lactated ringers 50 mL/hr at 05/22/21 0700  ? ? ?  ?Diet Order   ? ?       ?  Diet NPO time specified Except for: Sips with Meds  Diet effective ____       ?  ? ?  ?  ? ?  ?  ? ?  ?  ?  ? ? ?Intake/Output Summary (Last 24 hours) at 05/22/2021 1000 ?Last data filed at 05/22/2021 0700 ?Gross per 24 hour  ?Intake 1055.68 ml  ?Output --  ?Net 1055.68 ml  ? ?Net IO Since Admission: 1,295.68 mL [05/22/21 1000]  ?Wt Readings from Last 3 Encounters:  ?05/22/21 55.9 kg  ?  ? ?Unresulted Labs (From admission, onward)  ? ?  Start     Ordered  ? 05/22/21 2952  Basic metabolic panel  Daily,   R     ?Question:  Specimen collection method  Answer:  IV Team=IV Team collect  ? 05/21/21 0845  ? 05/22/21 0500  CBC  Daily,   R     ?Question:  Specimen collection method  Answer:  IV Team=IV Team collect  ? 05/21/21 0845  ? 05/21/21 1007  ANCA Titers  (Anti-Neutrophilic Cystoplasmic Antibody Panel (PNL))  Once,   R       ?  Question:  Specimen collection method  Answer:  Unit=Unit collect  ? 05/21/21 1006  ? 05/21/21 1007  Glomerular basement membrane antibodies  Once,   R       ?Question:  Specimen collection method  Answer:  Unit=Unit collect  ? 05/21/21 1006  ? 05/21/21 1007  ANA, IFA (with reflex)  Once,   R       ?Question:  Specimen collection method  Answer:  Unit=Unit collect  ? 05/21/21 1006  ? 05/21/21 1007  Anti-DNA antibody, double-stranded  Once,   R       ?Question:  Specimen collection method  Answer:  Unit=Unit collect  ? 05/21/21 1006  ? 05/21/21 1007  Rheumatoid factor  Once,   R       ?Question:  Specimen collection method  Answer:  Unit=Unit collect  ? 05/21/21 1006  ? 09/73/53 2992  CYCLIC CITRUL PEPTIDE ANTIBODY, IGG/IGA  Once,   R       ?Question:  Specimen collection method  Answer:  Unit=Unit collect  ? 05/21/21 1006  ? ?  ?  ? ?  ?Data Reviewed: I have personally reviewed following labs and imaging studies ?CBC: ?Recent Labs  ?Lab June 18, 2021 ?4268 05/21/21 ?0401  05/22/21 ?0441  ?WBC 4.9 4.9 4.2  ?HGB 7.8* 7.6* 6.9*  ?HCT 23.5* 23.6* 20.9*  ?MCV 107.3* 108.3* 109.4*  ?PLT 80* 66* 80*  ? ?Basic Metabolic Panel: ?Recent Labs  ?Lab 2021/06/18 ?3419 05/21/21 ?0401 05/21/21 ?1140 03/27/2

## 2021-05-23 ENCOUNTER — Telehealth: Payer: Self-pay | Admitting: Oncology

## 2021-05-23 ENCOUNTER — Ambulatory Visit
Admit: 2021-05-23 | Discharge: 2021-05-23 | Disposition: A | Discharge status: Home / Self Care | Payer: Medicare Other | Attending: Radiation Oncology | Admitting: Radiation Oncology

## 2021-05-23 ENCOUNTER — Ambulatory Visit: Payer: Medicare Other

## 2021-05-23 ENCOUNTER — Inpatient Hospital Stay (HOSPITAL_COMMUNITY): Payer: Medicare Other

## 2021-05-23 DIAGNOSIS — I82220 Acute embolism and thrombosis of inferior vena cava: Secondary | ICD-10-CM | POA: Diagnosis not present

## 2021-05-23 DIAGNOSIS — C541 Malignant neoplasm of endometrium: Secondary | ICD-10-CM | POA: Diagnosis not present

## 2021-05-23 DIAGNOSIS — Z7189 Other specified counseling: Secondary | ICD-10-CM | POA: Diagnosis not present

## 2021-05-23 DIAGNOSIS — D62 Acute posthemorrhagic anemia: Secondary | ICD-10-CM | POA: Diagnosis not present

## 2021-05-23 DIAGNOSIS — R042 Hemoptysis: Secondary | ICD-10-CM | POA: Diagnosis not present

## 2021-05-23 LAB — CBC WITH DIFFERENTIAL/PLATELET
Abs Immature Granulocytes: 0.04 10*3/uL (ref 0.00–0.07)
Basophils Absolute: 0 10*3/uL (ref 0.0–0.1)
Basophils Relative: 0 %
Eosinophils Absolute: 0.1 10*3/uL (ref 0.0–0.5)
Eosinophils Relative: 2 %
HCT: 23.1 % — ABNORMAL LOW (ref 36.0–46.0)
Hemoglobin: 7.9 g/dL — ABNORMAL LOW (ref 12.0–15.0)
Immature Granulocytes: 1 %
Lymphocytes Relative: 8 %
Lymphs Abs: 0.4 10*3/uL — ABNORMAL LOW (ref 0.7–4.0)
MCH: 35.1 pg — ABNORMAL HIGH (ref 26.0–34.0)
MCHC: 34.2 g/dL (ref 30.0–36.0)
MCV: 102.7 fL — ABNORMAL HIGH (ref 80.0–100.0)
Monocytes Absolute: 0.5 10*3/uL (ref 0.1–1.0)
Monocytes Relative: 9 %
Neutro Abs: 3.8 10*3/uL (ref 1.7–7.7)
Neutrophils Relative %: 80 %
Platelets: 65 10*3/uL — ABNORMAL LOW (ref 150–400)
RBC: 2.25 MIL/uL — ABNORMAL LOW (ref 3.87–5.11)
RDW: 18.9 % — ABNORMAL HIGH (ref 11.5–15.5)
WBC: 4.8 10*3/uL (ref 4.0–10.5)
nRBC: 0 % (ref 0.0–0.2)

## 2021-05-23 LAB — BASIC METABOLIC PANEL
Anion gap: 6 (ref 5–15)
BUN: 7 mg/dL — ABNORMAL LOW (ref 8–23)
CO2: 24 mmol/L (ref 22–32)
Calcium: 8.4 mg/dL — ABNORMAL LOW (ref 8.9–10.3)
Chloride: 105 mmol/L (ref 98–111)
Creatinine, Ser: 0.61 mg/dL (ref 0.44–1.00)
GFR, Estimated: >60 mL/min (ref >60)
Glucose, Bld: 125 mg/dL — ABNORMAL HIGH (ref 70–99)
Potassium: 3.4 mmol/L — ABNORMAL LOW (ref 3.5–5.1)
Sodium: 135 mmol/L (ref 135–145)

## 2021-05-23 LAB — ANCA TITERS
Atypical P-ANCA titer: <1:20 {titer}
C-ANCA: <1:20 {titer}
P-ANCA: <1:20 {titer}

## 2021-05-23 LAB — ANTINUCLEAR ANTIBODIES, IFA: ANA Ab, IFA: POSITIVE — AB

## 2021-05-23 LAB — FANA STAINING PATTERNS: Homogeneous Pattern: 1 — ABNORMAL HIGH

## 2021-05-23 IMAGING — DX DG CHEST 1V PORT
1 series · 1 of 1 positions shown · non-contrast
Comparison: CT chest dated [DATE]

CLINICAL DATA: Hemoptysis

EXAM:
PORTABLE CHEST 1 VIEW

[chest ap]
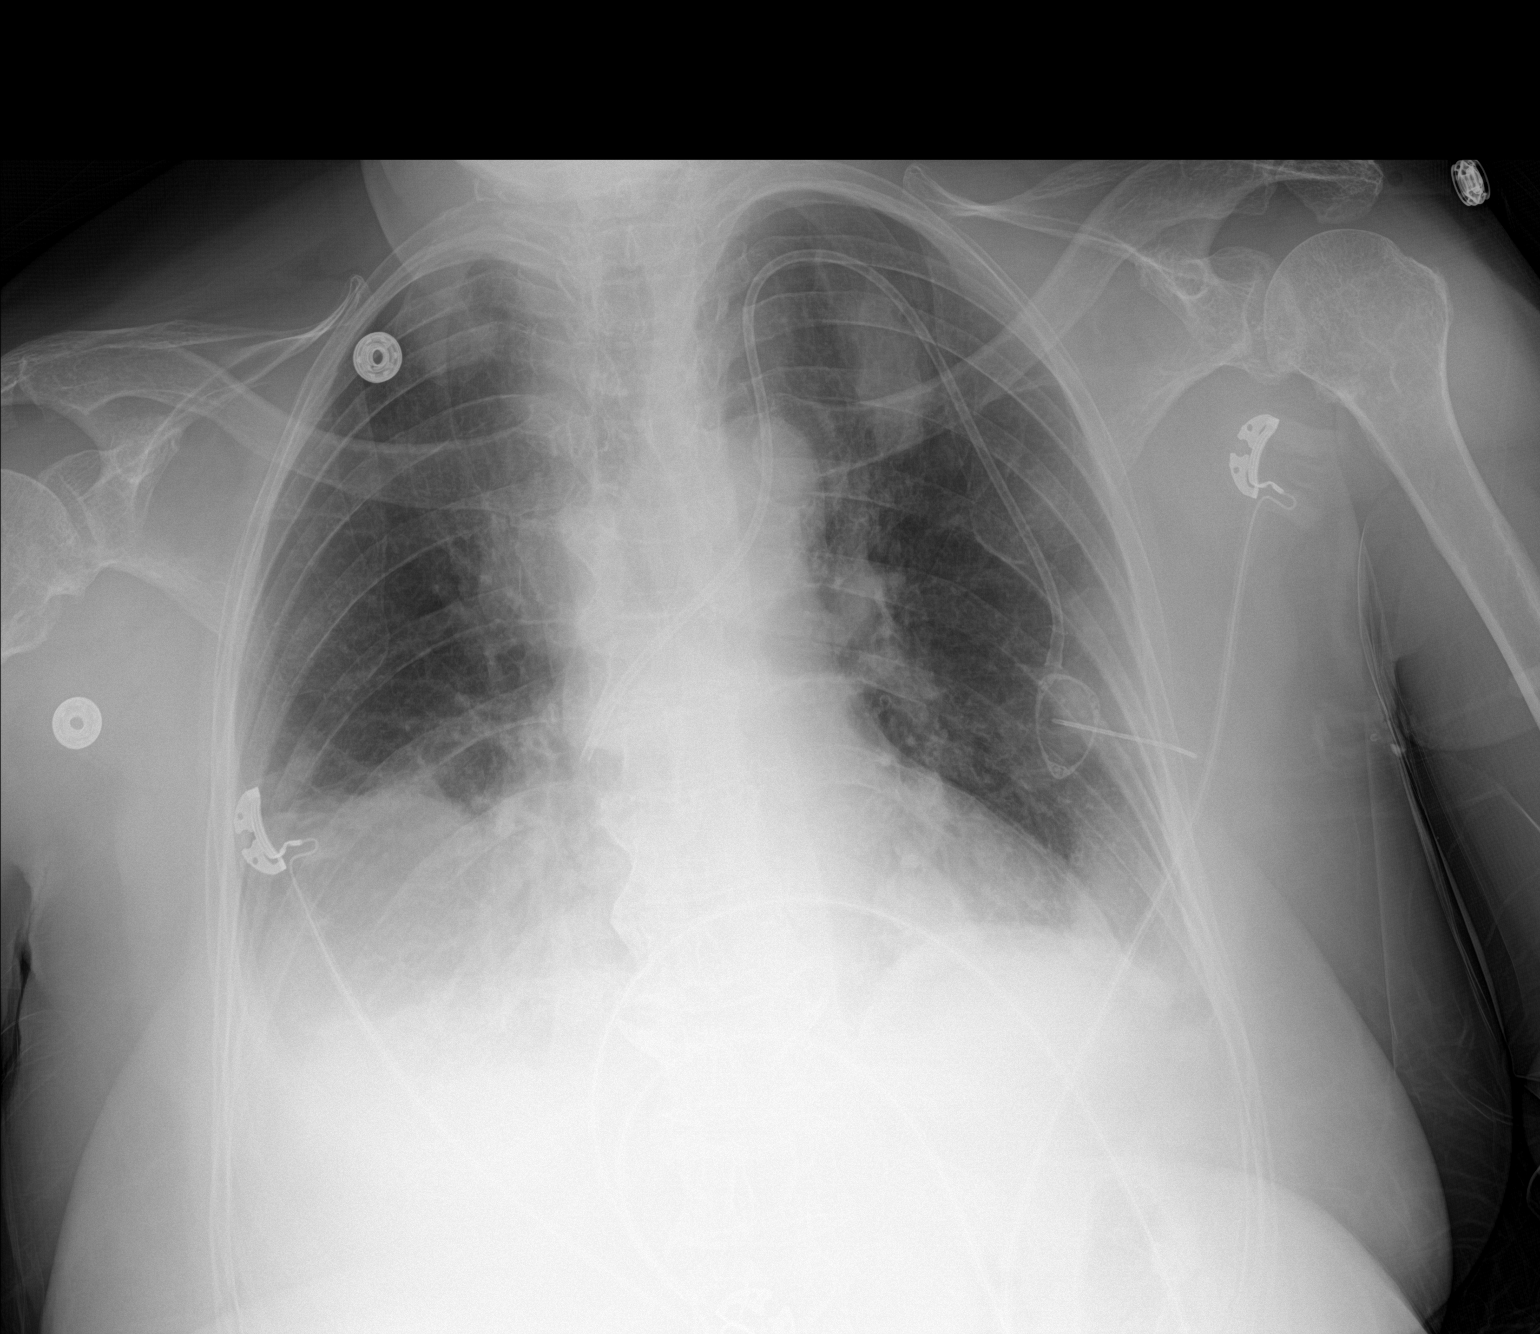

[1 of 1 positions shown; findings below may reference images not displayed]

FINDINGS: The heart size and mediastinal contours are within normal limits.
There are multiple bilateral pulmonary masses/opacities, the largest
in the right lower lobe measuring 5.4 x 6.1 cm. Left upper lobe
opacity measuring up to 3.9 cm and in the left lower lobe measuring
3.9 x 1.9 cm. No pneumothorax. Thoracic spondylosis. Left IJ access
Port-A-Cath with distal tip in the SVC.
IMPRESSION: 1. Multiple bilateral pulmonary opacities/masses, prominent in the
right lower lobe are unchanged, likely metastatic disease.

2.  No large pleural effusion or pneumothorax.

## 2021-05-23 MED ORDER — METOPROLOL TARTRATE 50 MG PO TABS
50.0000 mg | ORAL_TABLET | Freq: Two times a day (BID) | ORAL | Status: DC
  Administered 2021-05-23 – 2021-06-02 (×21): 50 mg via ORAL
  Filled 2021-05-23 (×4): qty 1
  Filled 2021-05-23 (×4): qty 2
  Filled 2021-05-23: qty 1
  Filled 2021-05-23: qty 2
  Filled 2021-05-23: qty 1
  Filled 2021-05-23: qty 2
  Filled 2021-05-23 (×2): qty 1
  Filled 2021-05-23 (×2): qty 2
  Filled 2021-05-23 (×5): qty 1

## 2021-05-23 MED ORDER — POTASSIUM CHLORIDE CRYS ER 20 MEQ PO TBCR
40.0000 meq | EXTENDED_RELEASE_TABLET | Freq: Once | ORAL | Status: AC
  Administered 2021-05-23: 40 meq via ORAL
  Filled 2021-05-23: qty 2

## 2021-05-23 NOTE — Progress Notes (Signed)
BLE venous duplex has been completed.  Preliminary findings given to Dr. Anselm Pancoast & Dr. Lupita Leash via secure messenger. ? ? ?Results can be found under chart review under CV PROC. ?05/23/2021 4:13 PM ?Faron Tudisco RVT, RDMS ? ?

## 2021-05-23 NOTE — Progress Notes (Signed)
? ?NAME:  Shanece Cochrane, MRN:  [id number], DOB:  [birth year], LOS: 4 ?ADMISSION DATE:  05/19/2021, CONSULTATION DATE:  05/20/2021 ?REFERRING MD: Antonieta Pert - [facility] CHIEF COMPLAINT:  Hemoptysis  ? ?History of Present Illness:  ?71 year old female who presented to [facility] 3/24 for hemoptysis. PMHx significant for HTN, HLD, glaucoma and endometrial CA (diagnosed 2019, s/p radiation and chemo). She has been under the care of Dr. Beatrix Fetters at [facility]; received chemo and was in remission until 01/2021 when she had recurring abdominal lymphadenopathy. ? ?Patient was doing well until 3/22 when she noticed a small amount of hemoptysis early in the morning. Recurrent hemoptysis was noted 3/23 and 3/24. She presented to the emergency department in [place], [place]. CT Chest demonstrated a mass, prompting referral to [facility] for treatment/management.   ? 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 There is one mass 7.6cm with bulky adenopathy.  CXR shows bilateral lower lobe density right greater than left. ? ?Pertinent Past Medical History:  ?Endometrial CA, glaucoma, HLD, HTN ? ?Significant Hospital Events:  ?05/19/2021 - Admit ?3/26 Bronchoscopy per Dr. Chase Caller >>  Rt bronchus intermedius lesion - clot with tumor. Blocking entire distal airay into RML and RLL, IR deferred embolization  ?3/27 Urgent Referral to radiation oncology to control bleeding. Hgb drop from 7.6 to 6.9 overnight. ?3/28 CT A/P demonstrating filling defect in infrarenal IVC c/w VTE and bulky adenopathy in retroperitoneum; RLL consolidation mass with multiple metastatic pulmonary nodules. IR consulted for IVC filter placement. XRT. ? ?Interim History / Subjective:  ?No significant events overnight ?Feeling generally well, in good spirits ?Denies further hemoptysis, CP/SOB, dyspnea.  ?Son at bedside with questions re: IVC thrombus, discussed at length ?IR consulted for IVC filter placement ?XRT today ?MRI Brain pending, repeat CT A/P as  above ? ?Objective:  ?Blood pressure (!) 105/59, pulse 95, temperature 99.3 ?F (37.4 ?C), temperature source Oral, resp. rate (!) 25, height '4\' 11"'$  (1.499 m), weight 55.9 kg, SpO2 94 %. ?   ?   ? ?Intake/Output Summary (Last 24 hours) at 05/23/2021 0734 ?Last data filed at 05/23/2021 0600 ?Gross per 24 hour  ?Intake 1325.26 ml  ?Output 2 ml  ?Net 1323.26 ml  ? ? ?Filed Weights  ? 05/22/21 0800  ?Weight: 55.9 kg  ? ?Physical Examination: ?General: Chronically ill-appearing elderly woman in NAD.  ?HEENT: [place]/AT, anicteric sclera, PERRL, moist mucous membranes. ?Neuro: Awake, oriented x 4. Responds to verbal stimuli. Following commands consistently. Moves all 4 extremities spontaneously. ?CV: RRR, no m/g/r. ?PULM: Breathing even and unlabored on RA. Lung fields significantly diminished on R, CTA on left. ?GI: Soft, nontender, nondistended. Normoactive bowel sounds. ?Extremities: No LE edema noted. ?Skin: Warm/dry, no rashes. ? ?Resolved Hospital Problem List:  ? ? ?Assessment & Plan:  ? ?Mild intermittent hemoptysis since 05/17/2021  -most recent 24/23.:  Present on admission and likely due to pulmonary metastasis from endometrial cancer ?Bronch 3/26>> Rt bronchus intermedius lesion - clot with tumor. Blocking entire distal airay into RML and RLL ?IR embolization is not an option  ?No hemoptysis since admission to ICU 3/26. ? ?- Continue TXA nebs PRN for hemoptysis ?- Continue Rad Onc treatment (targeted for hemostasis) ?- May require outpatient debulking if she remains clinically stable ?- Not a candidate for [facility] at this juncture, given bleeding risk ?- IVC filter to be placed with IR today, 3/28 ?- Intermittent CXR ?- Trend CBC (s/p 1U PRBCs 3/27, 1U Plt 3/26) ?- Transfuse for Hgb < 7 or hemodynamically significant ?-  PMT following, appreciate assistance ? ?Endometrial cancer ?Radiation oncology and medical oncology following, s/p simulation 3/27. ?- XRT today 3/28 ?- CT A/P as above (for restaging) ?- Dr. Alvy Bimler  following ? ?Thrombocytopenia ?- Trend CBC (s/p 1U PRBCs 3/27, 1U Plt 3/26) ?- Transfuse for Hgb < 7, Plt < 50 or hemodynamically significant bleeding ? ?[place] ?- Remains full code ?- Son updated at bedside 3/28 ? ?Best practice (daily eval):  ?Per Primary Team ? ?Signature:  ? ?Lestine Mount, PA-C ?[facility] ?05/23/21 7:37 AM ? ?Please see [email or web address] for pager details. ? ?From 7A-7P if no response, please call [phone] ?After hours, please call ELink [phone] ?

## 2021-05-23 NOTE — Progress Notes (Signed)
Procedure request - IVC thrombectomy ? ?71 y.o. Female inpatient. History of HTN, HLD, recurrent endometrial cancer with metasis to the lung. Admitted to [facility] for hemoptysis.  Patient had a flexible bronch on 3.26  right bronchus intermedium?s lesion clot tumor blocking entire distal airway into rml and rll. CT abd pelvis from 3. 27 read long tubular filling within the infrarenal IVC consistent with venous thrombosis. Team is requesting a IVC thrombosis.   ? ?Potassium 3.4 hgb 7.9. Patient has had 1 unit of PRBC and 1 unit of PLT transfused. All other labs and medications are within acceptable parameters. Allergies include  latex and tape. Patient is on a liquid diet last ate chicken broth approximately 0800.  ? ?Case reviewed by IR Attending Dr. Marzetta Merino. After direct discussion with the Team the decision was made to continue monitoring and medically mange at this time.  Should Patient condition change please contact IR. ?

## 2021-05-23 NOTE — Telephone Encounter (Signed)
Called Dr. Nancy Marus office and requested Caris testing results. ?

## 2021-05-23 NOTE — Progress Notes (Signed)
eLink Physician-Brief Progress Note ?Patient Name: Kristen Freeman ?DOB: [birth year] ?MRN: [id number] ? ? ?Date of Service ? 05/23/2021  ?HPI/Events of Note ? K+ 3.4  ?eICU Interventions ? KCL 40 meq po x 1 ordered.  ? ? ? ?  ? ?Kerry Kass Fe Okubo ?05/23/2021, 6:08 AM ?

## 2021-05-23 NOTE — Progress Notes (Signed)
?PROGRESS NOTE ?Kristen Freeman  SWF:[id number] DOB: 05/04/1950 DOA: 05/19/2021 ?PCP: Allie Dimmer, MD  ? ?Brief Narrative/Hospital Course: ?71 year old female with history of endometrial cancer in 2019 and in remission until Dec '22 when she was found to have recurrence, underwent XRT and chemo completing treatment 04/25/21. She had an episode of hemoptysis 05/17/21. She sought care at ED in [place]. She was found to have thrombocytopenia and did had platelet transfusion. Other Lab - Glucose 115, INR 1.1, Hgb 8.4, WBC 4.4, Plt 59, nl Diff. CTA chest - increased pulmonary mets: multiple large nodules. ? Post-obstructive consolidation with nodule 7.6x3.2x2 cm. Bulky adenopathy.EKG - nl.  She was subsequently transferred to [facility] for further evaluation and management.   ?3.26- transferred to stepdown/icu for hemoptysis recurring- s/p bronchoscopy: showed "1. 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 Blocking entire distal airay into RML and RLL" ?Rad on hemonc and palliative team were consulted 3/27  ?  ?Subjective: ?Seen and examined this morning.  Resting comfortably denies any more hemoptysis.  On room air. ?Overnight no fever, bp stable ?Labs with downtrending potassium, platelet and hemoglobin ? ?Assessment and Plan: ?Principal Problem: ?  Hemoptysis ?Active Problems: ?  Endometrial cancer ([place]) ?  HTN (hypertension) ?  HLD (hyperlipidemia) ?  Glaucoma ?  Normocytic anemia ?  Iron deficiency anemia ?  Thrombocytopenia ([place]) ?  Acute blood loss anemia ?  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  ?Hemoptysis-mild intermittent since 3/22 ?Pulmonary metastatic disease: ?Right bronchus intermedius lesion-clot blocking entire airray to RML/RLL: ?s/p bronchoscopy:showed "1. 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 Blocking entire distal airay into RML and RLL".  Probably no role of IR embolization continue TXA neb, rad-onc to evaluate. CTPE 3/26 neg for PE- widespread metastatic disease to the chest with  numerous large pulmonary nodules and masses with surrounding groundglass attenuation/stable perilesional alveolar hemorrhage also extensive mediastinal and left supraclavicular lymphadenopathy.  Transfuse platelet hemoglobin as needed.  Planning for XRT.  ? ?Endometrial cancer diagnosed 2019 in remission until December 22 and had XRTx 30 treatment 03/21/21, chemo through 04/25/21.  CTA shows multiple pulmonary metastatic/masses , also has a rapidly growing left supraclavicular lymph node. Rad on hemonc and palliative team were consulted 3/27.  CT abdomen pelvis contrast for restaging 3/27 showed infrarenal IVC filling defect, bulky adenopathy in the left retroperitoneum, peritoneal nodularity in the left adnexal region lobar consolidation in the right lower lobe.  MRI brain pending. ? ?IVC thrombosis: Discussed with Dr. Gilles Chiquito surgery pulmonary and hemo onc-unable to anticoagulate, previous cannot do surgery.  Dr. Chryl Heck planning for IR IVC filter.  Very difficult and challenging situation- there is risk of pulmonary thromboembolism with IVC CLOT-and unfortunately cannot anticoagulate. ? ?Thrombocytopenia- plt trending donw. So far  s/p 1 unit at [facility] and 1 unit on 3/26.  Will discuss with Dr. Chryl Heck. ?Recent Labs  ?Lab 05/20/21 ?2202 05/21/21 ?0401 05/22/21 ?0441 05/23/21 ?0430  ?PLT 80* 66* 80* 65*  ?  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 Monitor. ?Recent Labs  ?Lab 05/20/21 ?5427 05/21/21 ?0401 05/22/21 ?0441 05/22/21 ?1356 05/23/21 ?0430  ?HGB 7.8* 7.6* 6.9* 8.8* 7.9*  ?HCT 23.5* 23.6* 20.9* 26.5* 23.1*  ?  ?Hypokalemia- repleted ?Glaucoma-continue home eyedrops ?Hyperlipidemia-holding statin.   ?Hypertension: BP remains stable, will hold patient's HCTZ/metoprolol for now. ?Goals of care: Palliative care has been consulted.   ? ?DVT prophylaxis: Place TED hose Start: 05/20/21 0015 ?Code Status:   Code Status: Full Code ?Family  Communication: plan of care discussed with patient/son at bedside. ?Patient status is: inaptient Level of care: Stepdown  ?Remains inpatient because: Ongoing management of hemoptysis ?Patient currently not stable ? ?Dispo: The patient is from: home ?           Anticipated disposition: home ? ?Mobility Assessment (last 72 hours)   ? ? Mobility Assessment   ? ? [place] Name 05/20/21 1753  ?  ?  ?  ?  ? Does patient have an order for bedrest or is patient medically unstable No - Continue assessment      ? What is the highest level of mobility based on the progressive mobility assessment? Level 5 (Walks with assist in room/hall) - Balance while stepping forward/back and can walk in room with assist - Complete      ? ?  ?  ? ?  ?  ? ?Objective: ?Vitals last 24 hrs: ?Vitals:  ? 05/23/21 0750 05/23/21 0800 05/23/21 0900 05/23/21 1000  ?BP:  (!) 143/74 (!) 149/87 (!) 154/79  ?Pulse:  (!) 106 (!) 107 (!) 109  ?Resp:  '16 14 17  '$ ?Temp: 98.6 ?F (37 ?C)     ?TempSrc: Oral     ?SpO2:  96% 97% 97%  ?Weight:      ?Height:      ? ?Weight change:  ? ?Physical Examination: ?General exam: AA0x3,older than stated age, weak appearing. ?HEENT:Oral mucosa moist, Ear/Nose WNL grossly, dentition normal. ?Respiratory system: bilaterally diminished at the bases more on the right ?Cardiovascular system: S1 & S2 +, No JVD,. ?Gastrointestinal system: Abdomen soft,NT,ND, BS+ ?Nervous System:Alert, awake, moving extremities and grossly nonfocal ?Extremities: edema neg,distal peripheral pulses palpable.  ?Skin: No rashes,no icterus. ?MSK: Normal muscle bulk,tone, power ? ?Medications reviewed:  ?Scheduled Meds: ? Chlorhexidine Gluconate Cloth  6 each Topical Daily  ? famotidine  20 mg Oral Daily  ? HYDROcodone bit-homatropine  5 mL Oral TID  ? latanoprost  1 drop Both Eyes QHS  ? senna  1 tablet Oral BID  ? sodium chloride flush  10-40 mL Intracatheter Q12H  ? sodium chloride flush  3 mL Intravenous Q12H  ? tranexamic acid  500 mg Nebulization Q8H   ? ?Continuous Infusions: ? sodium chloride    ? dextrose 5% lactated ringers 50 mL/hr at 05/23/21 1610  ? ? ?  ?Diet Order   ? ?       ?  Diet NPO time specified  Diet effective now       ?  ? ?  ?  ? ?  ?  ? ?  ?  ?  ? ? ?Intake/Output Summary (Last 24 hours) at 05/23/2021 1038 ?Last data filed at 05/23/2021 0932 ?Gross per 24 hour  ?Intake 1335.26 ml  ?Output 2 ml  ?Net 1333.26 ml  ? ?Net IO Since Admission: 2,628.94 mL [05/23/21 1038]  ?Wt Readings from Last 3 Encounters:  ?05/22/21 55.9 kg  ?  ? ?Unresulted Labs (From admission, onward)  ? ?  Start     Ordered  ? 05/22/21 9604  Basic metabolic panel  Daily,   R     ?Question:  Specimen collection method  Answer:  IV Team=IV Team collect  ? 05/21/21 0845  ? 05/22/21 0500  CBC  Daily,   R     ?Question:  Specimen collection method  Answer:  IV Team=IV Team collect  ? 05/21/21 0845  ? 05/21/21 1007  ANCA Titers  (Anti-Neutrophilic Cystoplasmic Antibody Panel (PNL))  Once,  R       ?Question:  Specimen collection method  Answer:  Unit=Unit collect  ? 05/21/21 1006  ? ?  ?  ? ?  ?Data Reviewed: I have personally reviewed following labs and imaging studies ?CBC: ?Recent Labs  ?Lab 2021/05/25 ?7353 05/21/21 ?0401 05/22/21 ?0441 05/22/21 ?1356 05/23/21 ?0430  ?WBC 4.9 4.9 4.2  --  4.8  ?NEUTROABS  --   --   --   --  3.8  ?HGB 7.8* 7.6* 6.9* 8.8* 7.9*  ?HCT 23.5* 23.6* 20.9* 26.5* 23.1*  ?MCV 107.3* 108.3* 109.4*  --  102.7*  ?PLT 80* 66* 80*  --  65*  ? ?Basic Metabolic Panel: ?Recent Labs  ?Lab May 25, 2021 ?2992 05/21/21 ?0401 05/21/21 ?1140 05/22/21 ?0441 05/23/21 ?0430  ?NA 138 136  --  140 135  ?K 3.4* 3.6  --  3.6 3.4*  ?CL 106 106  --  110 105  ?CO2 25 24  --  25 24  ?GLUCOSE 122* 127*  --  131* 125*  ?BUN 15 14  --  11 7*  ?CREATININE 0.72 0.64  --  0.53 0.61  ?CALCIUM 8.7* 8.7*  --  8.7* 8.4*  ?MG  --   --  2.2  --   --   ?PHOS  --   --  3.3  --   --   ? ?GFR: ?Estimated Creatinine Clearance: 48.5 mL/min (by C-G formula based on SCr of 0.61 mg/dL). ?Liver Function  Tests: ?Recent Labs  ?Lab 05/21/21 ?1140  ?AST 27  ?ALT 23  ?ALKPHOS 67  ?BILITOT 0.8  ?PROT 6.7  ?ALBUMIN 3.3*  ? ?No results for input(s): LIPASE, AMYLASE in the last 168 hours. ?No results for input(s): AMMONI

## 2021-05-23 NOTE — Progress Notes (Signed)
? ?                                                                                                                                                     ?                                                   ?Daily Progress Note  ? ?Patient Name: Kristen Freeman       Date: 05/23/2021 ?DOB: 1950-07-22  Age: 71 y.o. MRN#: [id number] ?Attending Physician: Antonieta Pert, MD ?Primary Care Physician: Allie Dimmer, MD ?Admit Date: 05/19/2021 ? ?Reason for Consultation/Follow-up: Establishing goals of care ? ?Patient Profile/HPI: 71 y.o. female  with past medical history of endometrial cancer s/p chemotherapy and radiation- had just completed chemotherapy 3 weeks ago admitted on 05/19/2021 with hemoptysis and acute blood loss anemia. CT scan shows widespread metastatic disease in the lungs and possible alveolar hemmorhage. Bronch completed- biopsy unable to be obtained due to friable tumor. She has tumor clot blocking her RLL and RML distal airways. Radiation consulted and plans are being made for palliative radiation therapy, medical oncology consult is pending. Palliative medicine consulted for goals of care.    ? ?Subjective: ?Chart reviewed including imaging, labs, progress notes.  ?On eval Dr. Alvy Bimler at bedside discussing plan of care with patient. ?Dr. Alvy Bimler discussed code status- patient verbalized her wishes that she would want attempts at resuscitation, but if her condition looked not to be improving she would not want prolonged artificial life support.  ?I met with patient- answered her questions. We discussed the necessity to continue to discuss her goals of care with her family. She understands that a day may come when she wishes to change her code status and stop aggressive therapies- she notes she went through that process with her mother.  ?She is very faithful- we discussed her hope that her condition will improve. She also discussed that sometimes it is necessary to change what is hoped for- for example if there  is suffering, sometimes hopes change from healing to relief of suffering.  ? ?Review of Systems  ?Constitutional:  Positive for malaise/fatigue.  ?Respiratory:  Positive for hemoptysis.   ?Psychiatric/Behavioral:  Negative for depression. The patient is not nervous/anxious.   ? ? ?Physical Exam ?Vitals and nursing note reviewed.  ?Constitutional:   ?   Appearance: Normal appearance.  ?Cardiovascular:  ?   Rate and Rhythm: Normal rate.  ?Pulmonary:  ?   Effort: Pulmonary effort is normal.  ?Neurological:  ?   Mental Status: She is alert and oriented to person, place, and time.  ?Psychiatric:  ?   Comments: Good judgement and insight  ?         ? ?  Vital Signs: BP (!) 149/125   Pulse (!) 118   Temp 99.1 ?F (37.3 ?C) (Oral)   Resp (!) 22   Ht 4' 11"  (1.499 m)   Wt 55.9 kg   SpO2 95%   BMI 24.89 kg/m?  ?SpO2: SpO2: 95 % ?O2 Device: O2 Device: Room Air ?O2 Flow Rate: O2 Flow Rate (L/min): 2 L/min ? ?Intake/output summary:  ?Intake/Output Summary (Last 24 hours) at 05/23/2021 1526 ?Last data filed at 05/23/2021 1102 ?Gross per 24 hour  ?Intake 1150.38 ml  ?Output 2 ml  ?Net 1148.38 ml  ? ?LBM: Last BM Date : 05/21/21 ?Baseline Weight: Weight: 55.9 kg ?Most recent weight: Weight: 55.9 kg ? ?     ?Palliative Assessment/Data: PPS: 60% ? ? ? ? ? ?Patient Active Problem List  ? Diagnosis Date Noted  ? 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 Acute blood loss anemia 05/22/2021  ? Lung metastases ([place]) 05/22/2021  ? HLD (hyperlipidemia) 05/20/2021  ? Glaucoma 05/20/2021  ? Hemoptysis 05/20/2021  ? Normocytic anemia 05/20/2021  ? Endometrial cancer ([place]) 05/19/2021  ? HTN (hypertension) 05/19/2021  ? Thrombocytopenia ([place]) 07/12/2020  ? Prediabetes 08/02/2016  ? Iron deficiency anemia 04/04/2000  ? ? ?Palliative Care Assessment & Plan  ? ? ?Assessment/Recommendations/Plan ? ?Continue current plan of care ?Full code ?Will continue to follow ? ? ?Code Status: ?Full code ? ?Prognosis: ? Unable to determine ? ?Discharge Planning: ?To  Be Determined ? ?Care plan was discussed with patient and care team. ? ?Thank you for allowing the Palliative Medicine Team to assist in the care of this patient. ? ? ?Mariana Kaufman, AGNP-C ?Palliative Medicine ? ? ?Please contact Palliative Medicine Team phone at [phone] for questions and concerns.  ? ? ? ? ? ? ?

## 2021-05-23 NOTE — Progress Notes (Signed)
Attending: ?  ?Reviewed and discussed with [place] Crane PA-C ? ?Subjective: ?No hemoptysis ?Breathing is OK ? ?Objective: ?Vitals:  ? 05/23/21 0800 05/23/21 0900 05/23/21 1000 05/23/21 1100  ?BP: (!) 143/74 (!) 149/87 (!) 154/79 (!) 145/76  ?Pulse: (!) 106 (!) 107 (!) 109 (!) 104  ?Resp: '16 14 17 '$ (!) 23  ?Temp:      ?TempSrc:      ?SpO2: 96% 97% 97% 96%  ?Weight:      ?Height:      ? ?  ? ?Intake/Output Summary (Last 24 hours) at 05/23/2021 1137 ?Last data filed at 05/23/2021 1102 ?Gross per 24 hour  ?Intake 1270.38 ml  ?Output 2 ml  ?Net 1268.38 ml  ? ? ?General:  Resting comfortably in bed ?HENT: NCAT OP clear ?PULM: CTA B, normal effort ?CV: RRR, no mgr ?GI: BS+, soft, nontender ?MSK: normal bulk and tone ?Neuro: awake, alert, no distress, MAEW ? ? ? ?CBC ?   ?Component Value Date/Time  ? WBC 4.8 05/23/2021 0430  ? RBC 2.25 (L) 05/23/2021 0430  ? HGB 7.9 (L) 05/23/2021 0430  ? HCT 23.1 (L) 05/23/2021 0430  ? PLT 65 (L) 05/23/2021 0430  ? MCV 102.7 (H) 05/23/2021 0430  ? MCH 35.1 (H) 05/23/2021 0430  ? MCHC 34.2 05/23/2021 0430  ? RDW 18.9 (H) 05/23/2021 0430  ? LYMPHSABS 0.4 (L) 05/23/2021 0430  ? MONOABS 0.5 05/23/2021 0430  ? EOSABS 0.1 05/23/2021 0430  ? BASOSABS 0.0 05/23/2021 0430  ? ? ?BMET ?   ?Component Value Date/Time  ? NA 135 05/23/2021 0430  ? K 3.4 (L) 05/23/2021 0430  ? CL 105 05/23/2021 0430  ? CO2 24 05/23/2021 0430  ? GLUCOSE 125 (H) 05/23/2021 0430  ? BUN 7 (L) 05/23/2021 0430  ? CREATININE 0.61 05/23/2021 0430  ? CALCIUM 8.4 (L) 05/23/2021 0430  ? GFRNONAA >60 05/23/2021 0430  ? ? ? ? ?Impression/Plan: ?IVC clot > would consult IR for IVC filter, retrievable then hopefully use anticoagulation after several weeks of cancer treatment ?Hemoptysis due to metastatic endometrial cancer> XRT to continue, monitor for bleeding, if massive hemoptysis then place with right lung down and plan intubation. ? ?Rest per PA note ? ?My cc time n/a minutes ? ?Roselie Awkward, MD ?[facility] ?Pager:  [phone] ?Cell: [phone] ?After 7pm: [phone] ? ?

## 2021-05-23 NOTE — Discharge Planning (Signed)
Oncology Discharge Planning Admission Note ? ?[facility] at [facility] ?Address: [address], [place], [place] [address] ?Hours of Operation:  8am - 5pm, Monday - Friday  ?Clinic Contact Information:  [phone] ? ?Oncology Care Team: ?Medical Oncologist:   ? ?Myrtha Mantis - NP is aware of this hospital admission dated 05/19/21 and has assessed patient at bedside.  The cancer center will follow Kristen Freeman?s inpatient care to assist with discharge planning as indicated by the oncologist.  We will arrange any necessary outpatient follow up prior to discharge if feasible. ? ?Disclaimer:  This [place] note does not imply a formal consult request has been made by the admitting attending for this admission or there will be an inpatient consult completed by oncology.  Please request oncology consults as per standard process as indicated. ?

## 2021-05-24 ENCOUNTER — Ambulatory Visit
Admit: 2021-05-24 | Discharge: 2021-05-24 | Disposition: A | Discharge status: Home / Self Care | Payer: Medicare Other | Attending: Radiation Oncology | Admitting: Radiation Oncology

## 2021-05-24 ENCOUNTER — Inpatient Hospital Stay (HOSPITAL_COMMUNITY): Payer: Medicare Other

## 2021-05-24 DIAGNOSIS — R042 Hemoptysis: Secondary | ICD-10-CM | POA: Diagnosis not present

## 2021-05-24 DIAGNOSIS — D62 Acute posthemorrhagic anemia: Secondary | ICD-10-CM | POA: Diagnosis not present

## 2021-05-24 DIAGNOSIS — D696 Thrombocytopenia, unspecified: Secondary | ICD-10-CM

## 2021-05-24 DIAGNOSIS — D649 Anemia, unspecified: Secondary | ICD-10-CM

## 2021-05-24 DIAGNOSIS — I10 Essential (primary) hypertension: Secondary | ICD-10-CM

## 2021-05-24 DIAGNOSIS — C541 Malignant neoplasm of endometrium: Secondary | ICD-10-CM | POA: Diagnosis not present

## 2021-05-24 LAB — RETICULOCYTES
Immature Retic Fract: 22.3 % — ABNORMAL HIGH (ref 2.3–15.9)
RBC.: 2.37 MIL/uL — ABNORMAL LOW (ref 3.87–5.11)
Retic Count, Absolute: 96.2 10*3/uL (ref 19.0–186.0)
Retic Ct Pct: 4.1 % — ABNORMAL HIGH (ref 0.4–3.1)

## 2021-05-24 LAB — CBC
HCT: 23.8 % — ABNORMAL LOW (ref 36.0–46.0)
Hemoglobin: 7.9 g/dL — ABNORMAL LOW (ref 12.0–15.0)
MCH: 34.5 pg — ABNORMAL HIGH (ref 26.0–34.0)
MCHC: 33.2 g/dL (ref 30.0–36.0)
MCV: 103.9 fL — ABNORMAL HIGH (ref 80.0–100.0)
Platelets: 57 10*3/uL — ABNORMAL LOW (ref 150–400)
RBC: 2.29 MIL/uL — ABNORMAL LOW (ref 3.87–5.11)
RDW: 17.6 % — ABNORMAL HIGH (ref 11.5–15.5)
WBC: 5.8 10*3/uL (ref 4.0–10.5)
nRBC: 0 % (ref 0.0–0.2)

## 2021-05-24 LAB — VITAMIN B12: Vitamin B-12: 1223 pg/mL — ABNORMAL HIGH (ref 180–914)

## 2021-05-24 LAB — PREPARE RBC (CROSSMATCH)

## 2021-05-24 LAB — HEMOGLOBIN AND HEMATOCRIT, BLOOD
HCT: 26.1 % — ABNORMAL LOW (ref 36.0–46.0)
Hemoglobin: 9 g/dL — ABNORMAL LOW (ref 12.0–15.0)

## 2021-05-24 LAB — BASIC METABOLIC PANEL
Anion gap: 5 (ref 5–15)
BUN: 10 mg/dL (ref 8–23)
CO2: 24 mmol/L (ref 22–32)
Calcium: 8.6 mg/dL — ABNORMAL LOW (ref 8.9–10.3)
Chloride: 105 mmol/L (ref 98–111)
Creatinine, Ser: 0.64 mg/dL (ref 0.44–1.00)
GFR, Estimated: >60 mL/min (ref >60)
Glucose, Bld: 114 mg/dL — ABNORMAL HIGH (ref 70–99)
Potassium: 3.9 mmol/L (ref 3.5–5.1)
Sodium: 134 mmol/L — ABNORMAL LOW (ref 135–145)

## 2021-05-24 LAB — IRON AND TIBC
Iron: 29 ug/dL (ref 28–170)
Saturation Ratios: 14 % (ref 10.4–31.8)
TIBC: 214 ug/dL — ABNORMAL LOW (ref 250–450)
UIBC: 185 ug/dL

## 2021-05-24 LAB — FERRITIN: Ferritin: 660 ng/mL — ABNORMAL HIGH (ref 11–307)

## 2021-05-24 IMAGING — MR MR HEAD WO/W CM
13 series · 48 of 48 positions shown · IV contrast (gadavist)
Comparison: None.

CLINICAL DATA: Endometrial cancer, evaluate for intracranial
metastatic disease

EXAM:
MRI HEAD WITHOUT AND WITH CONTRAST
TECHNIQUE: Multiplanar, multiecho pulse sequences of the brain and surrounding
structures were obtained without and with intravenous contrast.
CONTRAST:  6mL GADAVIST GADOBUTROL 1 MMOL/ML IV SOLN

[Series 5: DWI · axial · 3.0mm · 1.36mm/px · z∈[-42,+94]mm · 5 of 96 slices shown (1 of 2)]
[im 1/96]
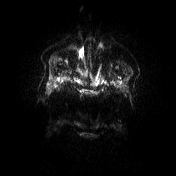
[im 24/96]
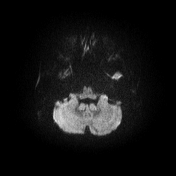
[im 48/96]
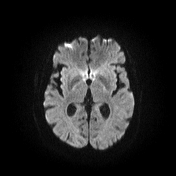
[im 72/96]
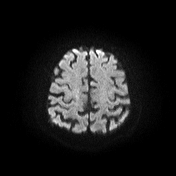
[im 96/96]
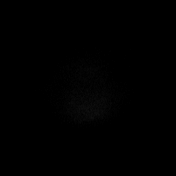

[Series 6: DWI · axial · 3.0mm · 1.36mm/px · z∈[-42,+88]mm · 3 of 46 slices shown (2 of 2)]
[im 1/46]
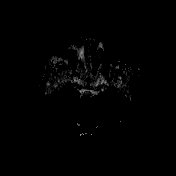
[im 23/46]
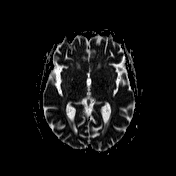
[im 46/46]
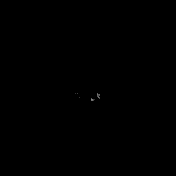

[Series 7: T1 · sagittal · 5.0mm · 0.75mm/px · 2 of 24 slices shown (1 of 2)]
[im 1/24]
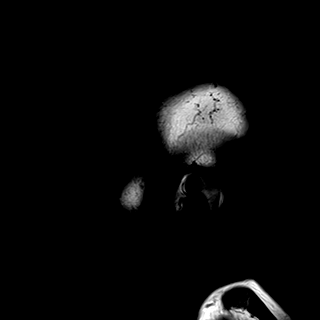
[im 24/24]
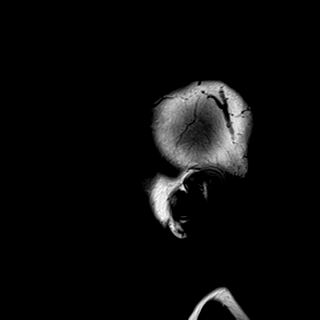

[Series 8: T2 · axial · 5.0mm · 0.62mm/px · z∈[-46,+98]mm · 2 of 24 slices shown]
[im 1/24]
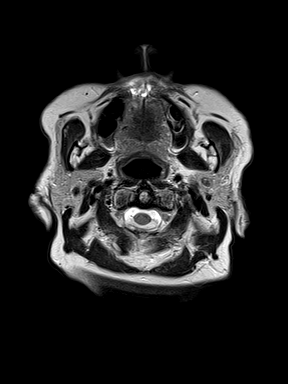
[im 24/24]
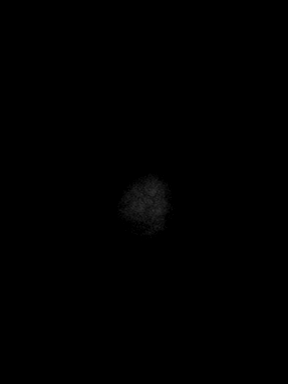

[Series 9: swi_images · axial · 3.0mm · 0.75mm/px · z∈[-54,+105]mm · 4 of 56 slices shown]
[im 1/56]
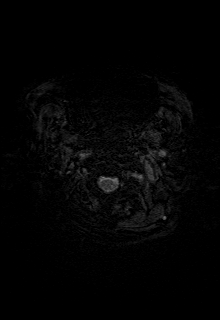
[im 19/56]
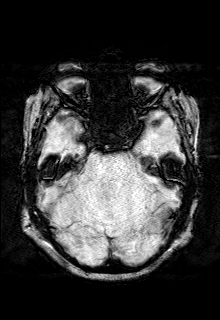
[im 37/56]
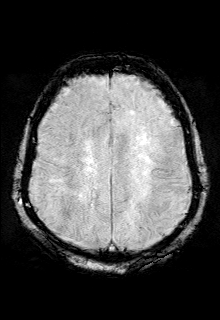
[im 56/56]
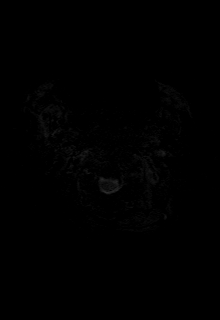

[Series 11: FLAIR · axial · 3.0mm · 0.75mm/px · z∈[-48,+100]mm · 3 of 52 slices shown]
[im 1/52]
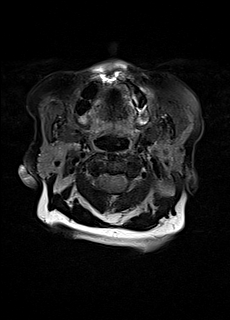
[im 26/52]
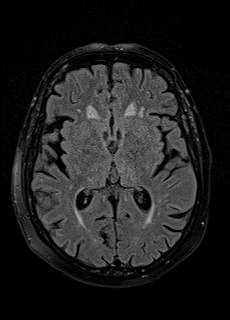
[im 52/52]
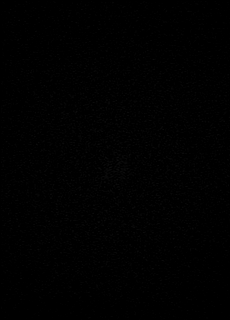

[Series 12: T1 · axial · 1.0mm · 0.94mm/px · z∈[-43,+94]mm · 9 of 144 slices shown (2 of 2)]
[im 1/144]
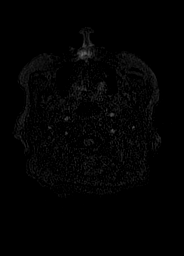
[im 18/144]
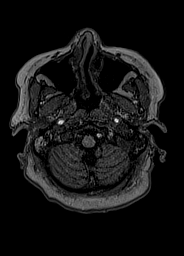
[im 36/144]
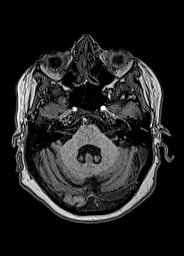
[im 54/144]
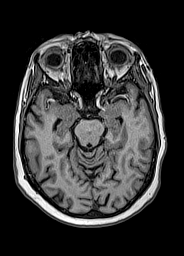
[im 72/144]
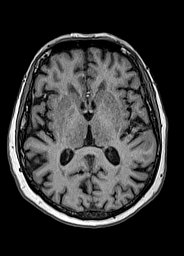
[im 90/144]
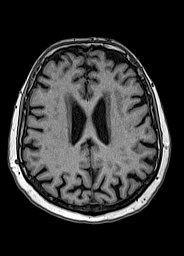
[im 108/144]
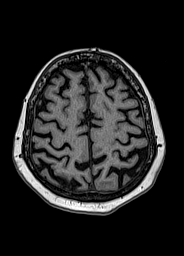
[im 126/144]
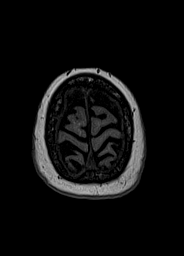
[im 144/144]
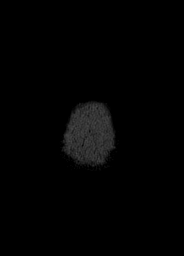

[Series 13: cor dwi_tracew · coronal · 5.0mm · 1.53mm/px · 3 of 54 slices shown]
[im 1/54]
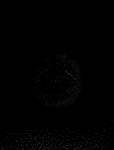
[im 27/54]
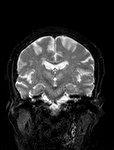
[im 54/54]
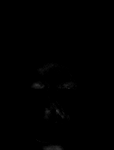

[Series 14: cor dwi_adc · coronal · 5.0mm · 1.53mm/px · 2 of 27 slices shown]
[im 1/27]
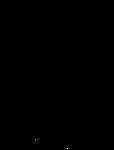
[im 27/27]
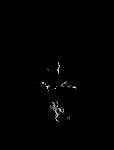

[Series 15: T2 post-contrast · coronal · 5.0mm · 0.57mm/px · 2 of 28 slices shown]
[im 1/28]
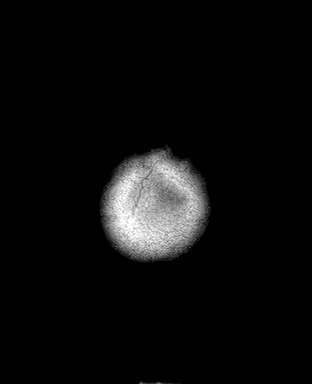
[im 28/28]
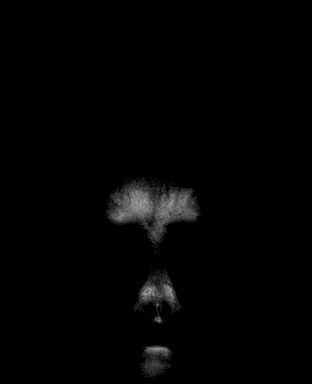

[Series 16: T1 post-contrast · axial · 1.0mm · 0.94mm/px · z∈[-43,+94]mm · 9 of 144 slices shown (1 of 3)]
[im 1/144]
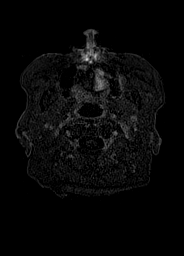
[im 18/144]
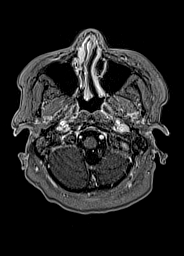
[im 36/144]
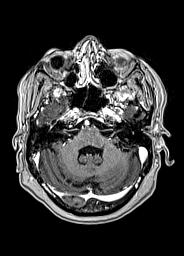
[im 54/144]
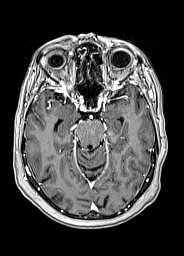
[im 72/144]
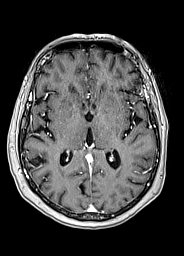
[im 90/144]
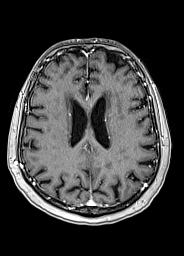
[im 108/144]
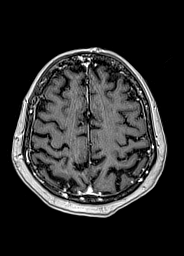
[im 126/144]
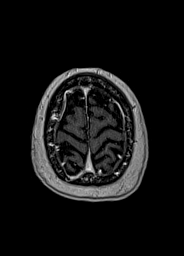
[im 144/144]
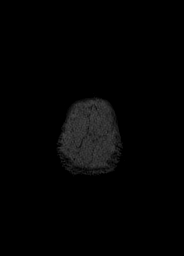

[Series 17: T1 post-contrast · coronal · 5.0mm · 0.43mm/px · 2 of 28 slices shown (2 of 3)]
[im 1/28]
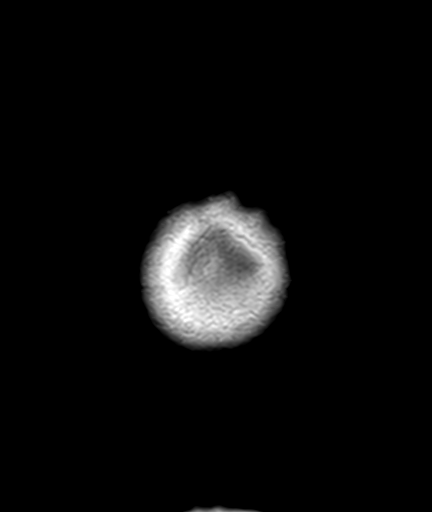
[im 28/28]
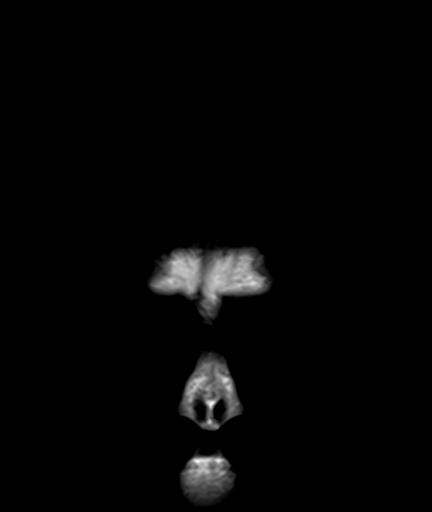

[Series 18: T1 post-contrast · sagittal · 5.0mm · 0.75mm/px · 2 of 24 slices shown (3 of 3)]
[im 1/24]
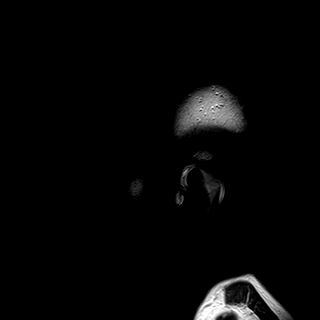
[im 24/24]
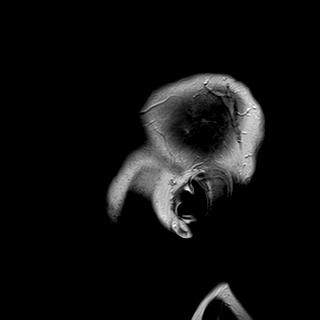

[48 of 48 positions shown; findings below may reference images not displayed]

FINDINGS: Brain: There is no evidence of acute intracranial hemorrhage,
extra-axial fluid collection, or acute infarct.

Background parenchymal volume is normal for age. The ventricles are
normal in size. Patchy foci of FLAIR signal abnormality in the
subcortical and periventricular white matter likely reflects sequela
of chronic white matter microangiopathy.

There is no suspicious parenchymal signal abnormality. There is no
abnormal enhancement. There is no mass lesion. There is no mass
effect or midline shift.

Vascular: Normal flow voids.

Skull and upper cervical spine: Normal marrow signal.

Sinuses/Orbits: The paranasal sinuses are clear. The globes and
orbits are unremarkable.

Other: There is a right mastoid effusion.
IMPRESSION: No evidence of intracranial metastatic disease.

## 2021-05-24 MED ORDER — FOLIC ACID 1 MG PO TABS
1.0000 mg | ORAL_TABLET | Freq: Every day | ORAL | Status: DC
  Administered 2021-05-24 – 2021-06-02 (×10): 1 mg via ORAL
  Filled 2021-05-24 (×10): qty 1

## 2021-05-24 MED ORDER — DIPHENHYDRAMINE HCL 25 MG PO CAPS
25.0000 mg | ORAL_CAPSULE | Freq: Once | ORAL | Status: AC
  Administered 2021-05-24: 25 mg via ORAL
  Filled 2021-05-24: qty 1

## 2021-05-24 MED ORDER — SODIUM CHLORIDE 0.9% IV SOLUTION: Freq: Once | INTRAVENOUS | Status: AC

## 2021-05-24 MED ORDER — GADOBUTROL 1 MMOL/ML IV SOLN
6.0000 mL | Freq: Once | INTRAVENOUS | Status: AC | PRN
  Administered 2021-05-24: 6 mL via INTRAVENOUS

## 2021-05-24 MED ORDER — ACETAMINOPHEN 325 MG PO TABS
650.0000 mg | ORAL_TABLET | Freq: Once | ORAL | Status: AC
  Administered 2021-05-24: 650 mg via ORAL
  Filled 2021-05-24: qty 2

## 2021-05-24 NOTE — Progress Notes (Incomplete)
Attempted to call report to 6th floor, no one answered ?

## 2021-05-24 NOTE — Progress Notes (Signed)
Kristen Freeman   DOB:1950/07/31   DQ#:[id number]   ? 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  According to outside records, additional molecular profile/testing has been sent, results showed no detectable actionable mutations, ER/PR neg, PD-L1 neg.   ?She is made aware she has stage IV metastatic cancer, treatment is strictly palliative ?MRI imaging of the brain is pending ?Palliative radiation to her lungs is in progress ?Due to cardiovascular instability, severe pancytopenia and recent bleeding, she is not stable to start palliative chemo right now ?I will assess on a day-to-day basis but assuming she has no further hemoptysis, I will arrange for her to receive first dose of inpatient chemotherapy next Monday on April 3 ?Hopefully, she could be transition out of the ICU to a regular floor ?We discussed risks, benefits, side effects of combination of paclitaxel with carboplatin versus pembrolizumab with lenvatinib ?Due to high risk for bleeding and clotting, pembrolizumab with lenvatinib is not a good option right now ?Continue supportive care ?  ?Hemoptysis, resolved ?Secondary to lung metastasis ?She is undergoing palliative radiation therapy to her chest ?Continue supportive care ?  ?IVC thrombus ?CT abdomen/pelvis shows a filling defect in the infrarenal IVC and bilateral lower extremity Doppler ultrasound confirmed DVT  ?unable to anticoagulate secondary to recent hemoptysis ?IR as well as vascular surgery have been requested for consideration of thrombectomy versus IVC filter placement, overall, deemed not a candidate for thrombectomy or  IVC filter placement ?I reviewed the danger of not treating her IVC thrombus with the patient and family ?Due to her significant bleeding risk, at this point, we have no choice but to observe ?If she stop having the hemoptysis for at least 3 days in a row, we could consider gentle anticoagulation therapy with IV heparin, possibly tomorrow ?I will reassess daily ?  ?Macrocytic anemia ?Anemia due to recent hemoptysis ?Received 1 unit PRBCs on 05/22/2021 ?Hemoglobin improved following PRBC transfusion but she remained tachycardic at rest ?I will order additional work-up but I plan to give her a unit of blood transfusion in preparation for chemotherapy next week and recent signs of myocardial ischemia ?We discussed some of the risks, benefits, and alternatives of blood transfusions. The patient is symptomatic from anemia and the hemoglobin level is critically low.  Some of the side-effects to be expected including risks of transfusion reactions, chills, infection, syndrome of volume overload and risk of hospitalization from various reasons and the patient is willing to proceed and went ahead to sign consent today. ?I also plan to order iron studies, B12 and start her on supplements today ?  ?Thrombocytopenia ?Unclear etiology but may be consumptive secondary to recent bleeding ?Her platelet count from outside facility was normal ?Received 1 unit of platelets on 05/21/2021 due to active bleeding ?Monitor closely ?I recommend keeping platelet count at 50,000 or above ?  ?Goals of care ?I have a long discussion with patient and family in regards to [place] and goals of care ?She would like to receive aggressive care as much as possible ?In the event of cardiorespiratory arrest, she would like to be resuscitated but she does not want to be maintained on life support indefinitely ?Her husband and 2 sons are her  dedicated healthcare power of attorney ?  ?Discharge planning ?I am hopeful she can be transition out of the ICU  within the next 24 to 48 hours, preferably to 6 E. in preparation for chemotherapy ?I will reassess and return tomorrow morning ? ?All questions were answered. The patient knows to call the clinic with any problems, questions or concerns. ?  ?The total time spent in the appointment was 80 minutes encounter with patients including review of chart and various tests results, discussions about plan of care and coordination of care plan ? ?Heath Lark, MD ?05/24/2021 9:05 AM ? ?Subjective:  ?She is feeling well.  Her son, [name] is by the bedside ?Her 2 other children available over the phone to answer questions ?She complains of fatigue ?Denies sensation of chest pain or shortness of breath ?No further hemoptysis or other signs of bleeding ? ?Objective:  ?Vitals:  ? 05/24/21 0700 05/24/21 0800  ?BP: 113/66 137/78  ?Pulse: 100 (!) 105  ?Resp: (!) 29 17  ?Temp:    ?SpO2: 95% 96%  ?  ? ?Intake/Output Summary (Last 24 hours) at 05/24/2021 0905 ?Last data filed at 05/24/2021 0300 ?Gross per 24 hour  ?Intake 260.12 ml  ?Output 700 ml  ?Net -439.88 ml  ? ? ?GENERAL:alert, no distress and comfortable ?NEURO: alert & oriented x 3 with fluent speech, no focal motor/sensory deficits ?  ?Labs:  ?Recent Labs  ?  05/21/21 ?1140 05/22/21 ?0441 05/23/21 ?0430 05/24/21 ?0500  ?NA  --  140 135 134*  ?K  --  3.6 3.4* 3.9  ?CL  --  110 105 105  ?CO2  --  25 24 24   ?GLUCOSE  --  131* 125* 114*  ?BUN  --  11 7* 10  ?CREATININE  --  0.53 0.61 0.64  ?CALCIUM  --  8.7* 8.4* 8.6*  ?GFRNONAA  --  >60 >60 >60  ?PROT 6.7  --   --   --   ?ALBUMIN 3.3*  --   --   --   ?AST 27  --   --   --   ?ALT 23  --   --   --   ?ALKPHOS 67  --   --   --   ?BILITOT 0.8  --   --   --   ?BILIDIR 0.2  --   --   --   ?IBILI 0.6  --   --   --   ? ? ?Studies:  ?CT Angio Chest Pulmonary Embolism (PE) W or WO Contrast ? ?Result Date: 05/21/2021 ?CLINICAL DATA:  71 year old female with history of abnormal chest x-ray. Endometrial cancer. Hemoptysis. * Tracking Code: BO * EXAM:  CT ANGIOGRAPHY CHEST WITH CONTRAST TECHNIQUE: Multidetector CT imaging of the chest was performed using the standard protocol during bolus administration of intravenous contrast. Multiplanar CT image reconstructions and MIPs were obtained to evaluate the vascular anatomy. RADIATION DOSE REDUCTION: This exam was performed according to the departmental dose-optimization program which includes automated exposure control, adjustment of the mA and/or kV according to patient size and/or use of iterative reconstruction technique. CONTRAST:  64m OMNIPAQUE IOHEXOL 350 MG/ML SOLN COMPARISON:  No priors. FINDINGS: Comment: Today's study is limited by considerable patient respiratory motion. Cardiovascular: There are no central, lobar or segmental sized filling defects within the pulmonary arterial tree to suggest pulmonary embolism. Smaller distal subsegmental sized emboli can not be entirely excluded on the basis of today's examination. Heart size is normal. There is no significant pericardial fluid,  thickening or pericardial calcification. There is aortic atherosclerosis, as well as atherosclerosis of the great vessels of the mediastinum and the coronary arteries, including calcified atherosclerotic plaque in the left anterior descending coronary arteries. Left internal jugular single-lumen porta cath with tip terminating in the right atrium. Mediastinum/Nodes: There is extensive lymphadenopathy most evident in the superior mediastinum and left supraclavicular region. The largest left supraclavicular lymph nodes measure 2.2 cm in short axis on axial images 5 and 12 of series 4. The largest superior mediastinal nodal mass measures 5.1 x 2.3 cm (axial image 35 of series 4) anterior to the origin of the great vessels. High right paratracheal lymph node measuring 1.6 cm in short axis. Prominent but nonenlarged bilateral hilar lymph nodes are noted. Middle mediastinal lymphadenopathy measuring up to 2.2 cm in short axis adjacent  to the descending thoracic aorta (axial image 96 of series 4). Right retrocrural lymph node (axial image 109 of series 4) measuring 1.9 cm in short axis. Esophagus is unremarkable in appearance. No

## 2021-05-24 NOTE — Progress Notes (Signed)
? ?NAME:  Kristen Freeman, MRN:  [id number], DOB:  12-17-1950, LOS: 5 ?ADMISSION DATE:  05/19/2021, CONSULTATION DATE:  05/20/2021 ?REFERRING MD: Antonieta Pert - [facility] CHIEF COMPLAINT:  Hemoptysis  ? ?History of Present Illness:  ?71 year old female who presented to [facility] 3/24 for hemoptysis. PMHx significant for HTN, HLD, glaucoma and endometrial CA (diagnosed 2019, s/p radiation and chemo). She has been under the care of Dr. Beatrix Fetters at [facility]; received chemo and was in remission until 01/2021 when she had recurring abdominal lymphadenopathy. ? ?Patient was doing well until 3/22 when she noticed a small amount of hemoptysis early in the morning. Recurrent hemoptysis was noted 3/23 and 3/24. She presented to the emergency department in [place], [place]. CT Chest demonstrated a mass, prompting referral to [facility] for treatment/management.   ? 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 There is one mass 7.6cm with bulky adenopathy.  CXR shows bilateral lower lobe density right greater than left. ? ?Pertinent Past Medical History:  ?Endometrial CA, glaucoma, HLD, HTN ? ?Significant Hospital Events:  ?05/19/2021 - Admit ?3/26 Bronchoscopy per Dr. Chase Caller >>  Rt bronchus intermedius lesion - clot with tumor. Blocking entire distal airay into RML and RLL, IR deferred embolization  ?3/27 Urgent Referral to radiation oncology to control bleeding. Hgb drop from 7.6 to 6.9 overnight. ?3/28 CT A/P demonstrating filling defect in infrarenal IVC c/w VTE and bulky adenopathy in retroperitoneum; RLL consolidation mass with multiple metastatic pulmonary nodules. IR consulted for IVC filter placement. XRT. ?3/29 MRI brain negative for malignancy ? ?Interim History / Subjective:  ?No acute events overnight. No complaints. No hemoptysis since Sunday 3/26. ? ?Objective:  ?Blood pressure 132/70, pulse (!) 110, temperature 99.7 ?F (37.6 ?C), resp. rate 17, height '4\' 11"'$  (1.499 m), weight 55.9 kg, SpO2 96 %. ?   ?    ? ?Intake/Output Summary (Last 24 hours) at 05/24/2021 1019 ?Last data filed at 05/24/2021 [phone] ?Gross per 24 hour  ?Intake 260.12 ml  ?Output 700 ml  ?Net -439.88 ml  ? ? ?Filed Weights  ? 05/22/21 0800  ?Weight: 55.9 kg  ? ?Physical Examination: ?General: elderly appearing female in NAD ?HEENT: [place]/AT, PERRL, no JVD ?Neuro: Alert, oriented, non-focal ?CV: RRR, no MRG ?PULM: unlabored. Clear. Coughs with deep breathing.  ?GI: Soft, NT, ND ?Extremities: no acute deformity ?Skin: grossly intact  ? ?Resolved Hospital Problem List:  ? ? ?Assessment & Plan:  ? ?Mild intermittent hemoptysis since 05/17/2021:  Present on admission. Due to pulmonary metastasis from endometrial cancer. Bronchoscopy 3/26 with large lesion at bronchus intermedius blocking airway to RML and RLL. IR embolization was not an option. No hemoptysis since admission to ICU 3/26. ?- Continue Rad Onc treatment (targeted for hemostasis) ?- PRN TXA neb ?- May require outpatient debulking if she remains clinically stable ?- Not a candidate for [facility] at this juncture, given bleeding risk ?- IVC filter to be placed with IR today, 3/28 ?- Intermittent CXR ?- Trend CBC (s/p 1U PRBCs 3/27, 1U Plt 3/26) ?- Transfuse for Hgb < 7 or hemodynamically significant ?- PMT following, appreciate assistance ? 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  ?- not a candidate for IVC filter due to location of IVC thrombus ?- Not currently a candidate for anticoagulation due to hemoptysis ?- Tomorrow we will start prophylactic heparin dosing and if tolerated will we can advance to full AC over the weekend.  ? ?Endometrial cancer: rapidly progressing now including metastasis to at least the lungs and  now likely intraabdominal mets. MRI brain negative. ?Radiation oncology and medical oncology following, s/p simulation 3/27. ?- XRT as above ?- Dr. Alvy Bimler following. Hopeful for initiation of palliative chemo sometime next week.  ? ?Thrombocytopenia ?- Trend CBC  (s/p 1U PRBCs 3/27, 1U Plt 3/26) ?- Transfuse for Hgb < 7, Plt < 50 or hemodynamically significant bleeding ? ?[place] ?- Remains full code ?- Family updated 3/29 at bedside ? ?Best practice (daily eval):  ?Per Primary Team ? ?Signature:  ? ? ?Georgann Housekeeper, AGACNP-BC ?[facility] ? ?See Am[place] for personal pager ?PCCM on call pager [phone] until 7pm. ?Please call Elink 7p-7a. [phone] ? ?05/24/2021 10:29 AM ? ? ? ? ?

## 2021-05-24 NOTE — Progress Notes (Signed)
START OFF PATHWAY REGIMEN - Uterine ? ? ?[id number]:Carboplatin AUC=5 IV D1 + Paclitaxel 135 mg/m2 IV D1 q21 Days: ?  A cycle is every 21 days: ?    Paclitaxel  ?    Carboplatin  ? ?**Always confirm dose/schedule in your pharmacy ordering system** ? ?Patient Characteristics: ?High Grade Undifferentiated/Leiomyosarcoma, Recurrent/Progressive Disease, Medically Inoperable, Second Line, Relapse ? 12 Months From Prior Therapy ?Histology: High Grade Undifferentiated/Leiomyosarcoma ?Therapeutic Status: Recurrent or Progressive Disease ?Surgical Status: Medically Inoperable ?Line of Therapy: Second Line ?Time to Recurrence: Relapse ? 12 Months From Prior Therapy ?Intent of Therapy: ?Non-Curative / Palliative Intent, Discussed with Patient ?

## 2021-05-24 NOTE — Progress Notes (Signed)
? ?PROGRESS NOTE ? ? ? ?Kristen Freeman  YSA:[id number] DOB: 10/13/1950 DOA: 05/19/2021 ?PCP: Allie Dimmer, MD ? ? ?Brief Narrative: ?71 year old female with history of endometrial cancer in 2019 and in remission until Dec '22 when she was found to have recurrence, underwent XRT and chemo completing treatment 04/25/21. She had an episode of hemoptysis 05/17/21. She sought care at ED in [place]. She was found to have thrombocytopenia and did had platelet transfusion. Other Lab - Glucose 115, INR 1.1, Hgb 8.4, WBC 4.4, Plt 59, nl Diff. CTA chest - increased pulmonary mets: multiple large nodules. ? Post-obstructive consolidation with nodule 7.6x3.2x2 cm. Bulky adenopathy.EKG - nl.  She was subsequently transferred to [facility] for further evaluation and management.   ?3.26- transferred to stepdown/icu for hemoptysis recurring- s/p bronchoscopy: showed "1. 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 Blocking entire distal airay into RML and RLL" ?Rad on hemonc and palliative team were consulted 3/27 ? ? ?Assessment and Plan: ? ?Hemoptysis ?Secondary to right bronchus intermedius tumor with associated clot. Recommendation to not pursue IR embolization, but rather consideration of radiation oncology for tumor debulking. Radiation oncology consulted and have initiated plan for radiation treatments ? ?Right bronchus intermedius lesion ?Noted on bronchoscopy.  ? ?IVC thrombus ?Noted on CT abdomen/pelvis, confirmed by venous duplex. Complicated by recent hemoptysis. IR consulted for consideration of thrombectomy/IVC filter, but recommended against both at this time with preference not to place a suprarenal IVC filter. ?-Observe with plans to start Heparin IV and eventual transition to long term anticoagulation if patient tolerates without recurrent hemoptysis ? ?Metastatic endometrial cancer ?Lung metastasis ?Lymph node metastasis ?Patient is followed by gynecology/oncology as an outpatient for prior  endometrial cancer diagnosis. Hematology/oncology consulted this admission for consideration of palliative chemotherapy. ? ?Thrombocytopenia ?Slightly downward drift. Hematology/oncology recommending goal of >50,000. 1 unit of platelets transfused on 3/26 ? ?Anemia ?Likely chronic component in setting of metastatic cancer. Patient with hemoglobin down to 6.9 secondary to hemoptysis. 1 unit of PRBC given on 3/27 and another on 3/29. Hemoglobin currently stable. ? ?Hypokalemia ?Mild. Resolved with repletion. ? ?Glaucoma ?-Continue latanoprost ? ?Primary hypertension ?Hydrochlorothiazide and metoprolol initially held. Metoprolol restarted. Blood pressure stable. ?-Continue metoprolol ? ?DVT prophylaxis: SCDs ?Code Status:   Code Status: Full Code ?Family Communication: Husband and two sons at bedside ?Disposition Plan: Discharge home vs SNF pending ability to start anticoagulation, stability of hemoglobin/platelets, specialist recommendations ? ? ?Consultants:  ?Medical oncology ?PCCM ?Radiation oncology ?Interventional radiology ? ?Procedures:  ?Flexible bronchoscopy (05/21/2021) ? ?Antimicrobials: ?None  ? ? ?Subjective: ?Patient reports some cough but no hemoptysis. ? ?Objective: ?BP 138/79   Pulse 94   Temp 98 ?F (36.7 ?C) (Oral)   Resp (!) 22   Ht _0  (1.499 m)   Wt 55.9 kg   SpO2 99%   BMI 24.89 kg/m?  ? ?Examination: ? ?General exam: Appears calm and comfortable ?Respiratory system: Respiratory effort normal. ?Gastrointestinal system: Abdomen is non-distended. ?Central nervous system: Alert and oriented. ?Psychiatry: Judgement and insight appear normal. Mood & affect appropriate.  ? ? ?Data Reviewed: I have personally reviewed following labs and imaging studies ? ?CBC ?Lab Results  ?Component Value Date  ? WBC 5.8 05/24/2021  ? RBC 2.37 (L) 05/24/2021  ? HGB 9.0 (L) 05/24/2021  ? HCT 26.1 (L) 05/24/2021  ? MCV 103.9 (H) 05/24/2021  ? MCH 34.5 (H) 05/24/2021  ? PLT 57 (L) 05/24/2021  ? MCHC 33.2  05/24/2021  ? RDW 17.6 (H) 05/24/2021  ?  LYMPHSABS 0.4 (L) 05/23/2021  ? MONOABS 0.5 05/23/2021  ? EOSABS 0.1 05/23/2021  ? BASOSABS 0.0 05/23/2021  ? ? ? ?Last metabolic panel ?Lab Results  ?Component Value Date  ? NA 134 (L) 05/24/2021  ? K 3.9 05/24/2021  ? CL 105 05/24/2021  ? CO2 24 05/24/2021  ? BUN 10 05/24/2021  ? CREATININE 0.64 05/24/2021  ? GLUCOSE 114 (H) 05/24/2021  ? GFRNONAA >60 05/24/2021  ? CALCIUM 8.6 (L) 05/24/2021  ? PHOS 3.3 05/21/2021  ? PROT 6.7 05/21/2021  ? ALBUMIN 3.3 (L) 05/21/2021  ? BILITOT 0.8 05/21/2021  ? ALKPHOS 67 05/21/2021  ? AST 27 05/21/2021  ? ALT 23 05/21/2021  ? ANIONGAP 5 05/24/2021  ? ? ?GFR: ?Estimated Creatinine Clearance: 48.5 mL/min (by C-G formula based on SCr of 0.64 mg/dL). ? ?Recent Results (from the past 240 hour(s))  ?MRSA Next Gen by PCR, Nasal     Status: None  ? Collection Time: 05/21/21  9:02 AM  ? Specimen: Nasal Mucosa; Nasal Swab  ?Result Value Ref Range Status  ? MRSA by PCR Next Gen NOT DETECTED NOT DETECTED Final  ?  Comment: (NOTE) ?The GeneXpert MRSA Assay (FDA approved for NASAL specimens only), ?is one component of a comprehensive MRSA colonization surveillance ?program. It is not intended to diagnose MRSA infection nor to guide ?or monitor treatment for MRSA infections. ?Test performance is not FDA approved in patients less than 2 years ?old. ?Performed at [facility], [address] ?[place], [place] [address] ?  ?  ? ? ?Radiology Studies: ?MR BRAIN W WO CONTRAST ? ?Result Date: 05/24/2021 ?CLINICAL DATA:  Endometrial cancer, evaluate for intracranial metastatic disease EXAM: MRI HEAD WITHOUT AND WITH CONTRAST TECHNIQUE: Multiplanar, multiecho pulse sequences of the brain and surrounding structures were obtained without and with intravenous contrast. CONTRAST:  75m GADAVIST GADOBUTROL 1 MMOL/ML IV SOLN COMPARISON:  None. FINDINGS: Brain: There is no evidence of acute intracranial hemorrhage, extra-axial fluid collection, or  acute infarct. Background parenchymal volume is normal for age. The ventricles are normal in size. Patchy foci of FLAIR signal abnormality in the subcortical and periventricular white matter likely reflects sequela of chronic white matter microangiopathy. There is no suspicious parenchymal signal abnormality. There is no abnormal enhancement. There is no mass lesion. There is no mass effect or midline shift. Vascular: Normal flow voids. Skull and upper cervical spine: Normal marrow signal. Sinuses/Orbits: The paranasal sinuses are clear. The globes and orbits are unremarkable. Other: There is a right mastoid effusion. IMPRESSION: No evidence of intracranial metastatic disease. Electronically Signed   By: PValetta MoleM.D.   On: 05/24/2021 09:43  ? ?CT ABDOMEN PELVIS W CONTRAST ? ?Addendum Date: 05/23/2021   ?ADDENDUM REPORT: 05/23/2021 12:25 ADDENDUM: Correction in VASCULAR / LYMPHATIC findings section: Large long tubular filling defect within the IVC  consistent with thrombus extending from the confluence of the iliac veins to the level of the renal veins (image 51/5) Electronically Signed   By: SSuzy BouchardM.D.   On: 05/23/2021 12:25  ? ?Result Date: 05/23/2021 ?CLINICAL DATA:  Endometrial carcinoma.  Lung metastasis. EXAM: CT ABDOMEN AND PELVIS WITH CONTRAST TECHNIQUE: Multidetector CT imaging of the abdomen and pelvis was performed using the standard protocol following bolus administration of intravenous contrast. RADIATION DOSE REDUCTION: This exam was performed according to the departmental dose-optimization program which includes automated exposure control, adjustment of the mA and/or kV according to patient size and/or use of iterative reconstruction technique. CONTRAST:  1062mOMNIPAQUE  IOHEXOL 300 MG/ML  SOLN COMPARISON:  Chest CT 05/21/2021 FINDINGS: Lower chest: Large LEFT lobe mass and lobar consolidation again noted. Multiple round pulmonary nodular metastasis in LEFT and RIGHT lung base unchanged  in 1 day interval. Hepatobiliary: No focal hepatic lesion. No biliary duct dilatation. Common bile duct is normal. Pancreas: Pancreas is normal. No ductal dilatation. No pancreatic inflammation. Spleen: Normal spleen Adrenals

## 2021-05-25 ENCOUNTER — Ambulatory Visit
Admit: 2021-05-25 | Discharge: 2021-05-25 | Disposition: A | Discharge status: Home / Self Care | Payer: Medicare Other | Attending: Radiation Oncology | Admitting: Radiation Oncology

## 2021-05-25 DIAGNOSIS — I10 Essential (primary) hypertension: Secondary | ICD-10-CM | POA: Diagnosis not present

## 2021-05-25 DIAGNOSIS — R042 Hemoptysis: Secondary | ICD-10-CM | POA: Diagnosis not present

## 2021-05-25 DIAGNOSIS — D62 Acute posthemorrhagic anemia: Secondary | ICD-10-CM | POA: Diagnosis not present

## 2021-05-25 DIAGNOSIS — C541 Malignant neoplasm of endometrium: Secondary | ICD-10-CM | POA: Diagnosis not present

## 2021-05-25 LAB — BPAM RBC
Blood Product Expiration Date: 202304092359
Blood Product Expiration Date: 202304202359
ISSUE DATE / TIME: 202303270813
ISSUE DATE / TIME: 202303291150
Unit Type and Rh: 5100
Unit Type and Rh: 5100

## 2021-05-25 LAB — TYPE AND SCREEN
ABO/RH(D): O POS
Antibody Screen: NEGATIVE
Unit division: 0
Unit division: 0

## 2021-05-25 LAB — CBC
HCT: 26.5 % — ABNORMAL LOW (ref 36.0–46.0)
Hemoglobin: 8.9 g/dL — ABNORMAL LOW (ref 12.0–15.0)
MCH: 33.1 pg (ref 26.0–34.0)
MCHC: 33.6 g/dL (ref 30.0–36.0)
MCV: 98.5 fL (ref 80.0–100.0)
Platelets: 45 10*3/uL — ABNORMAL LOW (ref 150–400)
RBC: 2.69 MIL/uL — ABNORMAL LOW (ref 3.87–5.11)
RDW: 17.6 % — ABNORMAL HIGH (ref 11.5–15.5)
WBC: 5.3 10*3/uL (ref 4.0–10.5)
nRBC: 0 % (ref 0.0–0.2)

## 2021-05-25 LAB — PROCALCITONIN: Procalcitonin: <0.1 ng/mL

## 2021-05-25 LAB — VITAMIN D 25 HYDROXY (VIT D DEFICIENCY, FRACTURES): Vit D, 25-Hydroxy: 43.23 ng/mL (ref 30–100)

## 2021-05-25 MED ORDER — DEXAMETHASONE 4 MG PO TABS
12.0000 mg | ORAL_TABLET | Freq: Two times a day (BID) | ORAL | Status: AC
  Administered 2021-05-28: 12 mg via ORAL
  Filled 2021-05-25: qty 6

## 2021-05-25 NOTE — Progress Notes (Signed)
Kristen Freeman   DOB:06-Jan-1951   BW#:[id number]   ? 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  According to outside records, additional molecular profile/testing has been sent, results showed no detectable actionable mutations, ER/PR neg, PD-L1 neg.   ?She is made aware she has stage IV metastatic cancer, treatment is strictly palliative ?MRI imaging of the brain is negative for disease ?Palliative radiation to her lungs is in progress ?Due to cardiovascular instability, severe pancytopenia and recent bleeding, she is not stable to start palliative chemo right now ?I have arranged for her to receive first dose of inpatient chemotherapy next Monday on April 3 ?We discussed risks, benefits, side effects of combination of paclitaxel with carboplatin versus pembrolizumab with lenvatinib ?Due to high risk for bleeding and clotting, pembrolizumab with lenvatinib is not a good option right now ?Continue supportive care ?I will order premed dexamethasone for Sunday evening and Monday morning ?  ?Hemoptysis, resolved ?Secondary to lung metastasis ?She is undergoing palliative radiation therapy to her chest ?Continue supportive care ?  ?IVC thrombus ?CT abdomen/pelvis shows a filling defect in the infrarenal IVC and bilateral lower extremity Doppler ultrasound confirmed DVT  ?unable to anticoagulate secondary to recent hemoptysis ?IR as well as vascular surgery have been requested for consideration of thrombectomy versus IVC filter placement, overall, deemed not a candidate for thrombectomy or IVC filter placement ?I reviewed the danger of not treating  her IVC thrombus with the patient and family ?Due to her significant bleeding risk, at this point, we have no choice but to observe ?She has no further hemoptysis for at least 3 days in a row ?My plan is to start anticoagulation therapy today, however, due to worsening thrombocytopenia, this plan is placed on hold for now ?If her platelet count started to trend over 50,000, we will start and initiate anticoagulation therapy with IV heparin without bolus ?  ?Macrocytic anemia ?Anemia due to recent hemoptysis ?Received 1 unit PRBCs on 05/22/2021 and on 05/24/2021 ?Serum iron and B12 level were within normal range ?I have started her on folic acid supplementation ?  ?Thrombocytopenia ?Likely due to recent chemoradiation therapy in [place] as well as consumptive secondary to recent bleeding  ?Received 1 unit of platelets on 05/21/2021 due to active bleeding ?Monitor closely ?I recommend keeping platelet count at 50,000 or above. ?Due to absence of hemoptysis, I am okay deferring plan for platelet transfusion today but if it drops further tomorrow, I recommend a unit of platelet ?  ?Goals of care ?I have a long discussion with patient and family in regards to [place] and goals of care ?She would like to receive aggressive care as much as possible ?In the event of cardiorespiratory arrest, she would like to be resuscitated but she does not want to be maintained on life support indefinitely ?Her husband and 2 sons are her dedicated healthcare power of attorney ?  ?Discharge planning ?She is not safe for discharge due to pancytopenia, ongoing radiation, and treated thrombosis and aggressive disease ?The risk of premature discharge is risk of death ?The plan over the next few days will be to continue radiation therapy and attempt starting her on anticoagulation therapy with IV heparin without bolus ?Transfuse  as needed ?She needs inpatient chemotherapy to commence while hospitalized, plan for April 3 ?I am hopeful, she can  be discharged either end of the day next week on Monday or early Tuesday ?I will not be around tomorrow, we will get my partners to round on her daily ? ?All questions were answered. The patient knows to call the clinic with any problems, questions or concerns. ?  ?Heath Lark, MD ?05/25/2021 9:25 AM ? ?Subjective:  ?The patient is moved out of ICU ?No further hemoptysis ?Denies chest pain or shortness of breath ?She is able to tolerate liquid diet without difficulties ?The patient and her son have multiple questions related to plan of care ? ?Objective:  ?Vitals:  ? 05/24/21 2229 05/25/21 0302  ?BP: 126/71 108/73  ?Pulse: 92 96  ?Resp: 18 18  ?Temp: (!) 101.3 ?F (38.5 ?C) 98.8 ?F (37.1 ?C)  ?SpO2: 98% 97%  ?  ? ?Intake/Output Summary (Last 24 hours) at 05/25/2021 0925 ?Last data filed at 05/24/2021 1511 ?Gross per 24 hour  ?Intake 357.67 ml  ?Output --  ?Net 357.67 ml  ? ? ?GENERAL:alert, no distress and comfortable ?NEURO: alert & oriented x 3 with fluent speech, no focal motor/sensory deficits ?  ?Labs:  ?Recent Labs  ?  05/21/21 ?1140 05/22/21 ?0441 05/23/21 ?0430 05/24/21 ?0500  ?NA  --  140 135 134*  ?K  --  3.6 3.4* 3.9  ?CL  --  110 105 105  ?CO2  --  25 24 24   ?GLUCOSE  --  131* 125* 114*  ?BUN  --  11 7* 10  ?CREATININE  --  0.53 0.61 0.64  ?CALCIUM  --  8.7* 8.4* 8.6*  ?GFRNONAA  --  >60 >60 >60  ?PROT 6.7  --   --   --   ?ALBUMIN 3.3*  --   --   --   ?AST 27  --   --   --   ?ALT 23  --   --   --   ?ALKPHOS 67  --   --   --   ?BILITOT 0.8  --   --   --   ?BILIDIR 0.2  --   --   --   ?IBILI 0.6  --   --   --   ? ? ?Studies:  ?CT Angio Chest Pulmonary Embolism (PE) W or WO Contrast ? ?Result Date: 05/21/2021 ?CLINICAL DATA:  71 year old female with history of abnormal chest x-ray. Endometrial cancer. Hemoptysis. * Tracking Code: BO * EXAM: CT ANGIOGRAPHY CHEST WITH CONTRAST TECHNIQUE: Multidetector CT imaging of the chest was performed using the standard protocol during bolus administration of intravenous  contrast. Multiplanar CT image reconstructions and MIPs were obtained to evaluate the vascular anatomy. RADIATION DOSE REDUCTION: This exam was performed according to the departmental dose-optimization program which includes automated exposure control, adjustment of the mA and/or kV according to patient size and/or use of iterative reconstruction technique. CONTRAST:  15m OMNIPAQUE IOHEXOL 350 MG/ML SOLN COMPARISON:  No priors. FINDINGS: Comment: Today's study is limited by considerable patient respiratory motion. Cardiovascular: There are no central, lobar or segmental sized filling defects within the pulmonary arterial tree to suggest pulmonary embolism. Smaller distal subsegmental sized emboli can not be entirely excluded on the basis of today's examination. Heart size is normal. There is no significant pericardial fluid, thickening or pericardial calcification. There is aortic atherosclerosis, as well as atherosclerosis of the great vessels of the mediastinum and the coronary arteries, including calcified atherosclerotic  plaque in the left anterior descending coronary arteries. Left internal jugular single-lumen porta cath with tip terminating in the right atrium. Mediastinum/Nodes: There is extensive lymphadenopathy most evident in the superior mediastinum and left supraclavicular region. The largest left supraclavicular lymph nodes measure 2.2 cm in short axis on axial images 5 and 12 of series 4. The largest superior mediastinal nodal mass measures 5.1 x 2.3 cm (axial image 35 of series 4) anterior to the origin of the great vessels. High right paratracheal lymph node measuring 1.6 cm in short axis. Prominent but nonenlarged bilateral hilar lymph nodes are noted. Middle mediastinal lymphadenopathy measuring up to 2.2 cm in short axis adjacent to the descending thoracic aorta (axial image 96 of series 4). Right retrocrural lymph node (axial image 109 of series 4) measuring 1.9 cm in short axis. Esophagus is  unremarkable in appearance. No axillary lymphadenopathy. Lungs/Pleura: Numerous pulmonary nodules and masses are noted throughout the lungs bilaterally. The largest confluent mass or conglomeration of masses

## 2021-05-25 NOTE — Progress Notes (Signed)
? ?PROGRESS NOTE ? ? ? ?Kristen Freeman  UVO:[id number] DOB: 1950-04-18 DOA: 05/19/2021 ?PCP: Allie Dimmer, MD ? ? ?Brief Narrative: ?71 year old female with history of endometrial cancer in 2019 and in remission until Dec '22 when she was found to have recurrence, underwent XRT and chemo completing treatment 04/25/21. She had an episode of hemoptysis 05/17/21. She sought care at ED in [place]. She was found to have thrombocytopenia and did had platelet transfusion. Other Lab - Glucose 115, INR 1.1, Hgb 8.4, WBC 4.4, Plt 59, nl Diff. CTA chest - increased pulmonary mets: multiple large nodules. ? Post-obstructive consolidation with nodule 7.6x3.2x2 cm. Bulky adenopathy.EKG - nl.  She was subsequently transferred to [facility] for further evaluation and management.   ?3.26- transferred to stepdown/icu for hemoptysis recurring- s/p bronchoscopy: showed "1. 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 Blocking entire distal airay into RML and RLL" ?Rad on hemonc and palliative team were consulted 3/27 ? ? ?Assessment and Plan: ? ?Hemoptysis ?Secondary to right bronchus intermedius tumor with associated clot. Recommendation to not pursue IR embolization, but rather consideration of radiation oncology for tumor debulking. Radiation oncology consulted and have initiated plan for radiation treatments x10. Treatment course started on 3/27. ? ?Right bronchus intermedius lesion ?Noted on bronchoscopy.  ? ?IVC thrombus ?Noted on CT abdomen/pelvis, confirmed by venous duplex. Complicated by recent hemoptysis. IR consulted for consideration of thrombectomy/IVC filter, but recommended against both at this time with preference not to place a suprarenal IVC filter. Heparin planned, however now deferred secondary to worsening thrombocytopenia ?-Observe with plans to start Heparin IV and eventual transition to long term anticoagulation if patient tolerates without recurrent hemoptysis and thrombocytopenia  improves ? ?Metastatic endometrial cancer ?Lung metastasis ?Lymph node metastasis ?Patient is followed by gynecology/oncology as an outpatient for prior endometrial cancer diagnosis. Hematology/oncology consulted this admission for consideration of palliative chemotherapy starting 4/3 ? ?Thrombocytopenia ?Slightly downward drift. Hematology/oncology recommending goal of >50,000. 1 unit of platelets transfused on 3/26 ? ?Anemia ?Likely chronic component in setting of metastatic cancer. Patient with hemoglobin down to 6.9 secondary to hemoptysis. 1 unit of PRBC given on 3/27 and another on 3/29. Hemoglobin currently stable. ? ?Hypokalemia ?Mild. Resolved with repletion. ? ?Glaucoma ?-Continue latanoprost ? ?Primary hypertension ?Hydrochlorothiazide and metoprolol initially held. Metoprolol restarted. Blood pressure stable. ?-Continue metoprolol ? ? ?DVT prophylaxis: SCDs ?Code Status:   Code Status: Full Code ?Family Communication: Son at bedside ?Disposition Plan: Discharge home vs SNF pending ability to start anticoagulation, stability of hemoglobin/platelets, specialist recommendations ? ? ?Consultants:  ?Medical oncology ?PCCM ?Radiation oncology ?Interventional radiology ? ?Procedures:  ?Flexible bronchoscopy (05/21/2021) ? ?Antimicrobials: ?None  ? ? ?Subjective: ?Continues to have some coughing but without hemoptysis. No other issues. ? ?Objective: ?BP 139/80 (BP Location: Left Arm)   Pulse 98   Temp 99.4 ?F (37.4 ?C) (Oral)   Resp 16   Ht _0  (1.499 m)   Wt 55.8 kg   SpO2 98%   BMI 24.85 kg/m?  ? ?Examination: ? ?General exam: Appears calm and comfortable ?Respiratory system: Respiratory effort normal. ?Cardiovascular system: S1 & S2 heard, RRR. No murmurs, rubs, gallops or clicks. ?Gastrointestinal system: Abdomen is nondistended, soft and nontender.  Normal bowel sounds heard. ?Central nervous system: Alert and oriented. No focal neurological deficits. ?Musculoskeletal: No calf tenderness ?Skin: No  cyanosis. No rashes ?Psychiatry: Judgement and insight appear normal. Mood & affect appropriate.  ? ? ?Data Reviewed: I have personally reviewed following labs and imaging studies ? ?CBC ?Lab  Results  ?Component Value Date  ? WBC 5.3 05/25/2021  ? RBC 2.69 (L) 05/25/2021  ? HGB 8.9 (L) 05/25/2021  ? HCT 26.5 (L) 05/25/2021  ? MCV 98.5 05/25/2021  ? MCH 33.1 05/25/2021  ? PLT 45 (L) 05/25/2021  ? MCHC 33.6 05/25/2021  ? RDW 17.6 (H) 05/25/2021  ? LYMPHSABS 0.4 (L) 05/23/2021  ? MONOABS 0.5 05/23/2021  ? EOSABS 0.1 05/23/2021  ? BASOSABS 0.0 05/23/2021  ? ? ? ?Last metabolic panel ?Lab Results  ?Component Value Date  ? NA 134 (L) 05/24/2021  ? K 3.9 05/24/2021  ? CL 105 05/24/2021  ? CO2 24 05/24/2021  ? BUN 10 05/24/2021  ? CREATININE 0.64 05/24/2021  ? GLUCOSE 114 (H) 05/24/2021  ? GFRNONAA >60 05/24/2021  ? CALCIUM 8.6 (L) 05/24/2021  ? PHOS 3.3 05/21/2021  ? PROT 6.7 05/21/2021  ? ALBUMIN 3.3 (L) 05/21/2021  ? BILITOT 0.8 05/21/2021  ? ALKPHOS 67 05/21/2021  ? AST 27 05/21/2021  ? ALT 23 05/21/2021  ? ANIONGAP 5 05/24/2021  ? ? ?GFR: ?Estimated Creatinine Clearance: 48.4 mL/min (by C-G formula based on SCr of 0.64 mg/dL). ? ?Recent Results (from the past 240 hour(s))  ?MRSA Next Gen by PCR, Nasal     Status: None  ? Collection Time: 05/21/21  9:02 AM  ? Specimen: Nasal Mucosa; Nasal Swab  ?Result Value Ref Range Status  ? MRSA by PCR Next Gen NOT DETECTED NOT DETECTED Final  ?  Comment: (NOTE) ?The GeneXpert MRSA Assay (FDA approved for NASAL specimens only), ?is one component of a comprehensive MRSA colonization surveillance ?program. It is not intended to diagnose MRSA infection nor to guide ?or monitor treatment for MRSA infections. ?Test performance is not FDA approved in patients less than 2 years ?old. ?Performed at [facility], [address] ?[place], [place] [address] ?  ?  ? ? ?Radiology Studies: ?MR BRAIN W WO CONTRAST ? ?Result Date: 05/24/2021 ?CLINICAL DATA:  Endometrial cancer,  evaluate for intracranial metastatic disease EXAM: MRI HEAD WITHOUT AND WITH CONTRAST TECHNIQUE: Multiplanar, multiecho pulse sequences of the brain and surrounding structures were obtained without and with intravenous contrast. CONTRAST:  38m GADAVIST GADOBUTROL 1 MMOL/ML IV SOLN COMPARISON:  None. FINDINGS: Brain: There is no evidence of acute intracranial hemorrhage, extra-axial fluid collection, or acute infarct. Background parenchymal volume is normal for age. The ventricles are normal in size. Patchy foci of FLAIR signal abnormality in the subcortical and periventricular white matter likely reflects sequela of chronic white matter microangiopathy. There is no suspicious parenchymal signal abnormality. There is no abnormal enhancement. There is no mass lesion. There is no mass effect or midline shift. Vascular: Normal flow voids. Skull and upper cervical spine: Normal marrow signal. Sinuses/Orbits: The paranasal sinuses are clear. The globes and orbits are unremarkable. Other: There is a right mastoid effusion. IMPRESSION: No evidence of intracranial metastatic disease. Electronically Signed   By: PValetta MoleM.D.   On: 05/24/2021 09:43   ? ? ? LOS: 6 days  ? ? ?RCordelia Poche MD ?[facility] ?05/25/2021, 4:48 PM ? ? ?If 7PM-7AM, please contact night-coverage ?[email or web address] ? ?

## 2021-05-25 NOTE — Care Management Important Message (Signed)
Important Message ? ?Patient Details IM Letter placed in the Patients room. ?Name: Kristen Freeman ?MRN: [id number] ?Date of Birth: October 20, 1950 ? ? ?Medicare Important Message Given:  Yes ? ? ? ? ?Kerin Salen ?05/25/2021, 10:10 AM ?

## 2021-05-26 ENCOUNTER — Ambulatory Visit
Admit: 2021-05-26 | Discharge: 2021-05-26 | Disposition: A | Discharge status: Home / Self Care | Payer: Medicare Other | Attending: Radiation Oncology | Admitting: Radiation Oncology

## 2021-05-26 DIAGNOSIS — D62 Acute posthemorrhagic anemia: Secondary | ICD-10-CM | POA: Diagnosis not present

## 2021-05-26 DIAGNOSIS — C541 Malignant neoplasm of endometrium: Secondary | ICD-10-CM | POA: Diagnosis not present

## 2021-05-26 DIAGNOSIS — R042 Hemoptysis: Secondary | ICD-10-CM | POA: Diagnosis not present

## 2021-05-26 DIAGNOSIS — I10 Essential (primary) hypertension: Secondary | ICD-10-CM | POA: Diagnosis not present

## 2021-05-26 LAB — CBC
HCT: 23.1 % — ABNORMAL LOW (ref 36.0–46.0)
Hemoglobin: 8 g/dL — ABNORMAL LOW (ref 12.0–15.0)
MCH: 34.5 pg — ABNORMAL HIGH (ref 26.0–34.0)
MCHC: 34.6 g/dL (ref 30.0–36.0)
MCV: 99.6 fL (ref 80.0–100.0)
Platelets: 41 10*3/uL — ABNORMAL LOW (ref 150–400)
RBC: 2.32 MIL/uL — ABNORMAL LOW (ref 3.87–5.11)
RDW: 17 % — ABNORMAL HIGH (ref 11.5–15.5)
WBC: 4.7 10*3/uL (ref 4.0–10.5)
nRBC: 0 % (ref 0.0–0.2)

## 2021-05-26 LAB — PREPARE RBC (CROSSMATCH)

## 2021-05-26 LAB — PROCALCITONIN: Procalcitonin: <0.1 ng/mL

## 2021-05-26 MED ORDER — SODIUM CHLORIDE 0.9% IV SOLUTION: Freq: Once | INTRAVENOUS | Status: AC

## 2021-05-26 NOTE — Progress Notes (Signed)
? ?PROGRESS NOTE ? ? ? ?Kristen Freeman  ZHY:[id number] DOB: Jul 12, 1950 DOA: 05/19/2021 ?PCP: Allie Dimmer, MD ? ? ?Brief Narrative: ?71 year old female with history of endometrial cancer in 2019 and in remission until Dec '22 when she was found to have recurrence, underwent XRT and chemo completing treatment 04/25/21. She had an episode of hemoptysis 05/17/21. She sought care at ED in [place]. She was found to have thrombocytopenia and did had platelet transfusion. Other Lab - Glucose 115, INR 1.1, Hgb 8.4, WBC 4.4, Plt 59, nl Diff. CTA chest - increased pulmonary mets: multiple large nodules. ? Post-obstructive consolidation with nodule 7.6x3.2x2 cm. Bulky adenopathy.EKG - nl.  She was subsequently transferred to [facility] for further evaluation and management.   ?3.26- transferred to stepdown/icu for hemoptysis recurring- s/p bronchoscopy: showed "1. 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 Blocking entire distal airay into RML and RLL" ?Rad on hemonc and palliative team were consulted 3/27 ? ? ?Assessment and Plan: ? ?Hemoptysis ?Secondary to right bronchus intermedius tumor with associated clot. Recommendation to not pursue IR embolization, but rather consideration of radiation oncology for tumor debulking. Radiation oncology consulted and have initiated plan for radiation treatments x10. Treatment course started on 3/27. ? ?Right bronchus intermedius lesion ?Noted on bronchoscopy. Radiation therapy started. ? ?IVC thrombus ?Noted on CT abdomen/pelvis, confirmed by venous duplex. Complicated by recent hemoptysis. IR consulted for consideration of thrombectomy/IVC filter, but recommended against both at this time with preference not to place a suprarenal IVC filter. Heparin planned, however now deferred secondary to worsening thrombocytopenia ?-Observe with plans to start Heparin IV (without bolus) and eventual transition to long term anticoagulation if patient tolerates without recurrent  hemoptysis and thrombocytopenia improves ? ?Metastatic endometrial cancer ?Lung metastasis ?Lymph node metastasis ?Patient is followed by gynecology/oncology as an outpatient for prior endometrial cancer diagnosis. Hematology/oncology consulted this admission for consideration of palliative chemotherapy starting 4/3 ? ?Thrombocytopenia ?Slightly downward drift. Hematology/oncology recommending goal of >50,000. 1 unit of platelets transfused on 3/26. Platelets of 41,000 on CBC today. ?-Daily CBC ? ?Anemia ?Likely chronic component in setting of metastatic cancer and resultant chronic blood loss anemia. Patient with an associated acute component secondary to hemoptysis and likely bleeding from lung mass. 1 unit of PRBC given on 3/27 and another on 3/29. Hemoglobin drifted down from 8.9 to 8 on CBC today. Hematology/oncology has ordered 1 unit of PRBC for transfusion. ?-Daily CBC ? ?Hypokalemia ?Mild. Resolved with repletion. ? ?Glaucoma ?-Continue latanoprost ? ?Primary hypertension ?Hydrochlorothiazide and metoprolol initially held. Metoprolol restarted. Blood pressure stable. ?-Continue metoprolol ? ? ?DVT prophylaxis: SCDs ?Code Status:   Code Status: Full Code ?Family Communication: Son and daughter at bedside ?Disposition Plan: Discharge home vs SNF pending ability to start anticoagulation, stability of hemoglobin/platelets, specialist recommendations for chemotherapy ? ? ?Consultants:  ?Medical oncology ?PCCM ?Radiation oncology ?Interventional radiology ? ?Procedures:  ?Flexible bronchoscopy (05/21/2021) ? ?Antimicrobials: ?None  ? ? ?Subjective: ?Continued cough. Minimal hemoptysis described as a speck of blood. No frank hemoptysis noted. No hematochezia or melena. ? ?Objective: ?BP 119/73 (BP Location: Right Arm)   Pulse 98   Temp 99 ?F (37.2 ?C) (Oral)   Resp 18   Ht _0  (1.499 m)   Wt 55.8 kg   SpO2 95%   BMI 24.85 kg/m?  ? ?Examination: ? ?General exam: Appears calm and comfortable ?Respiratory  system: Clear to auscultation except for diminished breath sounds in RLL. Respiratory effort normal. ?Cardiovascular system: S1 & S2 heard, RRR. No murmurs,  rubs, gallops or clicks. ?Gastrointestinal system: Abdomen is nondistended ?Central nervous system: Alert and oriented. No focal neurological deficits. ?Psychiatry: Judgement and insight appear normal. Mood & affect appropriate.  ? ? ?Data Reviewed: I have personally reviewed following labs and imaging studies ? ?CBC ?Lab Results  ?Component Value Date  ? WBC 4.7 05/26/2021  ? RBC 2.32 (L) 05/26/2021  ? HGB 8.0 (L) 05/26/2021  ? HCT 23.1 (L) 05/26/2021  ? MCV 99.6 05/26/2021  ? MCH 34.5 (H) 05/26/2021  ? PLT 41 (L) 05/26/2021  ? MCHC 34.6 05/26/2021  ? RDW 17.0 (H) 05/26/2021  ? LYMPHSABS 0.4 (L) 05/23/2021  ? MONOABS 0.5 05/23/2021  ? EOSABS 0.1 05/23/2021  ? BASOSABS 0.0 05/23/2021  ? ? ? ?Last metabolic panel ?Lab Results  ?Component Value Date  ? NA 134 (L) 05/24/2021  ? K 3.9 05/24/2021  ? CL 105 05/24/2021  ? CO2 24 05/24/2021  ? BUN 10 05/24/2021  ? CREATININE 0.64 05/24/2021  ? GLUCOSE 114 (H) 05/24/2021  ? GFRNONAA >60 05/24/2021  ? CALCIUM 8.6 (L) 05/24/2021  ? PHOS 3.3 05/21/2021  ? PROT 6.7 05/21/2021  ? ALBUMIN 3.3 (L) 05/21/2021  ? BILITOT 0.8 05/21/2021  ? ALKPHOS 67 05/21/2021  ? AST 27 05/21/2021  ? ALT 23 05/21/2021  ? ANIONGAP 5 05/24/2021  ? ? ?GFR: ?Estimated Creatinine Clearance: 48.4 mL/min (by C-G formula based on SCr of 0.64 mg/dL). ? ?Recent Results (from the past 240 hour(s))  ?MRSA Next Gen by PCR, Nasal     Status: None  ? Collection Time: 05/21/21  9:02 AM  ? Specimen: Nasal Mucosa; Nasal Swab  ?Result Value Ref Range Status  ? MRSA by PCR Next Gen NOT DETECTED NOT DETECTED Final  ?  Comment: (NOTE) ?The GeneXpert MRSA Assay (FDA approved for NASAL specimens only), ?is one component of a comprehensive MRSA colonization surveillance ?program. It is not intended to diagnose MRSA infection nor to guide ?or monitor treatment for MRSA  infections. ?Test performance is not FDA approved in patients less than 2 years ?old. ?Performed at [facility], [address] ?[place], [place] [address] ?  ?  ? ? ?Radiology Studies: ?No results found. ? ? ? LOS: 7 days  ? ? ?Cordelia Poche, MD ?[facility] ?05/26/2021, 12:22 PM ? ? ?If 7PM-7AM, please contact night-coverage ?[email or web address] ? ?

## 2021-05-26 NOTE — Evaluation (Signed)
Physical Therapy Evaluation ?Patient Details ?Name: Kristen Freeman ?MRN: [id number] ?DOB: 01-Oct-1950 ?Today's Date: 05/26/2021 ? ?History of Present Illness ? Patient is 71 y.o. femalewith history of endometrial cancer in 2019 and in remission until Dec '22 when she was found to have recurrence, underwent XRT and chemo completing treatment 04/25/21.  Pt experienced hemoptysis on 3/22 and found to have right bronchus intermedius tumor with associated clot. ?  ?Clinical Impression ? Kristen Freeman is 71 y.o. female admitted with above HPI and diagnosis. Patient is currently limited by functional impairments below (see PT problem list). Patient lives with he husband and is independent at baseline. Patient evaluated by Physical Therapy with no further acute PT needs identified. All education has been completed and the patient has no further questions. Recommending return home with family assist and OPPT if pt wishes to address general balance/strength See below for any follow-up Physical Therapy or equipment needs. PT is signing off. Thank you for this referral.    ?   ? ?Recommendations for follow up therapy are one component of a multi-disciplinary discharge planning process, led by the attending physician.  Recommendations may be updated based on patient status, additional functional criteria and insurance authorization. ? ?Follow Up Recommendations Outpatient PT (OPPT vs No follow up) ? ?  ?Assistance Recommended at Discharge PRN  ?Patient can return home with the following ? Assistance with cooking/housework;Assist for transportation;Help with stairs or ramp for entrance ? ?  ?Equipment Recommendations None recommended by PT  ?Recommendations for Other Services ?    ?  ?Functional Status Assessment Patient has had a recent decline in their functional status and demonstrates the ability to make significant improvements in function in a reasonable and predictable amount of time.  ? ?  ?Precautions / Restrictions  Precautions ?Precautions: Fall ?Restrictions ?Weight Bearing Restrictions: No  ? ?  ? ?Mobility ? Bed Mobility ?Overal bed mobility: Modified Independent ?  ?  ?  ?  ?  ?  ?General bed mobility comments: pt taking extra time and HOB elevated ?  ? ?Transfers ?Overall transfer level: Needs assistance ?Equipment used: None ?Transfers: Sit to/from Stand ?Sit to Stand: Supervision ?  ?  ?  ?  ?  ?General transfer comment: supervision for safety ?  ? ?Ambulation/Gait ?Ambulation/Gait assistance: Supervision ?Gait Distance (Feet): 300 Feet ?Assistive device: Rolling walker (2 wheels) ?Gait Pattern/deviations: WFL(Within Functional Limits), Step-through pattern, Decreased stride length ?Gait velocity: decr ?  ?  ?General Gait Details: overall steady gait WFL's. no LOB noted throughout, slightly decreased pace, no UE support required. ? ?Stairs ?Stairs: Yes ?Stairs assistance: Supervision ?Stair Management: One rail Right, Step to pattern, Forwards ?Number of Stairs: 5 ?General stair comments: no cues needed, pt using single rail, no LOB noted throughout. ? ?Wheelchair Mobility ?  ? ?Modified Rankin (Stroke Patients Only) ?  ? ?  ? ?Balance Overall balance assessment: Mild deficits observed, not formally tested ?  ?  ?  ?  ?  ?  ?  ?  ?  ?  ?  ?  ?  ?  ?  ?  ?  ?  ?   ? ? ? ?Pertinent Vitals/Pain Pain Assessment ?Pain Assessment: No/denies pain  ? ? ?Home Living Family/patient expects to be discharged to:: Private residence ?Living Arrangements: Spouse/significant other ?Available Help at Discharge: Family ?Type of Home: House ?Home Access: Stairs to enter ?Entrance Stairs-Rails: Can reach both ?Entrance Stairs-Number of Steps: 5 ?Alternate Level Stairs-Number of Steps: 11 ?Home Layout: Multi-level ?  Home Equipment: Standard Walker ?   ?  ?Prior Function Prior Level of Function : Independent/Modified Independent ?  ? Pt plans to return home with sister for ~1 month to complete treatments at cancer center and then return home  with spouse. ?  ?  ?  ?  ?  ?  ?  ? ? ?Hand Dominance  ?   ? ?  ?Extremity/Trunk Assessment  ? Upper Extremity Assessment ?Upper Extremity Assessment: Overall WFL for tasks assessed ?  ? ?Lower Extremity Assessment ?Lower Extremity Assessment: Overall WFL for tasks assessed (5xSit<>Stand: 11.4 seconds without UE use) ?  ? ?Cervical / Trunk Assessment ?Cervical / Trunk Assessment: Normal  ?Communication  ? Communication: No difficulties  ?Cognition Arousal/Alertness: Awake/alert ?Behavior During Therapy: [facility] for tasks assessed/performed ?Overall Cognitive Status: Within Functional Limits for tasks assessed ?  ?  ?  ?  ?  ?  ?  ?  ?  ?  ?  ?  ?  ?  ?  ?  ?  ?  ?  ? ?  ?General Comments   ? ?  ?Exercises    ? ?Assessment/Plan  ?  ?PT Assessment Patient does not need any further PT services  ?PT Problem List Decreased activity tolerance;Decreased balance;Decreased mobility ? ?   ?  ?PT Treatment Interventions Gait training;DME instruction;Stair training;Functional mobility training;Therapeutic activities;Therapeutic exercise;Balance training;Neuromuscular re-education;Patient/family education   ? ?PT Goals (Current goals can be found in the Care Plan section)  ?Acute Rehab PT Goals ?Patient Stated Goal: get home ?PT Goal Formulation: With patient ?Time For Goal Achievement: 06/02/21 ?Potential to Achieve Goals: Good ? ?  ?Frequency  (1x eval) ?  ? ? ?Co-evaluation   ?  ?  ?  ?  ? ? ?  ?AM-PAC PT "6 Clicks" Mobility  ?Outcome Measure Help needed turning from your back to your side while in a flat bed without using bedrails?: None ?Help needed moving from lying on your back to sitting on the side of a flat bed without using bedrails?: None ?Help needed moving to and from a bed to a chair (including a wheelchair)?: None ?Help needed standing up from a chair using your arms (e.g., wheelchair or bedside chair)?: None ?Help needed to walk in hospital room?: A Little ?Help needed climbing 3-5 steps with a railing? : A  Little ?6 Click Score: 22 ? ?  ?End of Session Equipment Utilized During Treatment: Gait belt ?Activity Tolerance: Patient tolerated treatment well ?Patient left: in chair;with call bell/phone within reach;with family/visitor present ?Nurse Communication: Mobility status ?PT Visit Diagnosis: Unsteadiness on feet (R26.81);Muscle weakness (generalized) (M62.81);Difficulty in walking, not elsewhere classified (R26.2) ?  ? ?Time: 1101-1120 ?PT Time Calculation (min) (ACUTE ONLY): 19 min ? ? ?Charges:   PT Evaluation ?$PT Eval Low Complexity: 1 Low ?  ?  ?   ? ? ?Gwynneth Albright PT, DPT ?Acute Rehabilitation Services ?Office [phone] ?Pager [phone]  ? ?Jacques Navy ?05/26/2021, 12:38 PM ? ?

## 2021-05-26 NOTE — Progress Notes (Addendum)
Kristen Freeman   DOB:March 15, 1950   BP#:[id number]   ? 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  According to outside records, additional molecular profile/testing has been sent, results showed no detectable actionable mutations, ER/PR neg, PD-L1 neg.   ?She is made aware she has stage IV metastatic cancer, treatment is strictly palliative ?MRI imaging of the brain is negative for disease ?Palliative radiation to her lungs is in progress ?Due to cardiovascular instability, severe pancytopenia and recent bleeding, she is not stable to start palliative chemo right now ?I have arranged for her to receive first dose of inpatient chemotherapy next Monday on April 3 ?We discussed risks, benefits, side effects of combination of paclitaxel with carboplatin versus pembrolizumab with lenvatinib ?Due to high risk for bleeding and clotting, pembrolizumab with lenvatinib is not a good option right now ?Continue supportive care ?I will order premed dexamethasone for Sunday evening and Monday morning ?  ?Hemoptysis, resolved ?Secondary to lung metastasis ?She is undergoing palliative radiation therapy to her chest ?Continue supportive care ?  ?IVC thrombus ?CT abdomen/pelvis shows a filling defect in the infrarenal IVC and bilateral lower extremity Doppler ultrasound confirmed DVT  ?unable to anticoagulate secondary to recent hemoptysis ?IR as well as vascular surgery have been requested for consideration of thrombectomy versus IVC filter placement, overall, deemed not a candidate for thrombectomy or IVC filter placement ?I reviewed the danger of not treating  her IVC thrombus with the patient and family ?Due to her significant bleeding risk, at this point, we have no choice but to observe ?She has no further hemoptysis for at least 3 days in a row ?My plan is to start anticoagulation therapy today, however, due to worsening thrombocytopenia, this plan is placed on hold for now ?If her platelet count started to trend over 50,000, we will start and initiate anticoagulation therapy with IV heparin without bolus ?  ?Macrocytic anemia ?Anemia due to recent hemoptysis ?Received 1 unit PRBCs on 05/22/2021 and on 05/24/2021 ?Serum iron and B12 level were within normal range ?I have started her on folic acid supplementation ?Hemoglobin has dropped from 8.9 to 8.0 today and will transfuse 1 unit PRBCs today ?  ?Thrombocytopenia ?Likely due to recent chemoradiation therapy in [place] as well as consumptive secondary to recent bleeding  ?Received 1 unit of platelets on 05/21/2021 due to active bleeding ?Monitor closely ?I recommend keeping platelet count at 50,000 or above. ?Due to absence of hemoptysis, I am okay deferring plan for platelet transfusion  ?  ?Goals of care ?I have a long discussion with patient and family in regards to [place] and goals of care ?She would like to receive aggressive care as much as possible ?In the event of cardiorespiratory arrest, she would like to be resuscitated but she does not want to be maintained on life support indefinitely ?Her husband and 2 sons are her dedicated healthcare power of attorney ?  ?Discharge planning ?She is not safe for discharge due to pancytopenia, ongoing radiation, and treated thrombosis and aggressive disease ?The risk of premature discharge is risk of death ?The plan over the next few days will be to continue radiation therapy and attempt starting her on anticoagulation therapy with IV heparin  without bolus ?Transfuse as needed ?She needs inpatient chemotherapy to commence while hospitalized, plan for April 3 ?I am  hopeful, she can be discharged either end of the day next week on Monday or early Tuesday ? ?All questions were answered. The patient knows to call the clinic with any problems, questions or concerns. ?  ?Mikey Bussing, NP ?05/26/2021 12:00 PM ? ?Subjective:  ?No further hemoptysis ?Denies chest pain or shortness of breath ?She is eating well ?No other new complaints today ? ?Objective:  ?Vitals:  ? 05/25/21 2130 05/26/21 0435  ?BP: 125/76 119/73  ?Pulse: (!) 103 98  ?Resp: 18 18  ?Temp: 99.6 ?F (37.6 ?C) 99 ?F (37.2 ?C)  ?SpO2: 98% 95%  ?  ?No intake or output data in the 24 hours ending 05/26/21 1200 ? ? ?GENERAL:alert, no distress and comfortable ?CV: Regular rate and rhythm ?RESP: Lungs clear ?NEURO: alert & oriented x 3 with fluent speech, no focal motor/sensory deficits ?  ?Labs:  ?Recent Labs  ?  05/21/21 ?1140 05/22/21 ?0441 05/23/21 ?0430 05/24/21 ?0500  ?NA  --  140 135 134*  ?K  --  3.6 3.4* 3.9  ?CL  --  110 105 105  ?CO2  --  _0 ?GLUCOSE  --  131* 125* 114*  ?BUN  --  11 7* 10  ?CREATININE  --  0.53 0.61 0.64  ?CALCIUM  --  8.7* 8.4* 8.6*  ?GFRNONAA  --  >60 >60 >60  ?PROT 6.7  --   --   --   ?ALBUMIN 3.3*  --   --   --   ?AST 27  --   --   --   ?ALT 23  --   --   --   ?ALKPHOS 67  --   --   --   ?BILITOT 0.8  --   --   --   ?BILIDIR 0.2  --   --   --   ?IBILI 0.6  --   --   --   ? ? ?Studies:  ?CT Angio Chest Pulmonary Embolism (PE) W or WO Contrast ? ?Result Date: 05/21/2021 ?CLINICAL DATA:  71 year old female with history of abnormal chest x-ray. Endometrial cancer. Hemoptysis. * Tracking Code: BO * EXAM: CT ANGIOGRAPHY CHEST WITH CONTRAST TECHNIQUE: Multidetector CT imaging of the chest was performed using the standard protocol during bolus administration of intravenous contrast. Multiplanar CT image reconstructions and MIPs were obtained to evaluate the vascular anatomy. RADIATION DOSE REDUCTION: This exam was performed according to the departmental dose-optimization program which includes  automated exposure control, adjustment of the mA and/or kV according to patient size and/or use of iterative reconstruction technique. CONTRAST:  98m OMNIPAQUE IOHEXOL 350 MG/ML SOLN COMPARISON:  No priors. FINDINGS: Comment: Today's study is limited by considerable patient respiratory motion. Cardiovascular: There are no central, lobar or segmental sized filling defects within the pulmonary arterial tree to suggest pulmonary embolism. Smaller distal subsegmental sized emboli can not be entirely excluded on the basis of today's examination. Heart size is normal. There is no significant pericardial fluid, thickening or pericardial calcification. There is aortic atherosclerosis, as well as atherosclerosis of the great vessels of the mediastinum and the coronary arteries, including calcified atherosclerotic plaque in the left anterior descending coronary arteries. Left internal jugular single-lumen porta cath with tip terminating in the right atrium. Mediastinum/Nodes: There is extensive lymphadenopathy most evident in the superior mediastinum and left supraclavicular region. The largest left supraclavicular lymph nodes measure 2.2  cm in short axis on axial images 5 and 12 of series 4. The largest superior mediastinal nodal mass measures 5.1 x 2.3 cm (axial image 35 of series 4) anterior to the origin of the great vessels. High right paratracheal lymph node measuring 1.6 cm in short axis. Prominent but nonenlarged bilateral hilar lymph nodes are noted. Middle mediastinal lymphadenopathy measuring up to 2.2 cm in short axis adjacent to the descending thoracic aorta (axial image 96 of series 4). Right retrocrural lymph node (axial image 109 of series 4) measuring 1.9 cm in short axis. Esophagus is unremarkable in appearance. No axillary lymphadenopathy. Lungs/Pleura: Numerous pulmonary nodules and masses are noted throughout the lungs bilaterally. The largest confluent mass or conglomeration of masses is in the right  lower lobe (axial image 90 of series 4) measuring 6.7 x 6.7 cm. Several of these lesions demonstrate surrounding ground-glass attenuation, suggesting perilesional hemorrhage. No pleural effusions. Upper Abdom

## 2021-05-27 ENCOUNTER — Inpatient Hospital Stay (HOSPITAL_COMMUNITY): Payer: Medicare Other

## 2021-05-27 ENCOUNTER — Encounter (HOSPITAL_COMMUNITY): Payer: Self-pay | Admitting: Internal Medicine

## 2021-05-27 DIAGNOSIS — D62 Acute posthemorrhagic anemia: Secondary | ICD-10-CM | POA: Diagnosis not present

## 2021-05-27 DIAGNOSIS — R042 Hemoptysis: Secondary | ICD-10-CM | POA: Diagnosis not present

## 2021-05-27 DIAGNOSIS — I82220 Acute embolism and thrombosis of inferior vena cava: Secondary | ICD-10-CM

## 2021-05-27 DIAGNOSIS — C541 Malignant neoplasm of endometrium: Secondary | ICD-10-CM | POA: Diagnosis not present

## 2021-05-27 LAB — HEMOGLOBIN AND HEMATOCRIT, BLOOD
HCT: 27.3 % — ABNORMAL LOW (ref 36.0–46.0)
Hemoglobin: 9.4 g/dL — ABNORMAL LOW (ref 12.0–15.0)

## 2021-05-27 LAB — CBC
HCT: 27.5 % — ABNORMAL LOW (ref 36.0–46.0)
Hemoglobin: 9.6 g/dL — ABNORMAL LOW (ref 12.0–15.0)
MCH: 33.6 pg (ref 26.0–34.0)
MCHC: 34.9 g/dL (ref 30.0–36.0)
MCV: 96.2 fL (ref 80.0–100.0)
Platelets: 41 10*3/uL — ABNORMAL LOW (ref 150–400)
RBC: 2.86 MIL/uL — ABNORMAL LOW (ref 3.87–5.11)
RDW: 17.4 % — ABNORMAL HIGH (ref 11.5–15.5)
WBC: 4.4 10*3/uL (ref 4.0–10.5)
nRBC: 0 % (ref 0.0–0.2)

## 2021-05-27 IMAGING — CT CT ANGIO CHEST
4 of 8 series · 17 of 46 positions shown · IV contrast (OMNIPAQUE 350)
Comparison: CT angiogram chest [DATE].

CLINICAL DATA: Soft tissue mass in the chest. C targets for
bronchial artery embolization.

EXAM:
CT ANGIOGRAPHY CHEST WITH CONTRAST
TECHNIQUE: Multidetector CT imaging of the chest was performed using the
standard protocol during bolus administration of intravenous
contrast. Multiplanar CT image reconstructions and MIPs were
obtained to evaluate the vascular anatomy.

[Series 4: axial pre · axial · non-contrast · 0.61mm/px · z∈[-410,-280]mm · 3 of 54 slices shown]
[im 14/54  lung]
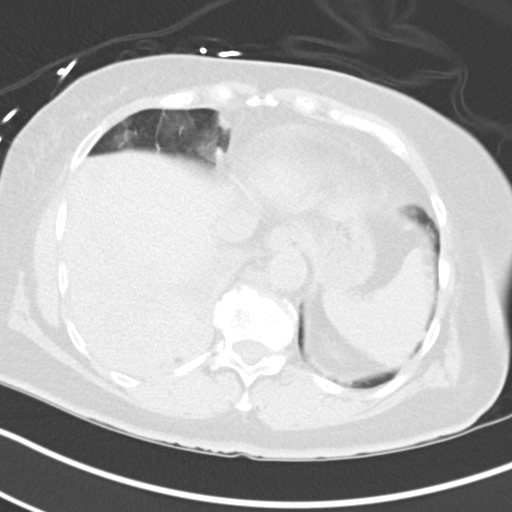
[im 27/54  lung]
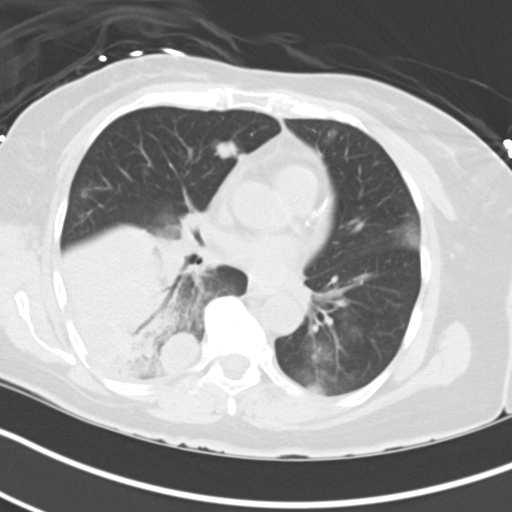
[im 40/54  lung]
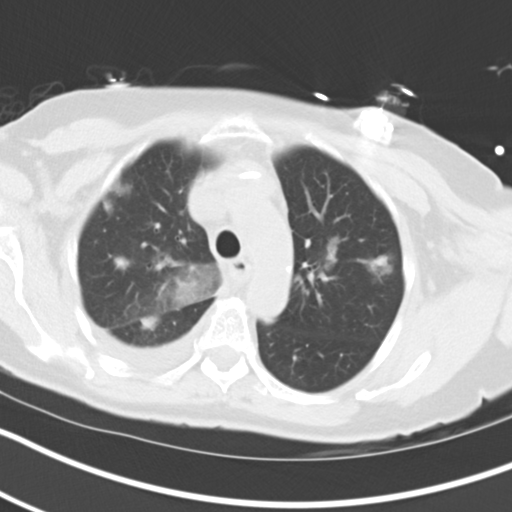

[Series 5: lung pre · axial · non-contrast · 0.52mm/px · z∈[-424,-318]mm · 5 of 121 slices shown]
[im 14/121  soft-tissue]
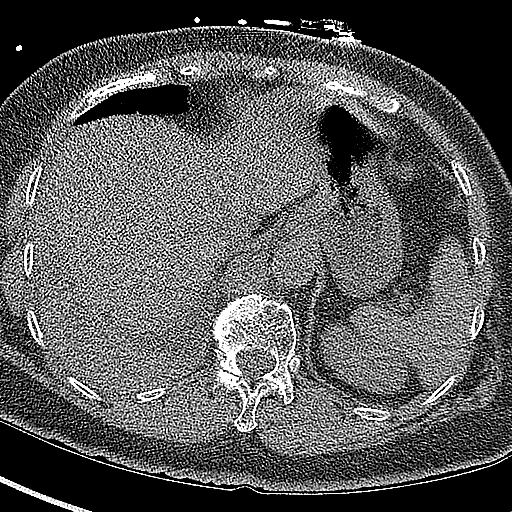
[im 27/121  soft-tissue]
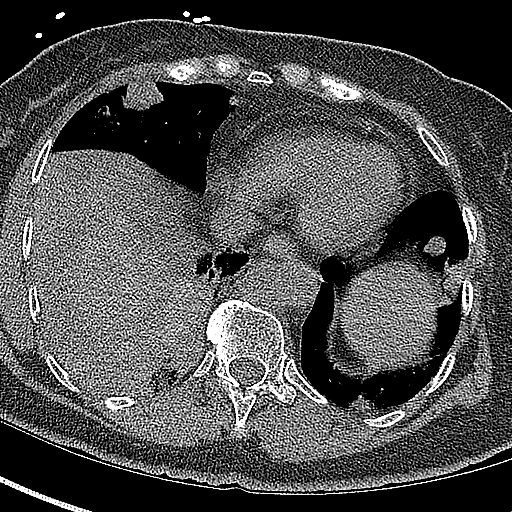
[im 41/121  soft-tissue]
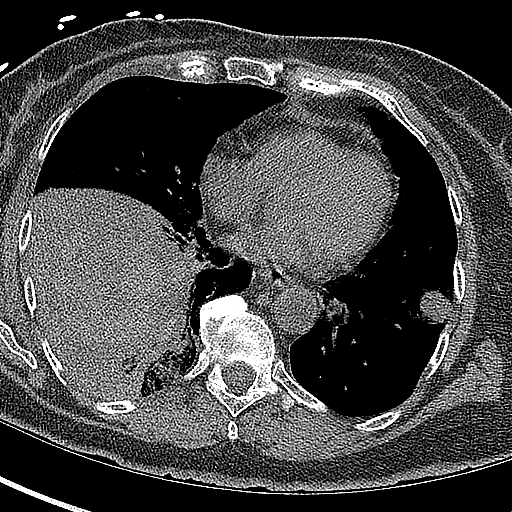
[im 54/121  soft-tissue]
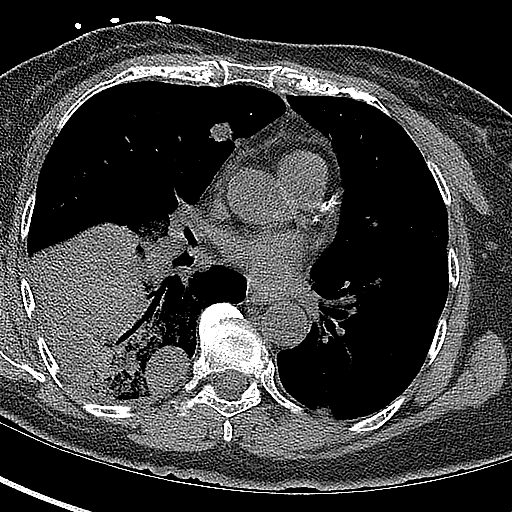
[im 67/121  soft-tissue]
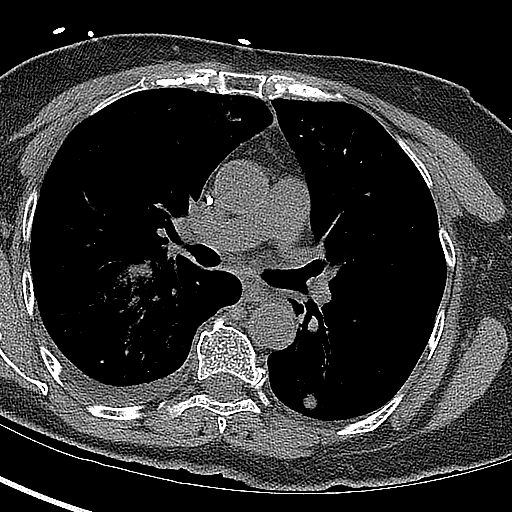

[Series 6: axial arterial · axial · arterial · 0.71mm/px · z∈[-444,-250]mm · 6 of 91 slices shown]
[im 13/91  lung]
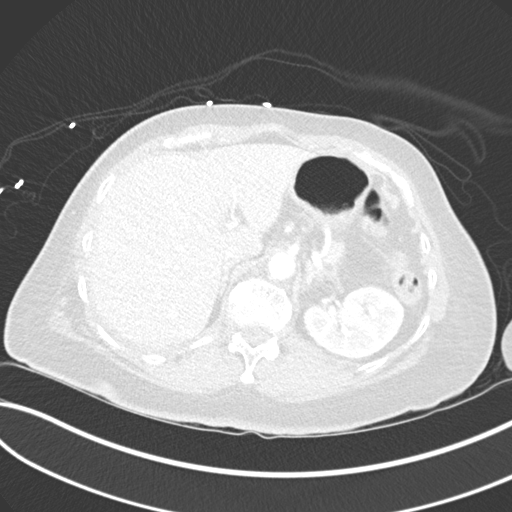
[im 26/91  soft-tissue]
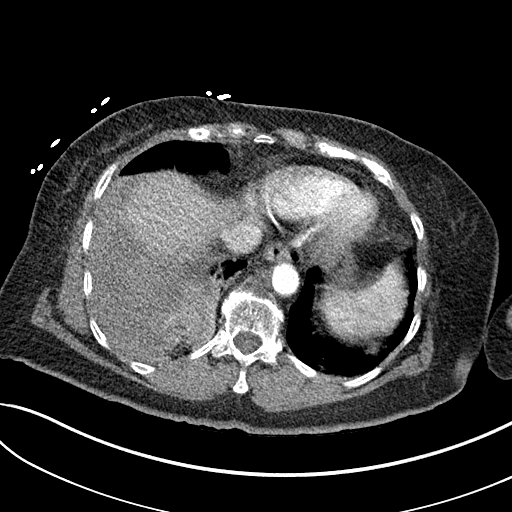
[im 39/91  lung]
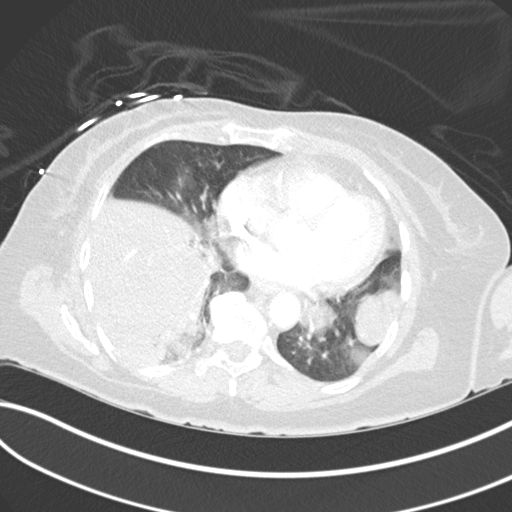
[im 52/91  soft-tissue]
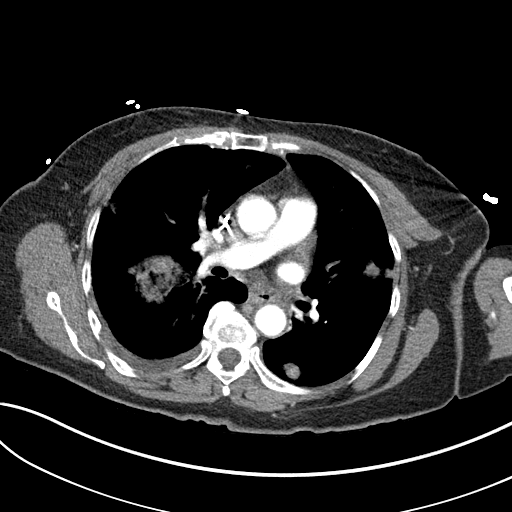
[im 65/91  lung]
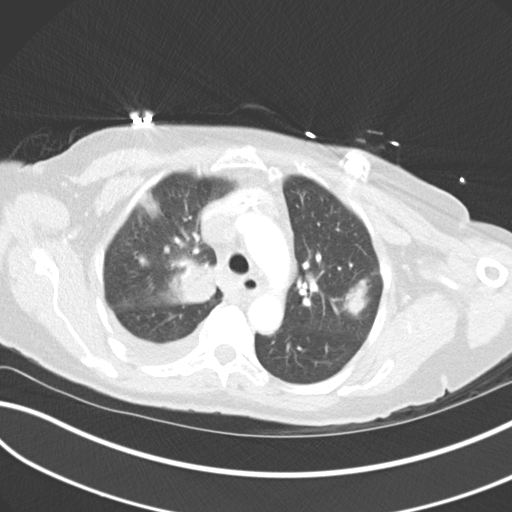
[im 78/91  soft-tissue]
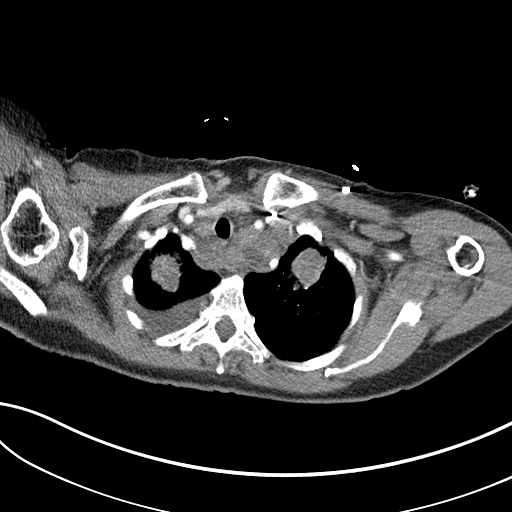

[Series 7: coronals · coronal · 0.54mm/px · 3 of 125 slices shown]
[im 32/125  soft-tissue]
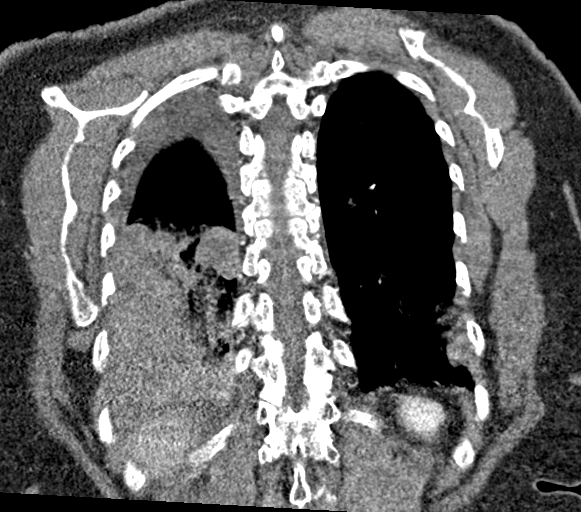
[im 63/125  soft-tissue]
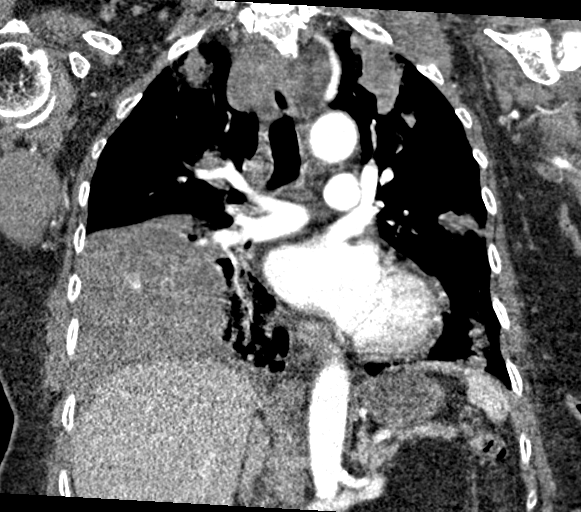
[im 94/125  soft-tissue]
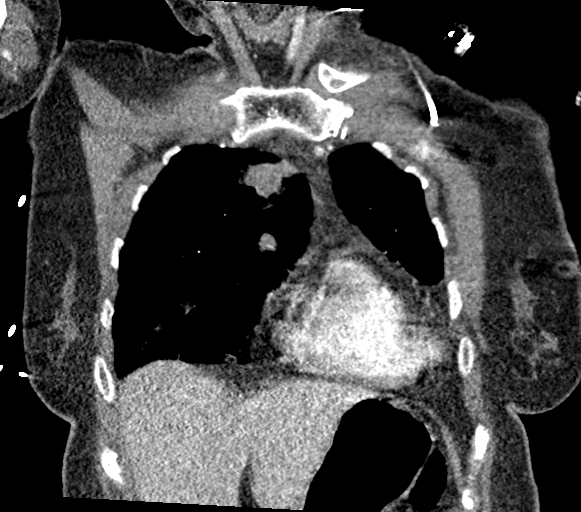

[17 of 46 positions shown; findings below may reference images not displayed]

RADIATION DOSE REDUCTION: This exam was performed according to the
departmental dose-optimization program which includes automated
exposure control, adjustment of the mA and/or kV according to
patient size and/or use of iterative reconstruction technique.

CONTRAST:  80mL OMNIPAQUE IOHEXOL 350 MG/ML SOLN
FINDINGS: Cardiovascular: Satisfactory opacification of the pulmonary arteries
to the segmental level. No evidence of pulmonary embolism. Normal
heart size. No pericardial effusion. Left chest port catheter tip
ends in the distal SVC.

Mediastinum/Nodes: Diffuse mediastinal adenopathy is again noted.
There are enlarged prevascular lymph nodes measuring up to 3.4 x
cm image [DATE], slightly increased in size from prior study. Enlarged
lymph node anterior to the mid trachea measures 2 cm short axis and
has slightly increased in size image [DATE]. Enlarged periaortic lymph
nodes seen in the paraesophageal region measure up to 2 cm and has
not significantly changed in size.

Esophagus is nondilated. Visualized thyroid gland within normal
limits.

Lungs/Pleura: Numerous bilateral pulmonary nodules are again seen,
majority have mildly increased in size in the interval for example
left lower lobe image 2/85 measuring 4.5 x 2.7 cm (previously 2.5 x
3.5 cm), left upper lobe image 2/49 measuring 2.0 x 2.7 cm
(previously 1.9 x 1.2 cm), and left upper lobe measuring 2.3 x
cm image [DATE] (previously 2.6 x 1.6 cm). Many of these regions have
surrounding ground-glass opacity similar to the prior examination.

Again seen is a right lower lobe mass with surrounding atelectasis.
Margins of the mass are difficult to measure but are proximally
x 7.9 cm, likely slightly increased in size. On postcontrast imaging
there are small blushes of contrast within the central mass image
6/53. There are new ground-glass opacities the superior segment of
the left lower lobe adjacent to the mass.

There is a new layering small right pleural effusion.

Upper Abdomen: No acute abnormality.

Musculoskeletal: Left supraclavicular lymphadenopathy measures up to
2.6 cm short axis image [DATE] (previously 2.1 cm). No acute fracture
or focal osseous lesion.

Review of the MIP images confirms the above findings.
IMPRESSION: 1. Right lower lobe mass has mildly increased in size. There is
there are areas of enhancement within the central mass which may
indicate active bleeding. Surrounding right lower lobe atelectasis
again noted.
2. Bilateral pulmonary nodules have increased in size worrisome for
worsening metastatic disease. Many of these have surrounding
ground-glass opacities worrisome for hemorrhage, similar to prior.
3. Upper mediastinal and left supraclavicular lymphadenopathy has
increased in size.
4. New small right pleural effusion.
5. No evidence for pulmonary embolism.

## 2021-05-27 MED ORDER — TRANEXAMIC ACID FOR INHALATION
500.0000 mg | Freq: Three times a day (TID) | RESPIRATORY_TRACT | Status: AC
  Administered 2021-05-27 – 2021-05-29 (×6): 500 mg via RESPIRATORY_TRACT
  Filled 2021-05-27 (×7): qty 10

## 2021-05-27 MED ORDER — TRANEXAMIC ACID FOR INHALATION
500.0000 mg | Freq: Once | RESPIRATORY_TRACT | Status: AC
  Administered 2021-05-27: 500 mg via RESPIRATORY_TRACT
  Filled 2021-05-27: qty 10

## 2021-05-27 MED ORDER — SODIUM CHLORIDE 0.9% IV SOLUTION: Freq: Once | INTRAVENOUS | Status: AC

## 2021-05-27 MED ORDER — HYDROCOD POLI-CHLORPHE POLI ER 10-8 MG/5ML PO SUER
5.0000 mL | Freq: Four times a day (QID) | ORAL | Status: DC
  Administered 2021-05-27 – 2021-06-02 (×24): 5 mL via ORAL
  Filled 2021-05-27 (×24): qty 5

## 2021-05-27 MED ORDER — GUAIFENESIN-DM 100-10 MG/5ML PO SYRP
5.0000 mL | ORAL_SOLUTION | ORAL | Status: DC | PRN
  Administered 2021-05-27 (×3): 5 mL via ORAL
  Filled 2021-05-27 (×3): qty 10

## 2021-05-27 MED ORDER — IOHEXOL 350 MG/ML SOLN
100.0000 mL | Freq: Once | INTRAVENOUS | Status: AC | PRN
  Administered 2021-05-27: 80 mL via INTRAVENOUS

## 2021-05-27 MED ORDER — HYDROCODONE BIT-HOMATROP MBR 5-1.5 MG/5ML PO SOLN
5.0000 mL | Freq: Four times a day (QID) | ORAL | Status: DC
  Administered 2021-05-27: 5 mL via ORAL
  Filled 2021-05-27: qty 5

## 2021-05-27 MED ORDER — HYDROCOD POLI-CHLORPHE POLI ER 10-8 MG/5ML PO SUER
5.0000 mL | Freq: Four times a day (QID) | ORAL | Status: DC
Start: 2021-05-27 — End: 2021-05-27

## 2021-05-27 NOTE — Progress Notes (Signed)
Pt coughed up blood clot about the size of a quarter. Continuing to cough up frank blood with coughing spells. Coughing medications given. MD notified, to assess at bedside. Yellow MEWS protocol began r/t tachycardia. MD assessed at bedside. Orders placed for pt transfer. Son at bedside, updated on plan of care as well.  ? 05/27/21 0824  ?Assess: MEWS Score  ?Temp 99.4 ?F (37.4 ?C)  ?BP (!) 155/89  ?Pulse Rate (!) 111  ?Level of Consciousness Alert  ?SpO2 98 %  ?O2 Device Room Air  ?Assess: MEWS Score  ?MEWS Temp 0  ?MEWS Systolic 0  ?MEWS Pulse 2  ?MEWS RR 0  ?MEWS LOC 0  ?MEWS Score 2  ?MEWS Score Color Yellow  ?Assess: if the MEWS score is Yellow or Red  ?Were vital signs taken at a resting state? Yes  ?Focused Assessment Change from prior assessment (see assessment flowsheet)  ?Does the patient meet 2 or more of the SIRS criteria? No  ?MEWS guidelines implemented *See Row Information* Yes  ?Treat  ?MEWS Interventions Escalated (See documentation below);Administered prn meds/treatments  ?Pain Scale 0-10  ?Pain Score 0  ?Take Vital Signs  ?Increase Vital Sign Frequency  Yellow: Q 2hr X 2 then Q 4hr X 2, if remains yellow, continue Q 4hrs  ?Escalate  ?MEWS: Escalate Yellow: discuss with charge nurse/RN and consider discussing with provider and RRT  ?Notify: Charge Nurse/RN  ?Name of Charge Nurse/RN Notified Dowell RN  ?Date Charge Nurse/RN Notified 05/27/21  ?Time Charge Nurse/RN Notified 0825  ?Notify: Provider  ?Provider Name/Title Cordelia Poche MD  ?Date Provider Notified 05/27/21  ?Time Provider Notified [phone]  ?Notification Type Page  ?Notification Reason Change in status  ?Provider response At bedside  ?Date of Provider Response 05/27/21  ?Time of Provider Response 0830  ?Document  ?Patient Outcome Transferred/level of care increased  ?Progress note created (see row info) Yes  ?Assess: SIRS CRITERIA  ?SIRS Temperature  0  ?SIRS Pulse 1  ?SIRS Respirations  0  ?SIRS WBC 0  ?SIRS Score Sum  1  ? ?MEWS  Guidelines - (patients age 73 and over) ? ? ?

## 2021-05-27 NOTE — Progress Notes (Signed)
Pt transferred to ICU per MD order. Family with pt at time of transfer. All belongings sent with pt at time of transfer.  ?

## 2021-05-27 NOTE — Progress Notes (Signed)
? ?NAME:  Kristen Freeman, MRN:  [id number], DOB:  [birth year], LOS: 8 ?ADMISSION DATE:  05/19/2021, CONSULTATION DATE:  05/20/2021 ?REFERRING MD: Antonieta Pert - TRH CHIEF COMPLAINT:  Hemoptysis  ? ?History of Present Illness:  ?71 year old female who presented to [facility] 3/24 for hemoptysis. PMHx significant for HTN, HLD, glaucoma and endometrial CA (diagnosed 2019, s/p radiation and chemo). She has been under the care of Dr. Beatrix Fetters at [facility]; received chemo and was in remission until 01/2021 when she had recurring abdominal lymphadenopathy. ? ?Patient was doing well until 3/22 when she noticed a small amount of hemoptysis early in the morning. Recurrent hemoptysis was noted 3/23 and 3/24. She presented to the emergency department in [place], [place]. CT Chest demonstrated a mass, prompting referral to [facility] for treatment/management.   ? 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 There is one mass 7.6cm with bulky adenopathy.  CXR shows bilateral lower lobe density right greater than left. ? ?Bronchoscopy showed endobronchial mass and bronchus intermedius with overlying clot  ? ?Pertinent Past Medical History:  ?Endometrial CA, glaucoma, HLD, HTN ? ?Significant Hospital Events:  ?05/19/2021 - Admit ?3/26 Bronchoscopy per Dr. Chase Caller >>  Rt bronchus intermedius lesion - clot with tumor. Blocking entire distal airay into RML and RLL, IR deferred embolization  ?3/27 Urgent Referral to radiation oncology to control bleeding. Hgb drop from 7.6 to 6.9 overnight. ?3/28 CT A/P demonstrating filling defect in infrarenal IVC c/w VTE and bulky adenopathy in retroperitoneum; RLL consolidation mass with multiple metastatic pulmonary nodules. IR consulted for IVC filter placement. XRT. ?3/29 MRI brain negative for malignancy ? ?Interim History / Subjective:  ? ?PCCM reconsulted for worsening hemoptysis ?She is coughing up blood clots and some frank blood ? ?Objective:  ?Blood pressure (!) 155/89, pulse (!) 111,  temperature 98.6 ?F (37 ?C), temperature source Oral, resp. rate (!) 22, height '4\' 11"'$  (1.499 m), weight 55.8 kg, SpO2 98 %. ?   ?   ? ?Intake/Output Summary (Last 24 hours) at 05/27/2021 1409 ?Last data filed at 05/26/2021 1945 ?Gross per 24 hour  ?Intake 826 ml  ?Output --  ?Net 826 ml  ? ? ?Filed Weights  ? 05/22/21 0800 05/24/21 2229  ?Weight: 55.9 kg 55.8 kg  ? ?Physical Examination: ?General: elderly appearing female in mild distress due to hemoptysis ?HEENT: [place]/AT, PERRL, no JVD ?Neuro: Alert, oriented, non-focal ?CV: S1-S2 regular ?PULM: No accessory muscle use, clear breath sounds bilateral, no rhonchi ?GI: Soft, NT, ND ?Extremities: no acute deformity ?Skin: grossly intact  ? ? ?Labs show platelets of 41, stable anemia ? ?Resolved Hospital Problem List:  ? ? ?Assessment & Plan:  ? ?Hemoptysis since 05/17/2021:  Present on admission. Due to pulmonary metastasis from endometrial cancer. Bronchoscopy 3/26 with large lesion at bronchus intermedius blocking airway to RML and RLL. IR embolization was not an option.  Worse on 4/1 ?- Continue Rad Onc treatment (targeted for hemostasis) ?-  TXA neb every 6 hours for 6 doses ?-Options limited here , discussed with IR will obtain CT angiogram chest aorta to look for targets of bronchial artery embolization ?-Otherwise if worsening would need intubation and can consider debulking with cryo ?-Tussionex every 6 hours to abolish cough ? 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  ?- not a candidate for IVC filter due to location of IVC thrombus ?- Not currently a candidate for anticoagulation due to hemoptysis ?-We will have to hold anticoagulation ? ?Endometrial cancer: rapidly progressing now including metastasis  to at least the lungs and now likely intraabdominal mets. MRI brain negative. ?Radiation oncology and medical oncology following, s/p simulation 3/27. ?- XRT as above ?-Oncology following, not stable to start palliative chemo ?-Palliative  radiation in progress ? ?Thrombocytopenia ?- Trend CBC (s/p 1U PRBCs 3/27, 1U Plt 3/26) ?- Transfuse for Hgb < 7, Plt < 50 or hemodynamically significant bleeding ? ?[place] ?- Remains full code ?-Son, daughter and husband updated at bedside ? ?Best practice (daily eval):  ?Per Primary Team ? ?Signature:  ? ? ?Kara Mead MD. Shade Flood. ?[facility] ?Pager : [phone] ? ?If no response to pager , please call [phone] until 7 pm ?After 7:00 pm call Elink  [phone]  ? ? ? ?05/27/2021 2:09 PM ? ? ? ? ?

## 2021-05-27 NOTE — TOC Progression Note (Addendum)
Transition of Care (TOC) - Progression Note  ? ? ?Patient Details  ?Name: Kristen Freeman ?MRN: [id number] ?Date of Birth: 1950/09/23 ? ?Transition of Care (TOC) CM/SW Contact  ?Tawanna Cooler, RN ?Phone Number: ?05/27/2021, 2:06 PM ? ?Clinical Narrative:    ? ?Patient transferred to stepdown.  TOC CM continuing to follow for eventual discharge needs.  ? ?  ?  ?

## 2021-05-27 NOTE — Progress Notes (Addendum)
? ?Chief Complaint: ?Patient was seen in consultation today for hemoptysis  ? ?Referring Physician(s): Dr. Lonny Prude ? ?Supervising Physician: Markus Daft ? ?Patient Status: [facility] - In-pt ? ?History of Present Illness: ?Kristen Freeman is a 71 y.o. female with a medical history significant for endometrial cancer diagnosed 2019. She is from [place], [place] and received her chemotherapy there. She achieved remission and had been doing well until December 2022 when imaging revealed lymphadenopathy in the abdomen. She underwent chemotherapy/radiation therapy and tolerated those treatments well.  ? ?She presented to the ED in [facility] 05/19/21 for evaluation of hemoptysis that had started a few days prior. Work up in the ED showed a lung mass and she was transferred to [facility] 05/20/21 for further work up. CT Angio Chest 05/21/21 showed widespread metastatic disease to the chest including numerous large pulmonary nodules and masses.  ? ?Pulmonary/Critical care performed a flexible video bronchoscopy 05/21/21 and found a right bronchus intermedius lesion - clot with tumor blocking the entire distal airway into the RML and RLL. Radiation Oncology was consulted for radiation to control the bleeding. Interventional Radiology was also consulted for possible embolization. Imaging was reviewed by Dr. Dwaine Gale and it was determined that there was no role for IR embolization.  ? ?A CT Abdomen/pelvis 05/22/21 demonstrated a long tubular filling defect within the infrarenal IVC consistent with venous thrombus placing the patient at risk for PE. A lobar consolidation mass in the right lower lobe was also identified. A DVT in the right popliteal vein was identified in venous duplex and Interventional Radiology was consulted for IVC filter placement. Dr. Anselm Pancoast reviewed the imaging and due to the configuration and morphology of the IVC thrombus as well as nearby bulky retroperitoneal disease there was concern for tumor thrombus. It  was determined that an IVC filter could not be placed based on the location of the IVC thrombus and also because the suprarenal IVC is borderline sized for filter placement at nearly 30 mm.  ? ?The patient has demonstrated worsening hemoptysis and Interventional Radiology has been asked to re-evaluate this patient for an image-guided bronchial embolization.  ? ?Past Medical History:  ?Diagnosis Date  ? Endometrial cancer ([facility])   ? dx 2019 tx'd; recurrence 12/22 - XRT x 30, Chem 03/21/21 to 04/25/21  ? Glaucoma   ? HLD (hyperlipidemia)   ? on statin  ? HTN (hypertension)   ? ? ?Past Surgical History:  ?Procedure Laterality Date  ? TM repair N/A   ? TONSILLECTOMY    ? TOTAL ABDOMINAL HYSTERECTOMY W/ BILATERAL SALPINGOOPHORECTOMY    ? TOTAL KNEE ARTHROPLASTY Left   ? ? ?Allergies: ?Latex and Tape ? ?Medications: ?Prior to Admission medications   ?Medication Sig Start Date End Date Taking? Authorizing Provider  ?acetaminophen (TYLENOL) 500 MG tablet Take 500 mg by mouth every 6 (six) hours as needed for mild pain or headache.   Yes [provider]  ?atorvastatin (LIPITOR) 10 MG tablet Take 10 mg by mouth daily. 04/07/21  Yes [provider]  ?Calcium Carb-Cholecalciferol (CALCIUM + D3 PO) Take 1 tablet by mouth daily with breakfast.   Yes [provider]  ?famotidine (PEPCID) 20 MG tablet Take 20 mg by mouth daily as needed for heartburn or indigestion.   Yes [provider]  ?Glycerin-Polysorbate 80 (REFRESH DRY EYE THERAPY OP) Place 1 drop into both eyes 2 (two) times daily as needed (for dryness).   Yes [provider]  ?hydrochlorothiazide (MICROZIDE) 12.5 MG capsule Take 12.5 mg  by mouth daily. 05/19/21  Yes [provider]  ?HYDROcodone-acetaminophen (NORCO/VICODIN) 5-325 MG tablet Take 1 tablet by mouth every 6 (six) hours as needed for pain. 03/19/21  Yes [provider]  ?latanoprost (XALATAN) 0.005 % ophthalmic solution Place 1 drop into both eyes at  bedtime. 03/26/21  Yes [provider]  ?metoprolol tartrate (LOPRESSOR) 50 MG tablet Take 50 mg by mouth 2 (two) times daily. 05/01/21  Yes [provider]  ?MIRALAX 17 GM/SCOOP powder Take 17 g by mouth See admin instructions. Mix 17 grams of powder into the recommended amount of water and drink 1-2 times a week   Yes [provider]  ?Multiple Vitamins-Minerals (ONE-A-DAY PROACTIVE 65+) TABS Take 1 tablet by mouth daily with breakfast.   Yes [provider]  ?ondansetron (ZOFRAN) 4 MG tablet Take 4 mg by mouth 3 (three) times daily as needed for nausea/vomiting. 03/23/21  Yes [provider]  ?Potassium Chloride ER 20 MEQ TBCR Take 40 mEq by mouth daily. 04/06/21  Yes [provider]  ?prochlorperazine (COMPAZINE) 10 MG tablet Take 10 mg by mouth 4 (four) times daily as needed for nausea/vomiting. 03/21/21  Yes [provider]  ?triamcinolone cream (KENALOG) 0.1 % Apply 1 application. topically 2 (two) times daily. ?Patient not taking: Reported on 05/20/2021 02/22/21   [provider]  ?  ? ?Family History  ?Problem Relation Age of Onset  ? Alzheimer's disease Mother   ? Diabetes Father   ? Prostate cancer Father   ? Hypertension Sister   ? Diabetes Sister   ? Breast cancer Sister   ? Hypertension Sister   ? Diabetes Sister   ? CVA Brother   ? Alcoholism Brother   ? ? ?Social History  ? ?Socioeconomic History  ? Marital status: Married  ?  Spouse name: Not on file  ? Number of children: 3  ? Years of education: 36  ? Highest education level: Not on file  ?Occupational History  ? 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 Smoking status: Never  ? Smokeless tobacco: Never  ?Substance and Sexual Activity  ? Alcohol use: Not Currently  ? Drug use: Never  ? Sexual activity: Not on file  ?Other Topics Concern  ? Not on file  ?Social History Narrative  ? Not on file  ? ?Social Determinants of Health  ? ?Financial Resource Strain: Not on  file  ?Food Insecurity: Not on file  ?Transportation Needs: Not on file  ?Physical Activity: Not on file  ?Stress: Not on file  ?Social Connections: Not on file  ? ? ?Review of Systems: A 12 point ROS discussed and pertinent positives are indicated in the HPI above.  All other systems are negative. ? ?Review of Systems  ?Respiratory:  Positive for cough.   ?     Hemoptysis   ? ?Vital Signs: ?BP (!) 155/89 (BP Location: Right Arm)   Pulse (!) 111   Temp 98.6 ?F (37 ?C) (Oral)   Resp (!) 22   Ht '4\' 11"'$  (1.499 m)   Wt 123 lb 0.3 oz (55.8 kg)   SpO2 98%   BMI 24.85 kg/m?  ? ?Physical Exam ?Constitutional:   ?   General: She is not in acute distress. ?Pulmonary:  ?   Effort: Pulmonary effort is normal.  ?Neurological:  ?   Mental Status: She is alert and oriented to person, place, and time.  ? ? ?Imaging: ?CT Angio Chest Pulmonary  Embolism (PE) W or WO Contrast ? ?Result Date: 05/21/2021 ?CLINICAL DATA:  71 year old female with history of abnormal chest x-ray. Endometrial cancer. Hemoptysis. * Tracking Code: BO * EXAM: CT ANGIOGRAPHY CHEST WITH CONTRAST TECHNIQUE: Multidetector CT imaging of the chest was performed using the standard protocol during bolus administration of intravenous contrast. Multiplanar CT image reconstructions and MIPs were obtained to evaluate the vascular anatomy. RADIATION DOSE REDUCTION: This exam was performed according to the departmental dose-optimization program which includes automated exposure control, adjustment of the mA and/or kV according to patient size and/or use of iterative reconstruction technique. CONTRAST:  28m OMNIPAQUE IOHEXOL 350 MG/ML SOLN COMPARISON:  No priors. FINDINGS: Comment: Today's study is limited by considerable patient respiratory motion. Cardiovascular: There are no central, lobar or segmental sized filling defects within the pulmonary arterial tree to suggest pulmonary embolism. Smaller distal subsegmental sized emboli can not be entirely excluded on the  basis of today's examination. Heart size is normal. There is no significant pericardial fluid, thickening or pericardial calcification. There is aortic atherosclerosis, as well as atherosclerosis of the great v

## 2021-05-27 NOTE — Progress Notes (Addendum)
? ?PROGRESS NOTE ? ? ? ?Kristen Freeman  VZD:[id number] DOB: 02/21/1951 DOA: 05/19/2021 ?PCP: Allie Dimmer, MD ? ? ?Brief Narrative: ?71 year old female with history of endometrial cancer in 2019 and in remission until Dec '22 when she was found to have recurrence, underwent XRT and chemo completing treatment 04/25/21. She had an episode of hemoptysis 05/17/21. She sought care at ED in [place]. She was found to have thrombocytopenia and did had platelet transfusion. Other Lab - Glucose 115, INR 1.1, Hgb 8.4, WBC 4.4, Plt 59, nl Diff. CTA chest - increased pulmonary mets: multiple large nodules. ? Post-obstructive consolidation with nodule 7.6x3.2x2 cm. Bulky adenopathy.EKG - nl.  She was subsequently transferred to [facility] for further evaluation and management.   ?3.26- transferred to stepdown/icu for hemoptysis recurring- s/p bronchoscopy: showed "1. 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 Blocking entire distal airay into RML and RLL" ?Rad on hemonc and palliative team were consulted 3/27 ? ? ?Assessment and Plan: ? ?Hemoptysis ?Secondary to right bronchus intermedius tumor with associated clot. Recommendation to not pursue IR embolization, but rather consideration of radiation oncology for tumor debulking. Radiation oncology consulted and have initiated plan for radiation treatments x10. Treatment course started on 3/27. Patient developed recurrent hemoptysis on 4/1. ?-Hycodan QID ?-IR consult for consideration of embolization ?-Pulmonology consult. Recommendation to consider tranexamic acid nebulizer ?-Tranexamic acid nebulizer x1 ?-Platelet transfusion and repeat CBC ? ?Right bronchus intermedius lesion ?Noted on bronchoscopy. Radiation therapy started. ? ?IVC thrombus ?Noted on CT abdomen/pelvis, confirmed by venous duplex. Complicated by recent hemoptysis. IR consulted for consideration of thrombectomy/IVC filter, but recommended against both at this time with preference not to place  a suprarenal IVC filter. Heparin planned, however now deferred secondary to worsening thrombocytopenia ?-Observe with plans to start Heparin IV (without bolus) and eventual transition to long term anticoagulation if patient tolerates without recurrent hemoptysis and thrombocytopenia improves ? ?Metastatic endometrial cancer ?Lung metastasis ?Lymph node metastasis ?Patient is followed by gynecology/oncology as an outpatient for prior endometrial cancer diagnosis. Hematology/oncology consulted this admission for consideration of palliative chemotherapy starting 4/3 ? ?Thrombocytopenia ?Slightly downward drift. Hematology/oncology recommending goal of >50,000. 1 unit of platelets transfused on 3/26. Platelets of 41,000 on CBC today. ?-Daily CBC ?-Platelet transfusion as mentioned above ? ?Anemia ?Likely chronic component in setting of metastatic cancer and resultant chronic blood loss anemia. Patient with an associated acute component secondary to hemoptysis and likely bleeding from lung mass. Patient has received 3 units of PRBC to date. Currently with ongoing hemoptysis ?-Daily CBC ? ?Hypokalemia ?Mild. Resolved with repletion. ? ?Glaucoma ?-Continue latanoprost ? ?Primary hypertension ?Hydrochlorothiazide and metoprolol initially held. Metoprolol restarted. Blood pressure stable. ?-Continue metoprolol ? ? ?DVT prophylaxis: SCDs ?Code Status:   Code Status: Full Code ?Family Communication: Son at bedside and son on telephone ?Disposition Plan: Transfer to stepdown unit. Eventually, discharge home vs SNF pending ability to start anticoagulation, stability of hemoglobin/platelets, specialist recommendations for chemotherapy ? ? ?Consultants:  ?Medical oncology ?PCCM ?Radiation oncology ?Interventional radiology ? ?Procedures:  ?Flexible bronchoscopy (05/21/2021) ? ?Antimicrobials: ?None  ? ? ?Subjective: ?Patient developed recurrent hemoptysis this morning. Initially she coughed up a large clot, flowed by hemoptysis of  fresh blood. No dyspnea or chest pain. Persistent coughing. ? ?Objective: ?BP (!) 155/89 (BP Location: Right Arm)   Pulse (!) 111   Temp 99.4 ?F (37.4 ?C) (Oral)   Resp 16   Ht _0  (1.499 m)   Wt 55.8 kg   SpO2 98%   BMI  24.85 kg/m?  ? ?Examination: ? ?General exam: Appears calm and comfortable ?Respiratory system: Clear to auscultation. Respiratory effort normal. ?Cardiovascular system: S1 & S2 heard, RRR. No murmurs, rubs, gallops or clicks. ?Gastrointestinal system: Abdomen is non-distended ?Central nervous system: Alert and oriented. No focal neurological deficits. ?Psychiatry: Judgement and insight appear normal. Mood & affect appropriate.  ? ? ?Data Reviewed: I have personally reviewed following labs and imaging studies ? ?CBC ?Lab Results  ?Component Value Date  ? WBC 4.4 05/27/2021  ? RBC 2.86 (L) 05/27/2021  ? HGB 9.6 (L) 05/27/2021  ? HCT 27.5 (L) 05/27/2021  ? MCV 96.2 05/27/2021  ? MCH 33.6 05/27/2021  ? PLT 41 (L) 05/27/2021  ? MCHC 34.9 05/27/2021  ? RDW 17.4 (H) 05/27/2021  ? LYMPHSABS 0.4 (L) 05/23/2021  ? MONOABS 0.5 05/23/2021  ? EOSABS 0.1 05/23/2021  ? BASOSABS 0.0 05/23/2021  ? ? ? ?Last metabolic panel ?Lab Results  ?Component Value Date  ? NA 134 (L) 05/24/2021  ? K 3.9 05/24/2021  ? CL 105 05/24/2021  ? CO2 24 05/24/2021  ? BUN 10 05/24/2021  ? CREATININE 0.64 05/24/2021  ? GLUCOSE 114 (H) 05/24/2021  ? GFRNONAA >60 05/24/2021  ? CALCIUM 8.6 (L) 05/24/2021  ? PHOS 3.3 05/21/2021  ? PROT 6.7 05/21/2021  ? ALBUMIN 3.3 (L) 05/21/2021  ? BILITOT 0.8 05/21/2021  ? ALKPHOS 67 05/21/2021  ? AST 27 05/21/2021  ? ALT 23 05/21/2021  ? ANIONGAP 5 05/24/2021  ? ? ?GFR: ?Estimated Creatinine Clearance: 48.4 mL/min (by C-G formula based on SCr of 0.64 mg/dL). ? ?Recent Results (from the past 240 hour(s))  ?MRSA Next Gen by PCR, Nasal     Status: None  ? Collection Time: 05/21/21  9:02 AM  ? Specimen: Nasal Mucosa; Nasal Swab  ?Result Value Ref Range Status  ? MRSA by PCR Next Gen NOT DETECTED NOT  DETECTED Final  ?  Comment: (NOTE) ?The GeneXpert MRSA Assay (FDA approved for NASAL specimens only), ?is one component of a comprehensive MRSA colonization surveillance ?program. It is not intended to diagnose MRSA infection nor to guide ?or monitor treatment for MRSA infections. ?Test performance is not FDA approved in patients less than 2 years ?old. ?Performed at [facility], [address] ?[place], [place] [address] ?  ?  ? ? ?Radiology Studies: ?No results found. ? ? ? LOS: 8 days  ? ? ?Cordelia Poche, MD ?[facility] ?05/27/2021, 9:22 AM ? ? ?If 7PM-7AM, please contact night-coverage ?[email or web address] ? ?

## 2021-05-28 DIAGNOSIS — C7801 Secondary malignant neoplasm of right lung: Secondary | ICD-10-CM

## 2021-05-28 DIAGNOSIS — I82220 Acute embolism and thrombosis of inferior vena cava: Secondary | ICD-10-CM | POA: Diagnosis not present

## 2021-05-28 DIAGNOSIS — D62 Acute posthemorrhagic anemia: Secondary | ICD-10-CM | POA: Diagnosis not present

## 2021-05-28 DIAGNOSIS — R042 Hemoptysis: Secondary | ICD-10-CM | POA: Diagnosis not present

## 2021-05-28 DIAGNOSIS — C541 Malignant neoplasm of endometrium: Secondary | ICD-10-CM | POA: Diagnosis not present

## 2021-05-28 DIAGNOSIS — C7802 Secondary malignant neoplasm of left lung: Secondary | ICD-10-CM

## 2021-05-28 LAB — PREPARE PLATELET PHERESIS
Unit division: 0
Unit division: 0

## 2021-05-28 LAB — CBC
HCT: 23 % — ABNORMAL LOW (ref 36.0–46.0)
Hemoglobin: 7.7 g/dL — ABNORMAL LOW (ref 12.0–15.0)
MCH: 32.8 pg (ref 26.0–34.0)
MCHC: 33.5 g/dL (ref 30.0–36.0)
MCV: 97.9 fL (ref 80.0–100.0)
Platelets: 58 10*3/uL — ABNORMAL LOW (ref 150–400)
RBC: 2.35 MIL/uL — ABNORMAL LOW (ref 3.87–5.11)
RDW: 17.2 % — ABNORMAL HIGH (ref 11.5–15.5)
WBC: 4.4 10*3/uL (ref 4.0–10.5)
nRBC: 0 % (ref 0.0–0.2)

## 2021-05-28 LAB — BPAM PLATELET PHERESIS
Blood Product Expiration Date: 202304032359
Blood Product Expiration Date: 202304242359
ISSUE DATE / TIME: 202304010007
ISSUE DATE / TIME: 202304011418
Unit Type and Rh: 5100
Unit Type and Rh: 8400

## 2021-05-28 LAB — HEMOGLOBIN AND HEMATOCRIT, BLOOD
HCT: 22.5 % — ABNORMAL LOW (ref 36.0–46.0)
HCT: 26.2 % — ABNORMAL LOW (ref 36.0–46.0)
Hemoglobin: 7.7 g/dL — ABNORMAL LOW (ref 12.0–15.0)
Hemoglobin: 9 g/dL — ABNORMAL LOW (ref 12.0–15.0)

## 2021-05-28 LAB — PREPARE RBC (CROSSMATCH)

## 2021-05-28 MED ORDER — SODIUM CHLORIDE 0.9% IV SOLUTION: Freq: Once | INTRAVENOUS | Status: AC

## 2021-05-28 NOTE — Progress Notes (Addendum)
? ?NAME:  Kristen Freeman, MRN:  [id number], DOB:  [birth year], LOS: 9 ?ADMISSION DATE:  05/19/2021, CONSULTATION DATE:  05/20/2021 ?REFERRING MD: Antonieta Pert - [facility] CHIEF COMPLAINT:  Hemoptysis  ? ?History of Present Illness:  ?71 year old female who presented to [facility] 3/24 for hemoptysis. PMHx significant for HTN, HLD, glaucoma and endometrial CA (diagnosed 2019, s/p radiation and chemo). She has been under the care of Dr. Beatrix Fetters at [facility]; received chemo and was in remission until 01/2021 when she had recurring abdominal lymphadenopathy. ? ?Patient was doing well until 3/22 when she noticed a small amount of hemoptysis early in the morning. Recurrent hemoptysis was noted 3/23 and 3/24. She presented to the emergency department in [place], [place]. CT Chest demonstrated a mass, prompting referral to [facility] for treatment/management.   ? 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 There is one mass 7.6cm with bulky adenopathy.  CXR shows bilateral lower lobe density right greater than left. ? ?Bronchoscopy showed endobronchial mass and bronchus intermedius with overlying clot  ? ?Pertinent Past Medical History:  ?Endometrial CA, glaucoma, HLD, HTN ? ?Significant Hospital Events:  ?05/19/2021 - Admit ?3/26 Bronchoscopy per Dr. Chase Caller >>  Rt bronchus intermedius lesion - clot with tumor. Blocking entire distal airay into RML and RLL, IR deferred embolization  ?3/27 Urgent Referral to radiation oncology to control bleeding. Hgb drop from 7.6 to 6.9 overnight. ?3/28 CT A/P demonstrating filling defect in infrarenal IVC c/w VTE and bulky adenopathy in retroperitoneum; RLL consolidation mass with multiple metastatic pulmonary nodules. IR consulted for IVC filter placement. XRT. ?3/29 MRI brain negative for malignancy ?4/1 PCCM reconsulted for worsening hemoptysis , discussed with IR, CT angiogram shows increase in right lower lobe mass with enhancement, airway appears clear ? ?Interim History / Subjective:   ? ?She is still coughing up  clots and frank blood but decreased compared to yesterday ?Remains tachycardic, oxygenating well on room air ? ?Objective:  ?Blood pressure 117/67, pulse (!) 105, temperature 98.4 ?F (36.9 ?C), temperature source Oral, resp. rate 17, height 4' 11" (1.499 m), weight 55.8 kg, SpO2 93 %. ?   ?   ? ?Intake/Output Summary (Last 24 hours) at 05/28/2021 1456 ?Last data filed at 05/27/2021 2159 ?Gross per 24 hour  ?Intake 430.5 ml  ?Output --  ?Net 430.5 ml  ? ? ?Filed Weights  ? 05/22/21 0800 05/24/21 2229  ?Weight: 55.9 kg 55.8 kg  ? ?Physical Examination: ?General: elderly appearing female in no distress ?HEENT: [place]/AT, PERRL, no JVD ?Neuro: Alert, oriented, non-focal ?CV: S1-S2 regular ?PULM: Clear breath sounds bilateral, no accessory muscle use ?GI: Soft, NT, ND ?Extremities: no acute deformity ?Skin: grossly intact  ? ? ?Labs show drop in hemoglobin from 9.6-7.7, platelets improved from 41-58 after transfusion ? ?Resolved Hospital Problem List:  ? ? ?Assessment & Plan:  ? ?Hemoptysis since 05/17/2021:  Present on admission. Due to pulmonary metastasis from endometrial cancer. Bronchoscopy 3/26 with clot overlying lesion at bronchus intermedius blocking airway to RML and RLL. Worse on 4/1.  Repeat CT shows airway has cleared suggesting that this was clot rather than tumor. ?Tumor appears to be mainly parenchymal ?- Continue Rad Onc treatment (targeted for hemostasis) ?-  TXA neb x 6 doses ?-Tussionex every 6 hours to abolish cough ?-Options limited here , discussed with IR -since she has parenchymal met, can consider bronchial artery embolization as salvage procedure should she have massive hemoptysis ?-Otherwise if worsening would need intubation, doubt debulking with cryo is an option since airway  appears clear now ? ? 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  ?- not a candidate for IVC filter due to location of IVC thrombus ?- Not currently a candidate for  anticoagulation due to hemoptysis ?- hold anticoagulation ? ?Endometrial cancer: rapidly progressing now including metastasis to at least the lungs and now likely intraabdominal mets. MRI brain negative. ?Radiation oncology and medical oncology following, s/p simulation 3/27. ?- XRT as above ?-Oncology following, not stable to start palliative chemo ?-Palliative radiation in progress ? ?Thrombocytopenia ?- Trend CBC (s/p 1U PRBCs 3/27, 1U Plt 3/26 & 4/1) ?- Transfuse for Hgb < 7, Plt < 50 or hemodynamically significant bleeding ? ?Muddy ?- Remains full code ?-Son & pt updated at bedside.  Explained that current therapies are mainly palliative and cancer has been progressive in spite of oncology efforts.  Advised palliative care consultation ? ?Best practice (daily eval):  ?Per Primary Team ? ?Signature:  ? ? ?Kara Mead MD. Shade Flood. ?[facility] ?Pager : [phone] ? ?If no response to pager , please call [phone] until 7 pm ?After 7:00 pm call Elink  [phone]  ? ? ? ?05/28/2021 2:56 PM ? ? ? ? ?

## 2021-05-28 NOTE — Progress Notes (Addendum)
? ?PROGRESS NOTE ? ? ? ?Kristen Freeman  QMV:[id number] DOB: 31-Dec-1950 DOA: 05/19/2021 ?PCP: Allie Dimmer, MD ? ? ?Brief Narrative: ?71 year old female with history of endometrial cancer in 2019 and in remission until Dec '22 when she was found to have recurrence, underwent XRT and chemo completing treatment 04/25/21. She had an episode of hemoptysis 05/17/21. She sought care at ED in [place]. She was found to have thrombocytopenia and did had platelet transfusion. Other Lab - Glucose 115, INR 1.1, Hgb 8.4, WBC 4.4, Plt 59, nl Diff. CTA chest - increased pulmonary mets: multiple large nodules. ? Post-obstructive consolidation with nodule 7.6x3.2x2 cm. Bulky adenopathy.EKG - nl.  She was subsequently transferred to [facility] for further evaluation and management.   ?3.26- transferred to stepdown/icu for hemoptysis recurring- s/p bronchoscopy: showed "1. 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 Blocking entire distal airay into RML and RLL" ?Rad on hemonc and palliative team were consulted 3/27 ? ? ?Assessment and Plan: ? ?Hemoptysis ?Secondary to right bronchus intermedius tumor with associated clot. Recommendation to not pursue IR embolization, but rather consideration of radiation oncology for tumor debulking. Radiation oncology consulted and have initiated plan for radiation treatments x10. Treatment course started on 3/27. Patient developed recurrent hemoptysis on 4/1. Tranexamic acid nebulization initiated. IR re-consulted. Repeat CT angio significant for multiple areas of pulmonary hemorrhaging surrounding metastatic masses.  ?-Hycodan QID ?-PCCM recommendations: Tranexamic acid nebulizer q6 hours x6 doses ?-IR recommendations: could consider embolization, but unsure of utility. Formal recommendations pending ? ?Right bronchus intermedius lesion ?Noted on bronchoscopy. Radiation therapy started. Lesion has increased since initial CT. ? ?IVC thrombus ?Noted on CT abdomen/pelvis, confirmed  by venous duplex. Complicated by recent hemoptysis. IR consulted for consideration of thrombectomy/IVC filter, but recommended against both at this time with preference not to place a suprarenal IVC filter. Heparin planned, however now deferred secondary to worsening thrombocytopenia ?-Observe with plans to start Heparin IV (without bolus) and eventual transition to long term anticoagulation if patient tolerates without recurrent hemoptysis and thrombocytopenia improves ? ?Metastatic endometrial cancer ?Lung metastasis ?Lymph node metastasis ?Patient is followed by gynecology/oncology as an outpatient for prior endometrial cancer diagnosis. Hematology/oncology consulted this admission for consideration of palliative chemotherapy starting 4/3. Palliative radiation started on 3/27 as mentioned above. Evidence of worsening metastatic disease on repeat CT chest on 4/1. ?-Medical oncology recommendations ? ?Thrombocytopenia ?Slightly downward drift. Hematology/oncology recommending goal of >50,000. 1 unit of platelets transfused on 3/26. Platelets of 41,000 on CBC 4/1 with worsened hemoptysis, so 1 unit of platelets transfused on 4/1. ?-Daily CBC ? ?Anemia ?Likely chronic component in setting of metastatic cancer and resultant chronic blood loss anemia. Patient with an associated acute component secondary to hemoptysis and likely bleeding from lung mass. Patient has received 3 units of PRBC to date. Currently with ongoing hemoptysis. Hemoglobin down to 7.7 on CBC today ?-Will confirm transfusion goal with medical oncology; previously transfused for a hemoglobin 8.0 ?-Daily CBC ?-H&H this afternoon ? ?Hypokalemia ?Mild. Resolved with repletion. ? ?Glaucoma ?-Continue latanoprost ? ?Primary hypertension ?Hydrochlorothiazide and metoprolol initially held. Metoprolol restarted. Blood pressure stable. ?-Continue metoprolol ? ?50 minutes spent at bedside/unit reviewing and discussing findings of CT chest, plan for hemoptysis  and anemia. Plan discussed with patient, her son and her daughter at bedside. Findings and plan discussed with pulmonology at bedside. ? ? ?DVT prophylaxis: SCDs ?Code Status:   Code Status: Full Code ?Family Communication: Son at bedside and daughter on telephone ?Disposition Plan: SDU Eventually, discharge home  vs SNF pending ability to start anticoagulation, stability of hemoglobin/platelets, specialist recommendations for chemotherapy ? ? ?Consultants:  ?Medical oncology ?PCCM ?Radiation oncology ?Interventional radiology ? ?Procedures:  ?Flexible bronchoscopy (05/21/2021) ? ?Antimicrobials: ?None  ? ? ?Subjective: ?Hemoptysis has slowed down since yesterday but still present. Coughing has improved. No dyspnea. ? ?Objective: ?BP 115/79   Pulse (!) 105   Temp 100.1 ?F (37.8 ?C) (Oral) Comment: RN notified  Resp 17   Ht _0  (1.499 m)   Wt 55.8 kg   SpO2 93%   BMI 24.85 kg/m?  ? ?Examination: ? ?General exam: Appears calm and comfortable ?Respiratory system: Bilateral rales. Respiratory effort normal. ?Cardiovascular system: S1 & S2 heard, RRR. No murmurs, rubs, gallops or clicks. ?Gastrointestinal system: Abdomen is nondistended, soft and nontender. No organomegaly or masses felt. Normal bowel sounds heard. ?Central nervous system: Alert and oriented. No focal neurological deficits. ?Musculoskeletal: No edema. No calf tenderness ?Skin: No cyanosis. No rashes ?Psychiatry: Judgement and insight appear normal. ? ? ?Data Reviewed: I have personally reviewed following labs and imaging studies ? ?CBC ?Lab Results  ?Component Value Date  ? WBC 4.4 05/28/2021  ? RBC 2.35 (L) 05/28/2021  ? HGB 7.7 (L) 05/28/2021  ? HCT 23.0 (L) 05/28/2021  ? MCV 97.9 05/28/2021  ? MCH 32.8 05/28/2021  ? PLT 58 (L) 05/28/2021  ? MCHC 33.5 05/28/2021  ? RDW 17.2 (H) 05/28/2021  ? LYMPHSABS 0.4 (L) 05/23/2021  ? MONOABS 0.5 05/23/2021  ? EOSABS 0.1 05/23/2021  ? BASOSABS 0.0 05/23/2021  ? ? ? ?Last metabolic panel ?Lab Results   ?Component Value Date  ? NA 134 (L) 05/24/2021  ? K 3.9 05/24/2021  ? CL 105 05/24/2021  ? CO2 24 05/24/2021  ? BUN 10 05/24/2021  ? CREATININE 0.64 05/24/2021  ? GLUCOSE 114 (H) 05/24/2021  ? GFRNONAA >60 05/24/2021  ? CALCIUM 8.6 (L) 05/24/2021  ? PHOS 3.3 05/21/2021  ? PROT 6.7 05/21/2021  ? ALBUMIN 3.3 (L) 05/21/2021  ? BILITOT 0.8 05/21/2021  ? ALKPHOS 67 05/21/2021  ? AST 27 05/21/2021  ? ALT 23 05/21/2021  ? ANIONGAP 5 05/24/2021  ? ? ?GFR: ?Estimated Creatinine Clearance: 48.4 mL/min (by C-G formula based on SCr of 0.64 mg/dL). ? ?Recent Results (from the past 240 hour(s))  ?MRSA Next Gen by PCR, Nasal     Status: None  ? Collection Time: 05/21/21  9:02 AM  ? Specimen: Nasal Mucosa; Nasal Swab  ?Result Value Ref Range Status  ? MRSA by PCR Next Gen NOT DETECTED NOT DETECTED Final  ?  Comment: (NOTE) ?The GeneXpert MRSA Assay (FDA approved for NASAL specimens only), ?is one component of a comprehensive MRSA colonization surveillance ?program. It is not intended to diagnose MRSA infection nor to guide ?or monitor treatment for MRSA infections. ?Test performance is not FDA approved in patients less than 2 years ?old. ?Performed at [facility], [address] ?[place], [place] [address] ?  ?  ? ? ?Radiology Studies: ?CT ANGIO CHEST AORTA W/CM & OR WO/CM ? ?Result Date: 05/27/2021 ?CLINICAL DATA:  Soft tissue mass in the chest. C targets for bronchial artery embolization. EXAM: CT ANGIOGRAPHY CHEST WITH CONTRAST TECHNIQUE: Multidetector CT imaging of the chest was performed using the standard protocol during bolus administration of intravenous contrast. Multiplanar CT image reconstructions and MIPs were obtained to evaluate the vascular anatomy. RADIATION DOSE REDUCTION: This exam was performed according to the departmental dose-optimization program which includes automated exposure control, adjustment of the mA  and/or kV according to patient size and/or use of iterative reconstruction  technique. CONTRAST:  37m OMNIPAQUE IOHEXOL 350 MG/ML SOLN COMPARISON:  CT angiogram chest 05/21/2021. FINDINGS: Cardiovascular: Satisfactory opacification of the pulmonary arteries to the segmental level. No evidence of

## 2021-05-29 ENCOUNTER — Ambulatory Visit
Admission: RE | Admit: 2021-05-29 | Discharge: 2021-05-29 | Disposition: A | Discharge status: Home / Self Care | Payer: Medicare Other | Source: Ambulatory Visit | Attending: Radiation Oncology | Admitting: Radiation Oncology

## 2021-05-29 DIAGNOSIS — I10 Essential (primary) hypertension: Secondary | ICD-10-CM | POA: Diagnosis not present

## 2021-05-29 DIAGNOSIS — D62 Acute posthemorrhagic anemia: Secondary | ICD-10-CM | POA: Diagnosis not present

## 2021-05-29 DIAGNOSIS — C541 Malignant neoplasm of endometrium: Secondary | ICD-10-CM | POA: Diagnosis not present

## 2021-05-29 DIAGNOSIS — R042 Hemoptysis: Secondary | ICD-10-CM | POA: Diagnosis not present

## 2021-05-29 LAB — TYPE AND SCREEN
ABO/RH(D): O POS
Antibody Screen: NEGATIVE
Unit division: 0
Unit division: 0

## 2021-05-29 LAB — CBC
HCT: 28.7 % — ABNORMAL LOW (ref 36.0–46.0)
Hemoglobin: 9.9 g/dL — ABNORMAL LOW (ref 12.0–15.0)
MCH: 32.8 pg (ref 26.0–34.0)
MCHC: 34.5 g/dL (ref 30.0–36.0)
MCV: 95 fL (ref 80.0–100.0)
Platelets: 54 10*3/uL — ABNORMAL LOW (ref 150–400)
RBC: 3.02 MIL/uL — ABNORMAL LOW (ref 3.87–5.11)
RDW: 17 % — ABNORMAL HIGH (ref 11.5–15.5)
WBC: 3.7 10*3/uL — ABNORMAL LOW (ref 4.0–10.5)
nRBC: 0 % (ref 0.0–0.2)

## 2021-05-29 LAB — BPAM RBC
Blood Product Expiration Date: 202304242359
Blood Product Expiration Date: 202304282359
ISSUE DATE / TIME: 202303311649
ISSUE DATE / TIME: 202304021551
Unit Type and Rh: 5100
Unit Type and Rh: 5100

## 2021-05-29 MED ORDER — DEXAMETHASONE 6 MG PO TABS
12.0000 mg | ORAL_TABLET | Freq: Once | ORAL | Status: DC
  Filled 2021-05-29: qty 2

## 2021-05-29 NOTE — Progress Notes (Addendum)
? ?PROGRESS NOTE ? ? ? ?Kristen Freeman  JWJ:[id number] DOB: November 19, 1950 DOA: 05/19/2021 ?PCP: Allie Dimmer, MD ? ? ?Brief Narrative: ?71 year old female with history of endometrial cancer in 2019 and in remission until Dec '22 when she was found to have recurrence, underwent XRT and chemo completing treatment 04/25/21. She had an episode of hemoptysis 05/17/21. She sought care at ED in [place]. She was found to have thrombocytopenia and did had platelet transfusion. Other Lab - Glucose 115, INR 1.1, Hgb 8.4, WBC 4.4, Plt 59, nl Diff. CTA chest - increased pulmonary mets: multiple large nodules. ? Post-obstructive consolidation with nodule 7.6x3.2x2 cm. Bulky adenopathy.EKG - nl.  She was subsequently transferred to [facility] for further evaluation and management.   ?3.26- transferred to stepdown/icu for hemoptysis recurring- s/p bronchoscopy: showed "1. 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 Blocking entire distal airay into RML and RLL" ?Rad on hemonc and palliative team were consulted 3/27 ? ? ?Assessment and Plan: ? ?Hemoptysis ?Secondary to right bronchus intermedius tumor with associated clot. Recommendation to not pursue IR embolization, but rather consideration of radiation oncology for tumor debulking. Radiation oncology consulted and have initiated plan for radiation treatments x10. Treatment course started on 3/27. Patient developed recurrent hemoptysis on 4/1. Tranexamic acid nebulization initiated. IR re-consulted. Repeat CT angio significant for multiple areas of pulmonary hemorrhaging surrounding metastatic masses.  ?-Hycodan QID ?-PCCM recommendations: Tranexamic acid nebulizer q6 hours x6 doses (last dose 4/3) ?-IR recommendations: Could consider embolization, but unsure of utility. Formal recommendations pending ? ?Right bronchus intermedius lesion ?Noted on bronchoscopy. Radiation therapy started. Lesion has increased since initial CT. ? ?IVC thrombus ?Noted on CT  abdomen/pelvis, confirmed by venous duplex. Complicated by recent hemoptysis. IR consulted for consideration of thrombectomy/IVC filter, but recommended against both at this time with preference not to place a suprarenal IVC filter. Heparin planned, however now deferred secondary to worsening thrombocytopenia ?-Observe with plans to start Heparin IV (without bolus) and eventual transition to long term anticoagulation if patient tolerates without recurrent hemoptysis and thrombocytopenia improves ? ?Metastatic endometrial cancer ?Lung metastasis ?Lymph node metastasis ?Patient is followed by gynecology/oncology as an outpatient for prior endometrial cancer diagnosis. Hematology/oncology consulted this admission for consideration of palliative chemotherapy starting 4/3. Palliative radiation started on 3/27 as mentioned above. Evidence of worsening metastatic disease on repeat CT chest on 4/1. ?-Medical oncology recommendations: pending today. Per patient/family, deferring initiation of chemotherapy to no earlier than 4/5 ? ?Thrombocytopenia ?Slightly downward drift. Hematology/oncology recommending goal of >50,000. 1 unit of platelets transfused on 3/26. Platelets of 41,000 on CBC 4/1 with worsened hemoptysis, so 1 unit of platelets transfused on 4/1. Post-transfusion platelets of 58,000, now down to 54,000 on CBC ?-Daily CBC ? ?Anemia ?Likely chronic component in setting of metastatic cancer and resultant chronic blood loss anemia. Patient with an associated acute component secondary to hemoptysis and likely bleeding from lung mass. Patient has received 3 units of PRBC to date. Currently with ongoing hemoptysis. Hemoglobin down to 7.7 on CBC but patient developed symptoms of atrial fibrillation with RVR. 1 unit of PRBC transfused on 4/2. Post-transfusion hemoglobin of 9 > 9.9. ?-Daily CBC ? ?Paroxysmal atrial fibrillation ?Patient with a transient episode, possibly precipitated by acute anemia. Heart back in NSR.  Patient not a candidate for anticoagulation per above. ?-Continue telemetry ? ?Hypokalemia ?Mild. Resolved with repletion. ? ?Glaucoma ?-Continue latanoprost ? ?Primary hypertension ?Hydrochlorothiazide and metoprolol initially held. Metoprolol restarted. Blood pressure stable. ?-Continue metoprolol ? ? ?DVT prophylaxis: SCDs ?Code  Status:   Code Status: Full Code ?Family Communication: Son and daughter at bedside ?Disposition Plan: Transfer to telemetry, discharge home vs SNF pending ability to start anticoagulation, stability of hemoglobin/platelets, specialist recommendations for chemotherapy ? ? ?Consultants:  ?Medical oncology ?PCCM ?Radiation oncology ?Interventional radiology ? ?Procedures:  ?Flexible bronchoscopy (05/21/2021) ? ?Antimicrobials: ?None  ? ? ?Subjective: ?No hemoptysis overnight. No recurrent atrial fibrillation. ? ?Objective: ?BP 95/60 (BP Location: Right Arm)   Pulse 88   Temp 97.8 ?F (36.6 ?C) (Oral)   Resp (!) 25   Ht _0  (1.499 m)   Wt 55.8 kg   SpO2 98%   BMI 24.85 kg/m?  ? ?Examination: ? ?General exam: Appears calm and comfortable ?Respiratory system: Diffuse mild rales. Respiratory effort normal. ?Cardiovascular system: S1 & S2 heard, RRR. No murmurs, rubs, gallops or clicks. ?Central nervous system: Alert and oriented. No focal neurological deficits. ?Musculoskeletal: No calf tenderness ?Skin: No cyanosis. No rashes ?Psychiatry: Judgement and insight appear normal. Mood & affect appropriate.  ? ? ?Data Reviewed: I have personally reviewed following labs and imaging studies ? ?CBC ?Lab Results  ?Component Value Date  ? WBC 3.7 (L) 05/29/2021  ? RBC 3.02 (L) 05/29/2021  ? HGB 9.9 (L) 05/29/2021  ? HCT 28.7 (L) 05/29/2021  ? MCV 95.0 05/29/2021  ? MCH 32.8 05/29/2021  ? PLT 54 (L) 05/29/2021  ? MCHC 34.5 05/29/2021  ? RDW 17.0 (H) 05/29/2021  ? LYMPHSABS 0.4 (L) 05/23/2021  ? MONOABS 0.5 05/23/2021  ? EOSABS 0.1 05/23/2021  ? BASOSABS 0.0 05/23/2021  ? ? ? ?Last metabolic  panel ?Lab Results  ?Component Value Date  ? NA 134 (L) 05/24/2021  ? K 3.9 05/24/2021  ? CL 105 05/24/2021  ? CO2 24 05/24/2021  ? BUN 10 05/24/2021  ? CREATININE 0.64 05/24/2021  ? GLUCOSE 114 (H) 05/24/2021  ? GFRNONAA >60 05/24/2021  ? CALCIUM 8.6 (L) 05/24/2021  ? PHOS 3.3 05/21/2021  ? PROT 6.7 05/21/2021  ? ALBUMIN 3.3 (L) 05/21/2021  ? BILITOT 0.8 05/21/2021  ? ALKPHOS 67 05/21/2021  ? AST 27 05/21/2021  ? ALT 23 05/21/2021  ? ANIONGAP 5 05/24/2021  ? ? ?GFR: ?Estimated Creatinine Clearance: 48.4 mL/min (by C-G formula based on SCr of 0.64 mg/dL). ? ?Recent Results (from the past 240 hour(s))  ?MRSA Next Gen by PCR, Nasal     Status: None  ? Collection Time: 05/21/21  9:02 AM  ? Specimen: Nasal Mucosa; Nasal Swab  ?Result Value Ref Range Status  ? MRSA by PCR Next Gen NOT DETECTED NOT DETECTED Final  ?  Comment: (NOTE) ?The GeneXpert MRSA Assay (FDA approved for NASAL specimens only), ?is one component of a comprehensive MRSA colonization surveillance ?program. It is not intended to diagnose MRSA infection nor to guide ?or monitor treatment for MRSA infections. ?Test performance is not FDA approved in patients less than 2 years ?old. ?Performed at [facility], [address] ?[place], [place] [address] ?  ?  ? ? ?Radiology Studies: ?CT ANGIO CHEST AORTA W/CM & OR WO/CM ? ?Result Date: 05/27/2021 ?CLINICAL DATA:  Soft tissue mass in the chest. C targets for bronchial artery embolization. EXAM: CT ANGIOGRAPHY CHEST WITH CONTRAST TECHNIQUE: Multidetector CT imaging of the chest was performed using the standard protocol during bolus administration of intravenous contrast. Multiplanar CT image reconstructions and MIPs were obtained to evaluate the vascular anatomy. RADIATION DOSE REDUCTION: This exam was performed according to the departmental dose-optimization program which includes automated exposure control,  adjustment of the mA and/or kV according to patient size and/or use of iterative  reconstruction technique. CONTRAST:  70m OMNIPAQUE IOHEXOL 350 MG/ML SOLN COMPARISON:  CT angiogram chest 05/21/2021. FINDINGS: Cardiovascular: Satisfactory opacification of the pulmonary arteries to the segmental level. No evid

## 2021-05-29 NOTE — Progress Notes (Signed)
? ?NAME:  Kristen Freeman, MRN:  [id number], DOB:  1950-07-30, LOS: 10 ?ADMISSION DATE:  05/19/2021, CONSULTATION DATE:  05/20/2021 ?REFERRING MD: Antonieta Pert - TR[facility] CHIEF COMPLAINT:  Hemoptysis  ? ?History of Present Illness:  ?71 year old female who presented to [facility] 3/24 for hemoptysis. PMHx significant for HTN, HLD, glaucoma and endometrial CA (diagnosed 2019, s/p radiation and chemo). She has been under the care of Dr. Beatrix Fetters at [facility]; received chemo and was in remission until 01/2021 when she had recurring abdominal lymphadenopathy. ? ?Patient was doing well until 3/22 when she noticed a small amount of hemoptysis early in the morning. Recurrent hemoptysis was noted 3/23 and 3/24. She presented to the emergency department in [place], [place]. CT Chest demonstrated a mass, prompting referral to [facility] for treatment/management.   ? 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 There is one mass 7.6cm with bulky adenopathy.  CXR shows bilateral lower lobe density right greater than left. ? ?Bronchoscopy showed endobronchial mass and bronchus intermedius with overlying clot  ? ?Pertinent Past Medical History:  ?Endometrial CA, glaucoma, HLD, HTN ? ?Significant Hospital Events:  ?05/19/2021 - Admit ?3/26 Bronchoscopy per Dr. Chase Caller >>  Rt bronchus intermedius lesion - clot with tumor. Blocking entire distal airay into RML and RLL, IR deferred embolization  ?3/27 Urgent Referral to radiation oncology to control bleeding. Hgb drop from 7.6 to 6.9 overnight. ?3/28 CT A/P demonstrating filling defect in infrarenal IVC c/w VTE and bulky adenopathy in retroperitoneum; RLL consolidation mass with multiple metastatic pulmonary nodules. IR consulted for IVC filter placement. XRT. ?3/29 MRI brain negative for malignancy ?4/1 PCCM reconsulted for worsening hemoptysis , discussed with IR, CT angiogram shows increase in right lower lobe mass with enhancement, airway appears clear.  TXA nebs restarted  ? ?Interim  History / Subjective:  ? ?No further hemoptysis since yesterday am- which was small amount. Finishing TXA nebs today.  Denies SOB.  On room air, pending tx to floor.  Scheduled for XRT later today.   ? ?Pending tx to floor.  ? ?Objective:  ?Blood pressure (!) 144/88, pulse (!) 102, temperature 97.8 ?F (36.6 ?C), temperature source Oral, resp. rate 19, height '4\' 11"'$  (1.499 m), weight 55.8 kg, SpO2 99 %. ?   ?   ? ?Intake/Output Summary (Last 24 hours) at 05/29/2021 1115 ?Last data filed at 05/29/2021 0507 ?Gross per 24 hour  ?Intake 1136.82 ml  ?Output --  ?Net 1136.82 ml  ? ?Filed Weights  ? 05/22/21 0800 05/24/21 2229  ?Weight: 55.9 kg 55.8 kg  ? ?Physical Examination: ?General:  Pleasant elderly female sitting in bedside recliner in NAD, daughter at  bedside ?HEENT: MM pink/moist ?Neuro:  Alert, appropriate, MAE ?CV: rr, NSR ?PULM:  non labored, few scattered crackles bibasilar  ?GI: soft, bs+, NT ?Extremities: warm/dry, no LE edema  ?Skin: no rashes  ? ?S/p 1 unit PRBC 4/2. Hgb 7.7> 9.9, ptls 58> 54, WBC 4.4> 3.7 ? ? ?Resolved Hospital Problem List:  ? ? ?Assessment & Plan:  ? ?Hemoptysis since 05/17/2021:  Present on admission. Due to pulmonary metastasis from rapidly progressive endometrial cancer. Bronchoscopy 3/26 with clot overlying lesion at bronchus intermedius blocking airway to RML and RLL. TXA nebs 3/26 > 3/28.  Hemoptysis worse on 4/1.  Repeat CT shows airway has cleared suggesting that this was clot rather than tumor.  Tumor appears to be mainly parenchymal and even slightly enlarged on repeat 4/1 CT ?- Continue Rad Onc treatment (targeted for hemostasis).  Plans for XRT  4/3.  ?-  TXA neb x 6 doses- completes tonight 4/3 ?-  Hycodan prn for cough suppression ?- pending final recs from IR if bronchial artery embolization as salvage procedure could be an option in the event of recurrent massive hemoptysis. Unclear utility.  At this time, remains full code.  Needs ongoing PMT discussions  ?- pulmonary will  continue to follow ? ? ?Remainder per Primary team:  ? 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  ?- not a candidate for IVC filter due to location of IVC thrombus ?- Not currently a candidate for anticoagulation due to hemoptysis ?- holding anticoagulation ? ?Endometrial cancer: rapidly progressing now including metastasis to at least the lungs and now likely intraabdominal mets. MRI brain negative. ?Radiation oncology and medical oncology following, s/p simulation 3/27. ?- XRT as above ?- Oncology following, plans to start palliative chemo when counts are better ?- Palliative radiation in progress ? ?Thrombocytopenia ?- Trend CBC (s/p 1U PRBCs 4/2, 1U Plt 3/26 & 4/1) ?- Transfuse for Hgb < 7, Plt < 50 or hemodynamically significant bleeding ? ?[place] ?- Remains full code ?- Current therapies are mainly palliative and cancer has been progressive in spite of oncology efforts.  Palliative care have been consulted.   ? ?Best practice (daily eval):  ?Per Primary Team ? ?Signature:  ? ? ? ?Kennieth Rad, ACNP ?[facility] ?05/29/2021, 11:15 AM ? ?See Amion for pager ?If no response to pager, please call PCCM consult pager ?After 7:00 pm call Elink   ? ? ? ? ?

## 2021-05-29 NOTE — Progress Notes (Signed)
Kristen Freeman   DOB:[birth year]   IF#:[id number]   ? 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 ?Unfortunately, due to recurrent hemoptysis, she is transferred back to ICU ?I will continue to follow daily ?Once she is out of ICU, we will resume the plan for chemotherapy ?She will continue palliative radiation treatment for now ?  ?Hemoptysis, recurrent ?Secondary to lung metastasis ?She is undergoing palliative radiation therapy to her chest ?Continue supportive care ?  ?IVC thrombus ?CT abdomen/pelvis shows a filling defect in the infrarenal IVC and bilateral lower extremity Doppler ultrasound confirmed DVT  ?unable to anticoagulate secondary to recent hemoptysis ?IR as well as vascular surgery have been requested for consideration of thrombectomy versus IVC filter placement, overall, deemed not a candidate for thrombectomy or IVC filter placement ?I reviewed the danger of not treating her IVC thrombus with the patient and family ?Due to her significant bleeding risk, at this point, we have no choice but to observe ?If her platelet count started to trend over 50,000, we will start and initiate anticoagulation therapy with IV heparin without bolus ?She is not stable yet today to start heparin ?  ?Macrocytic anemia ?Anemia due to recent hemoptysis, transfused multiple times ?Serum iron and B12 level were within normal range ?I have started her on folic acid supplementation ?  ?Thrombocytopenia ?Likely due to recent chemoradiation therapy in [place] as well as consumptive secondary to recent bleeding  ?Received 1 unit of platelets on 05/21/2021 due to active bleeding ?Monitor closely ?I recommend keeping platelet count at 50,000 or above. ? ?Goals of care ?I have a long discussion with patient and family in regards to [place] and goals of care ?She would like to receive aggressive care as much as possible ?In the event of cardiorespiratory arrest, she would like  to be resuscitated but she does not want to be maintained on life support indefinitely ?Her husband and 2 sons are her dedicated healthcare power of attorney ?  ?Discharge planning ?She is not safe for discharge due to pancytopenia, ongoing radiation, and treated thrombosis and aggressive disease ?The risk of premature discharge is risk of death ?The plan over the next few days will be to continue radiation therapy and attempt starting her on anticoagulation therapy with IV heparin without bolus ?Transfuse as needed ?She needs inpatient chemotherapy to commence while hospitalized, ?All questions were answered. The patient knows to call the clinic with any problems, questions or concerns. ?  ?The total time spent in the appointment was 55 minutes encounter with patients including review of chart and various tests results, discussions about plan of care and coordination of care plan ? ?Heath Lark, MD ?05/29/2021 7:50 AM ? ?Subjective:  ?I returned to see the patient, currently in the ICU ?Her son and daughter are by the bedside ?I have reviewed records related to events over the past few days ?She was transferred back to the ICU due to recurrent hemoptysis on Saturday ?She received some blood transfusion throughout the weekend ?She has no further hemoptysis this morning ?She denies chest pain or shortness of breath ? ?Objective:  ?Vitals:  ? 05/29/21 0400 05/29/21 0717  ?BP: 95/60   ?Pulse: 88   ?Resp: (!) 25   ?Temp:  97.8 ?F (36.6 ?C)  ?SpO2: 98%   ?  ? ?Intake/Output Summary (Last 24 hours) at 05/29/2021 0750 ?Last data filed at 05/29/2021 0507 ?Gross per 24 hour  ?Intake 1136.82 ml  ?Output --  ?Net  1136.82 ml  ? ? ?GENERAL:alert, no distress and comfortable ?SKIN: skin color, texture, turgor are normal, no rashes or significant lesions ?EYES: normal, Conjunctiva are pink and non-injected, sclera clear ?OROPHARYNX:no exudate, no erythema and lips, buccal mucosa, and tongue normal  ?NECK: supple, thyroid normal size,  non-tender, without nodularity ?LYMPH:  no palpable lymphadenopathy in the cervical, axillary or inguinal ?LUNGS: clear to auscultation and percussion with normal breathing effort ?HEART: regular rate & rhythm and no murmurs and no lower extremity edema ?ABDOMEN:abdomen soft, non-tender and normal bowel sounds ?Musculoskeletal:no cyanosis of digits and no clubbing  ?NEURO: alert & oriented x 3 with fluent speech, no focal motor/sensory deficits ?  ?Labs:  ?Recent Labs  ?  05/21/21 ?1140 05/22/21 ?0441 05/23/21 ?0430 05/24/21 ?0500  ?NA  --  140 135 134*  ?K  --  3.6 3.4* 3.9  ?CL  --  110 105 105  ?CO2  --  '25 24 24  '$ ?GLUCOSE  --  131* 125* 114*  ?BUN  --  11 7* 10  ?CREATININE  --  0.53 0.61 0.64  ?CALCIUM  --  8.7* 8.4* 8.6*  ?GFRNONAA  --  >60 >60 >60  ?PROT 6.7  --   --   --   ?ALBUMIN 3.3*  --   --   --   ?AST 27  --   --   --   ?ALT 23  --   --   --   ?ALKPHOS 67  --   --   --   ?BILITOT 0.8  --   --   --   ?BILIDIR 0.2  --   --   --   ?IBILI 0.6  --   --   --   ? ? ?Studies:  ?CT Angio Chest Pulmonary Embolism (PE) W or WO Contrast ? ?Result Date: 05/21/2021 ?CLINICAL DATA:  71 year old female with history of abnormal chest x-ray. Endometrial cancer. Hemoptysis. * Tracking Code: BO * EXAM: CT ANGIOGRAPHY CHEST WITH CONTRAST TECHNIQUE: Multidetector CT imaging of the chest was performed using the standard protocol during bolus administration of intravenous contrast. Multiplanar CT image reconstructions and MIPs were obtained to evaluate the vascular anatomy. RADIATION DOSE REDUCTION: This exam was performed according to the departmental dose-optimization program which includes automated exposure control, adjustment of the mA and/or kV according to patient size and/or use of iterative reconstruction technique. CONTRAST:  80m OMNIPAQUE IOHEXOL 350 MG/ML SOLN COMPARISON:  No priors. FINDINGS: Comment: Today's study is limited by considerable patient respiratory motion. Cardiovascular: There are no central, lobar  or segmental sized filling defects within the pulmonary arterial tree to suggest pulmonary embolism. Smaller distal subsegmental sized emboli can not be entirely excluded on the basis of today's examination. Heart size is normal. There is no significant pericardial fluid, thickening or pericardial calcification. There is aortic atherosclerosis, as well as atherosclerosis of the great vessels of the mediastinum and the coronary arteries, including calcified atherosclerotic plaque in the left anterior descending coronary arteries. Left internal jugular single-lumen porta cath with tip terminating in the right atrium. Mediastinum/Nodes: There is extensive lymphadenopathy most evident in the superior mediastinum and left supraclavicular region. The largest left supraclavicular lymph nodes measure 2.2 cm in short axis on axial images 5 and 12 of series 4. The largest superior mediastinal nodal mass measures 5.1 x 2.3 cm (axial image 35 of series 4) anterior to the origin of the great vessels. High right paratracheal lymph node measuring 1.6 cm in short axis. Prominent but  nonenlarged bilateral hilar lymph nodes are noted. Middle mediastinal lymphadenopathy measuring up to 2.2 cm in short axis adjacent to the descending thoracic aorta (axial image 96 of series 4). Right retrocrural lymph node (axial image 109 of series 4) measuring 1.9 cm in short axis. Esophagus is unremarkable in appearance. No axillary lymphadenopathy. Lungs/Pleura: Numerous pulmonary nodules and masses are noted throughout the lungs bilaterally. The largest confluent mass or conglomeration of masses is in the right lower lobe (axial image 90 of series 4) measuring 6.7 x 6.7 cm. Several of these lesions demonstrate surrounding ground-glass attenuation, suggesting perilesional hemorrhage. No pleural effusions. Upper Abdomen: Unremarkable. Musculoskeletal: There are no aggressive appearing lytic or blastic lesions noted in the visualized portions of the  skeleton. Review of the MIP images confirms the above findings. IMPRESSION: 1. Despite the mild limitations of today's examination, there is no evidence to suggest clinically significant central, lobar

## 2021-05-30 ENCOUNTER — Ambulatory Visit
Admit: 2021-05-30 | Discharge: 2021-05-30 | Disposition: A | Discharge status: Home / Self Care | Payer: Medicare Other | Attending: Radiation Oncology | Admitting: Radiation Oncology

## 2021-05-30 DIAGNOSIS — D61818 Other pancytopenia: Secondary | ICD-10-CM

## 2021-05-30 DIAGNOSIS — R042 Hemoptysis: Secondary | ICD-10-CM | POA: Diagnosis not present

## 2021-05-30 DIAGNOSIS — I10 Essential (primary) hypertension: Secondary | ICD-10-CM | POA: Diagnosis not present

## 2021-05-30 DIAGNOSIS — D62 Acute posthemorrhagic anemia: Secondary | ICD-10-CM | POA: Diagnosis not present

## 2021-05-30 DIAGNOSIS — E876 Hypokalemia: Secondary | ICD-10-CM

## 2021-05-30 DIAGNOSIS — C541 Malignant neoplasm of endometrium: Secondary | ICD-10-CM | POA: Diagnosis not present

## 2021-05-30 LAB — BASIC METABOLIC PANEL
Anion gap: 5 (ref 5–15)
BUN: 18 mg/dL (ref 8–23)
CO2: 28 mmol/L (ref 22–32)
Calcium: 8.5 mg/dL — ABNORMAL LOW (ref 8.9–10.3)
Chloride: 104 mmol/L (ref 98–111)
Creatinine, Ser: 0.62 mg/dL (ref 0.44–1.00)
GFR, Estimated: >60 mL/min (ref >60)
Glucose, Bld: 115 mg/dL — ABNORMAL HIGH (ref 70–99)
Potassium: 3 mmol/L — ABNORMAL LOW (ref 3.5–5.1)
Sodium: 137 mmol/L (ref 135–145)

## 2021-05-30 LAB — CBC
HCT: 25.8 % — ABNORMAL LOW (ref 36.0–46.0)
Hemoglobin: 8.9 g/dL — ABNORMAL LOW (ref 12.0–15.0)
MCH: 33 pg (ref 26.0–34.0)
MCHC: 34.5 g/dL (ref 30.0–36.0)
MCV: 95.6 fL (ref 80.0–100.0)
Platelets: 49 10*3/uL — ABNORMAL LOW (ref 150–400)
RBC: 2.7 MIL/uL — ABNORMAL LOW (ref 3.87–5.11)
RDW: 16.9 % — ABNORMAL HIGH (ref 11.5–15.5)
WBC: 2.9 10*3/uL — ABNORMAL LOW (ref 4.0–10.5)
nRBC: 0 % (ref 0.0–0.2)

## 2021-05-30 MED ORDER — POTASSIUM CHLORIDE CRYS ER 20 MEQ PO TBCR
40.0000 meq | EXTENDED_RELEASE_TABLET | ORAL | Status: AC
  Administered 2021-05-30 (×2): 40 meq via ORAL
  Filled 2021-05-30 (×2): qty 2

## 2021-05-30 MED ORDER — BISACODYL 10 MG RE SUPP
10.0000 mg | Freq: Once | RECTAL | Status: AC
  Administered 2021-05-30: 10 mg via RECTAL
  Filled 2021-05-30: qty 1

## 2021-05-30 MED ORDER — SODIUM CHLORIDE 0.9% IV SOLUTION: Freq: Once | INTRAVENOUS | Status: AC

## 2021-05-30 NOTE — Progress Notes (Signed)
Kristen Freeman   DOB:August 15, 1950   OA#:[id number]   ? 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 ?Unfortunately, due to recurrent hemoptysis, she is transferred back to ICU ?I will continue to follow daily ?We discussed the role of palliative chemotherapy ?Without chemotherapy, I do not see any chance she will improve ?However, given significant pancytopenia, I do not believe she can tolerate combination of carboplatin and paclitaxel ?After much discussion, we are in agreement to try single agent carboplatin only tomorrow ?The risk, benefits, side effects of treatment were discussed and she is in agreement to proceed ?I will make arrangement ?She will continue palliative radiation treatment for now ?  ?Hemoptysis, recurrent ?Secondary to lung metastasis ?She is undergoing palliative radiation therapy to her chest ?Continue supportive care ?As above, will start palliative chemotherapy tomorrow ?  ?IVC thrombus ?CT abdomen/pelvis shows a filling defect in the infrarenal IVC and bilateral lower extremity Doppler ultrasound confirmed DVT  ?unable to anticoagulate secondary to recent hemoptysis ?IR as well as vascular surgery have been requested for consideration of thrombectomy versus IVC filter placement, overall, deemed not a candidate for thrombectomy or IVC filter placement ?I reviewed the danger of not treating her IVC thrombus with the patient and family ?Due to her significant bleeding risk, at this point, we have no choice but to observe ?If her platelet count started to trend over 50,000, we will start and initiate anticoagulation therapy with IV heparin without bolus ?She is not stable yet today to start heparin ?Due to ongoing hemoptysis, I plan to defer anticoagulation therapy until a later date ?  ?Macrocytic anemia ?Anemia due to recent hemoptysis, transfused multiple times ?Serum iron and B12 level were within normal range ?I have started her on folic  acid supplementation ?  ?Thrombocytopenia ?Likely due to recent chemoradiation therapy in [place] as well as consumptive secondary to recent bleeding  ?Received 1 unit of platelets on 05/21/2021 due to active bleeding ?Monitor closely ?I recommend keeping platelet count at 50,000 or above. ?We discussed some of the risks, benefits, and alternatives of platelets transfusions. The patient is symptomatic from low platelet counts with bruising/bleeding/at high risk of life-threatening bleeding and the platelet count is critically low.  Some of the side-effects to be expected including risks of transfusion reactions, chills, infection, syndrome of volume overload and risk of hospitalization from various reasons and the patient is willing to proceed and went ahead to sign consent today. ?  ?Goals of care ?I have a long discussion with patient and family in regards to [place] and goals of care ?She would like to receive aggressive care as much as possible ?In the event of cardiorespiratory arrest, she would like to be resuscitated but she does not want to be maintained on life support indefinitely ?Her husband and 2 sons are her dedicated healthcare power of attorney ?  ?Discharge planning ?She is not safe for discharge due to pancytopenia, ongoing radiation, and treated thrombosis and aggressive disease ?The risk of premature discharge is risk of death ?The plan over the next few days will be to continue radiation therapy and to start chemotherapy tomorrow  ?transfuse as needed ?I am hopeful she can be discharged end of the week ? ?All questions were answered. The patient knows to call the clinic with any problems, questions or concerns. ?  ?The total time spent in the appointment was 55 minutes encounter with patients including review of chart and various tests results, discussions about  plan of care and coordination of care plan ? ?Heath Lark, MD ?05/30/2021 8:59 AM ? ?Subjective:  ?The patient is transferred out of  the ICU ?She have scant hemoptysis last night ?No other forms of bleeding ?Denies chest pain or shortness of breath ? ?Objective:  ?Vitals:  ? 05/29/21 2105 05/30/21 0422  ?BP: 119/74 126/78  ?Pulse: 99 99  ?Resp: 16 16  ?Temp: 97.9 ?F (36.6 ?C) 97.9 ?F (36.6 ?C)  ?SpO2: 97% 95%  ?  ? ?Intake/Output Summary (Last 24 hours) at 05/30/2021 0859 ?Last data filed at 05/30/2021 0119 ?Gross per 24 hour  ?Intake 720 ml  ?Output --  ?Net 720 ml  ? ? ?GENERAL:alert, no distress and comfortable ?NEURO: alert & oriented x 3 with fluent speech, no focal motor/sensory deficits ?  ?Labs:  ?Recent Labs  ?  05/21/21 ?1140 05/22/21 ?0441 05/23/21 ?0430 05/24/21 ?0500 05/30/21 ?0426  ?NA  --    < > 135 134* 137  ?K  --    < > 3.4* 3.9 3.0*  ?CL  --    < > 105 105 104  ?CO2  --    < > '24 24 28  '$ ?GLUCOSE  --    < > 125* 114* 115*  ?BUN  --    < > 7* 10 18  ?CREATININE  --    < > 0.61 0.64 0.62  ?CALCIUM  --    < > 8.4* 8.6* 8.5*  ?GFRNONAA  --    < > >60 >60 >60  ?PROT 6.7  --   --   --   --   ?ALBUMIN 3.3*  --   --   --   --   ?AST 27  --   --   --   --   ?ALT 23  --   --   --   --   ?ALKPHOS 67  --   --   --   --   ?BILITOT 0.8  --   --   --   --   ?BILIDIR 0.2  --   --   --   --   ?IBILI 0.6  --   --   --   --   ? < > = values in this interval not displayed.  ? ? ?Studies:  ?CT Angio Chest Pulmonary Embolism (PE) W or WO Contrast ? ?Result Date: 05/21/2021 ?CLINICAL DATA:  71 year old female with history of abnormal chest x-ray. Endometrial cancer. Hemoptysis. * Tracking Code: BO * EXAM: CT ANGIOGRAPHY CHEST WITH CONTRAST TECHNIQUE: Multidetector CT imaging of the chest was performed using the standard protocol during bolus administration of intravenous contrast. Multiplanar CT image reconstructions and MIPs were obtained to evaluate the vascular anatomy. RADIATION DOSE REDUCTION: This exam was performed according to the departmental dose-optimization program which includes automated exposure control, adjustment of the mA and/or kV  according to patient size and/or use of iterative reconstruction technique. CONTRAST:  107m OMNIPAQUE IOHEXOL 350 MG/ML SOLN COMPARISON:  No priors. FINDINGS: Comment: Today's study is limited by considerable patient respiratory motion. Cardiovascular: There are no central, lobar or segmental sized filling defects within the pulmonary arterial tree to suggest pulmonary embolism. Smaller distal subsegmental sized emboli can not be entirely excluded on the basis of today's examination. Heart size is normal. There is no significant pericardial fluid, thickening or pericardial calcification. There is aortic atherosclerosis, as well as atherosclerosis of the great vessels of the mediastinum and the coronary arteries, including calcified atherosclerotic plaque  in the left anterior descending coronary arteries. Left internal jugular single-lumen porta cath with tip terminating in the right atrium. Mediastinum/Nodes: There is extensive lymphadenopathy most evident in the superior mediastinum and left supraclavicular region. The largest left supraclavicular lymph nodes measure 2.2 cm in short axis on axial images 5 and 12 of series 4. The largest superior mediastinal nodal mass measures 5.1 x 2.3 cm (axial image 35 of series 4) anterior to the origin of the great vessels. High right paratracheal lymph node measuring 1.6 cm in short axis. Prominent but nonenlarged bilateral hilar lymph nodes are noted. Middle mediastinal lymphadenopathy measuring up to 2.2 cm in short axis adjacent to the descending thoracic aorta (axial image 96 of series 4). Right retrocrural lymph node (axial image 109 of series 4) measuring 1.9 cm in short axis. Esophagus is unremarkable in appearance. No axillary lymphadenopathy. Lungs/Pleura: Numerous pulmonary nodules and masses are noted throughout the lungs bilaterally. The largest confluent mass or conglomeration of masses is in the right lower lobe (axial image 90 of series 4) measuring 6.7 x 6.7  cm. Several of these lesions demonstrate surrounding ground-glass attenuation, suggesting perilesional hemorrhage. No pleural effusions. Upper Abdomen: Unremarkable. Musculoskeletal: There are no aggressive app

## 2021-05-30 NOTE — Progress Notes (Signed)
? ?PROGRESS NOTE ? ? ? ?Gennaro Africa  FAO:[id number] DOB: 1951/01/09 DOA: 05/19/2021 ?PCP: Allie Dimmer, MD ? ? ?Brief Narrative: ?71 year old female with history of endometrial cancer in 2019 and in remission until Dec '22 when she was found to have recurrence, underwent XRT and chemo completing treatment 04/25/21. She had an episode of hemoptysis 05/17/21. She sought care at ED in [place]. She was found to have thrombocytopenia and did had platelet transfusion. Other Lab - Glucose 115, INR 1.1, Hgb 8.4, WBC 4.4, Plt 59, nl Diff. CTA chest - increased pulmonary mets: multiple large nodules. ? Post-obstructive consolidation with nodule 7.6x3.2x2 cm. Bulky adenopathy.EKG - nl.  She was subsequently transferred to [facility] for further evaluation and management.   ?3.26- transferred to stepdown/icu for hemoptysis recurring- s/p bronchoscopy: showed "1. 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 Blocking entire distal airay into RML and RLL" ?Rad on hemonc and palliative team were consulted 3/27 ? ? ?Assessment and Plan: ? ?Hemoptysis ?Secondary to right bronchus intermedius tumor with associated clot. Recommendation to not pursue IR embolization, but rather consideration of radiation oncology for tumor debulking. Radiation oncology consulted and have initiated plan for radiation treatments x10. Treatment course started on 3/27. Patient developed recurrent hemoptysis on 4/1. Tranexamic acid nebulization initiated. IR re-consulted. Repeat CT angio significant for multiple areas of pulmonary hemorrhaging surrounding metastatic masses. Symptoms improved after Tranexamic acid nebulization treatments, platelets, control of cough and time. PCCM signed off on 4/3. Consideration by IR to perform embolization but deferred for life threatening hemoptysis. Patient with continued episodes of mild hemoptysis. ?-Hycodan QID ? ?Right bronchus intermedius lesion ?Noted on bronchoscopy. Radiation therapy started.  Lesion has increased since initial CT. ? ?IVC thrombus ?Noted on CT abdomen/pelvis, confirmed by venous duplex. Complicated by recent hemoptysis. IR consulted for consideration of thrombectomy/IVC filter, but recommended against both at this time with preference not to place a suprarenal IVC filter. Heparin planned, however now deferred secondary to worsening thrombocytopenia and continued hemoptysis. Anticoagulation on hold until bleeding has been better controlled. Hematology/oncology to managed this at a later date with anticoagulation if feasible. ? ?Metastatic endometrial cancer ?Lung metastasis ?Lymph node metastasis ?Patient is followed by gynecology/oncology as an outpatient for prior endometrial cancer diagnosis. Hematology/oncology consulted this admission for consideration of palliative chemotherapy starting 4/3. Palliative radiation started on 3/27 as mentioned above. Evidence of worsening metastatic disease on repeat CT chest on 4/1. ?-Medical oncology recommendations: Plan for chemotherapy on 4/5 ? ?Thrombocytopenia ?Slightly downward drift. Hematology/oncology recommending goal of >50,000. 1 unit of platelets transfused on 3/26. Platelets of 41,000 on CBC 4/1 with worsened hemoptysis, so 1 unit of platelets transfused on 4/1. Post-transfusion platelets of 58,000, now down to 49,000 on CBC today ?-Daily CBC ?-Hematology/oncology recommendations: transfusing 1 unit of platelets ? ?Anemia ?Likely chronic component in setting of metastatic cancer and resultant chronic blood loss anemia. Patient with an associated acute component secondary to hemoptysis and likely bleeding from lung mass. Patient has received 3 units of PRBC to date. Currently with ongoing hemoptysis. Hemoglobin down to 7.7 on CBC but patient developed symptoms of atrial fibrillation with RVR. 1 unit of PRBC transfused on 4/2. Post-transfusion hemoglobin of 9 > 9.9. ?-Daily CBC ? ?Pancytopenia ?Noted down-trending WBC ?-CBC with  differential in AM ? ?Paroxysmal atrial fibrillation ?Patient with a transient episode on 4/2, possibly precipitated by acute anemia. Heart back in NSR. Patient not a candidate for anticoagulation per above. ?-Continue telemetry ? ?Hypokalemia ?Recurrent ?-Potassium supplementation ?-Check magnesium ? ?  Glaucoma ?-Continue latanoprost ? ?Primary hypertension ?Hydrochlorothiazide and metoprolol initially held. Metoprolol restarted. Blood pressure stable. ?-Continue metoprolol ? ? ?DVT prophylaxis: SCDs ?Code Status:   Code Status: Full Code ?Family Communication: Son at bedside ?Disposition Plan: Continue telemetry, discharge home pending ability to start anticoagulation, stability of hemoglobin/platelets, specialist recommendations for chemotherapy ? ? ?Consultants:  ?Medical oncology ?PCCM ?Radiation oncology ?Interventional radiology ? ?Procedures:  ?Flexible bronchoscopy (05/21/2021) ? ?Antimicrobials: ?None  ? ? ?Subjective: ?Some mild hemoptysis with non-bloody sputum as well. ? ?Objective: ?BP 119/85   Pulse 94   Temp 97.9 ?F (36.6 ?C) (Oral)   Resp 18   Ht _0  (1.499 m)   Wt 55.8 kg   SpO2 98%   BMI 24.85 kg/m?  ? ?Examination: ? ?General exam: Appears calm and comfortable ?Respiratory system: Diffuse rales. Respiratory effort normal. ?Cardiovascular system: S1 & S2 heard, RRR. ?Gastrointestinal system: Abdomen is nondistended, soft and nontender. No organomegaly or masses felt. Normal bowel sounds heard. ?Central nervous system: Alert and oriented. No focal neurological deficits. ?Musculoskeletal: No edema. No calf tenderness ?Skin: No cyanosis. No rashes ?Psychiatry: Judgement and insight appear normal. Mood & affect appropriate.  ? ? ?Data Reviewed: I have personally reviewed following labs and imaging studies ? ?CBC ?Lab Results  ?Component Value Date  ? WBC 2.9 (L) 05/30/2021  ? RBC 2.70 (L) 05/30/2021  ? HGB 8.9 (L) 05/30/2021  ? HCT 25.8 (L) 05/30/2021  ? MCV 95.6 05/30/2021  ? MCH 33.0  05/30/2021  ? PLT 49 (L) 05/30/2021  ? MCHC 34.5 05/30/2021  ? RDW 16.9 (H) 05/30/2021  ? LYMPHSABS 0.4 (L) 05/23/2021  ? MONOABS 0.5 05/23/2021  ? EOSABS 0.1 05/23/2021  ? BASOSABS 0.0 05/23/2021  ? ? ? ?Last metabolic panel ?Lab Results  ?Component Value Date  ? NA 137 05/30/2021  ? K 3.0 (L) 05/30/2021  ? CL 104 05/30/2021  ? CO2 28 05/30/2021  ? BUN 18 05/30/2021  ? CREATININE 0.62 05/30/2021  ? GLUCOSE 115 (H) 05/30/2021  ? GFRNONAA >60 05/30/2021  ? CALCIUM 8.5 (L) 05/30/2021  ? PHOS 3.3 05/21/2021  ? PROT 6.7 05/21/2021  ? ALBUMIN 3.3 (L) 05/21/2021  ? BILITOT 0.8 05/21/2021  ? ALKPHOS 67 05/21/2021  ? AST 27 05/21/2021  ? ALT 23 05/21/2021  ? ANIONGAP 5 05/30/2021  ? ? ?GFR: ?Estimated Creatinine Clearance: 48.4 mL/min (by C-G formula based on SCr of 0.62 mg/dL). ? ?Recent Results (from the past 240 hour(s))  ?MRSA Next Gen by PCR, Nasal     Status: None  ? Collection Time: 05/21/21  9:02 AM  ? Specimen: Nasal Mucosa; Nasal Swab  ?Result Value Ref Range Status  ? MRSA by PCR Next Gen NOT DETECTED NOT DETECTED Final  ?  Comment: (NOTE) ?The GeneXpert MRSA Assay (FDA approved for NASAL specimens only), ?is one component of a comprehensive MRSA colonization surveillance ?program. It is not intended to diagnose MRSA infection nor to guide ?or monitor treatment for MRSA infections. ?Test performance is not FDA approved in patients less than 2 years ?old. ?Performed at [facility], [address] ?[place], [place] [address] ?  ?  ? ? ?Radiology Studies: ?No results found. ? ? ? LOS: 11 days  ? ? ?Cordelia Poche, MD ?[facility] ?05/30/2021, 11:39 AM ? ? ?If 7PM-7AM, please contact night-coverage ?[email or web address] ? ?

## 2021-05-30 NOTE — Progress Notes (Signed)
Chaplain engaged in an initial visit with Mrs. Vrooman.  Mrs. Whidby right away shared about experiencing some nausea due to her potassium medicine and forgetting to eat with it.  She was attempting at this time to put some food on her stomach.  Her kids did a great job of making sure she had some food to eat and helped her order some food from the cafeteria. Chaplain spent time giving her space to express how she has felt and how hard that has been on her body. ? ?Chaplain also checked in with Mrs. Hammontree concerning how she has felt as she has undergone multiple health crises recently.  She explained her healthcare journey and how hard it has been for her going back and forth from the ICU.  She was able to describe with clarity what needs to happen for her to be "well."  She understands her healthcare needs.  Mrs. Ackley also appears to be frustrated and tired concerning everything happening with her body as well.  ? ?Chaplain spent some time getting to know family at bedside as well.  Mrs. Bernabe has three children and two great great grandchildren.  Mrs. Wingate is also 1 of 10 children herself, being the sixth child that was born of five girls and five boys.  They are from [place], [place].   ? ?Chaplain spent time building relationship and connection, while also trying to change their perspective regarding Chaplain presence.  Family noted that they had hoped not to see a "Chaplain" because of the stigma attached concerning death and dying.  Chaplain explained that she was there to offer support to them and that Chaplains are called in a variety of cases.  Chaplain offered a compassionate presence, reflective listening, and support.  Chaplain could assess that Mrs. Slawson has a very protective and caring family.  ? ? ? 05/30/21 1600  ?Clinical Encounter Type  ?Visited With Patient and family together  ?Visit Type Spiritual support;Initial  ?Referral From Chaplain  ?Consult/Referral To Chaplain  ?Stress Factors   ?Patient Stress Factors Health changes;Major life changes  ? ? ?

## 2021-05-31 ENCOUNTER — Ambulatory Visit
Admit: 2021-05-31 | Discharge: 2021-05-31 | Disposition: A | Discharge status: Home / Self Care | Payer: Medicare Other | Attending: Radiation Oncology | Admitting: Radiation Oncology

## 2021-05-31 DIAGNOSIS — C541 Malignant neoplasm of endometrium: Secondary | ICD-10-CM

## 2021-05-31 DIAGNOSIS — K5909 Other constipation: Secondary | ICD-10-CM | POA: Insufficient documentation

## 2021-05-31 DIAGNOSIS — R042 Hemoptysis: Secondary | ICD-10-CM | POA: Diagnosis not present

## 2021-05-31 LAB — PREPARE PLATELET PHERESIS: Unit division: 0

## 2021-05-31 LAB — BPAM PLATELET PHERESIS
Blood Product Expiration Date: 202304072359
ISSUE DATE / TIME: 202304041017
Unit Type and Rh: 6200

## 2021-05-31 LAB — CBC WITH DIFFERENTIAL/PLATELET
Abs Immature Granulocytes: 0.01 10*3/uL (ref 0.00–0.07)
Basophils Absolute: 0 10*3/uL (ref 0.0–0.1)
Basophils Relative: 0 %
Eosinophils Absolute: 0.1 10*3/uL (ref 0.0–0.5)
Eosinophils Relative: 2 %
HCT: 26.3 % — ABNORMAL LOW (ref 36.0–46.0)
Hemoglobin: 8.8 g/dL — ABNORMAL LOW (ref 12.0–15.0)
Immature Granulocytes: 1 %
Lymphocytes Relative: 3 %
Lymphs Abs: 0.1 10*3/uL — ABNORMAL LOW (ref 0.7–4.0)
MCH: 32.7 pg (ref 26.0–34.0)
MCHC: 33.5 g/dL (ref 30.0–36.0)
MCV: 97.8 fL (ref 80.0–100.0)
Monocytes Absolute: 0.2 10*3/uL (ref 0.1–1.0)
Monocytes Relative: 9 %
Neutro Abs: 1.8 10*3/uL (ref 1.7–7.7)
Neutrophils Relative %: 85 %
Platelets: 76 10*3/uL — ABNORMAL LOW (ref 150–400)
RBC: 2.69 MIL/uL — ABNORMAL LOW (ref 3.87–5.11)
RDW: 16.5 % — ABNORMAL HIGH (ref 11.5–15.5)
WBC: 2.1 10*3/uL — ABNORMAL LOW (ref 4.0–10.5)
nRBC: 0.9 % — ABNORMAL HIGH (ref 0.0–0.2)

## 2021-05-31 LAB — BASIC METABOLIC PANEL
Anion gap: 7 (ref 5–15)
BUN: 12 mg/dL (ref 8–23)
CO2: 26 mmol/L (ref 22–32)
Calcium: 8.7 mg/dL — ABNORMAL LOW (ref 8.9–10.3)
Chloride: 102 mmol/L (ref 98–111)
Creatinine, Ser: 0.54 mg/dL (ref 0.44–1.00)
GFR, Estimated: >60 mL/min (ref >60)
Glucose, Bld: 121 mg/dL — ABNORMAL HIGH (ref 70–99)
Potassium: 3.8 mmol/L (ref 3.5–5.1)
Sodium: 135 mmol/L (ref 135–145)

## 2021-05-31 LAB — MAGNESIUM: Magnesium: 2.2 mg/dL (ref 1.7–2.4)

## 2021-05-31 MED ORDER — SODIUM CHLORIDE 0.9 % IV SOLN
10.0000 mg | Freq: Once | INTRAVENOUS | Status: AC
  Administered 2021-05-31: 10 mg via INTRAVENOUS
  Filled 2021-05-31: qty 1

## 2021-05-31 MED ORDER — SODIUM CHLORIDE 0.9 % IV SOLN
350.0000 mg | Freq: Once | INTRAVENOUS | Status: AC
  Administered 2021-05-31: 350 mg via INTRAVENOUS
  Filled 2021-05-31: qty 35

## 2021-05-31 MED ORDER — FAMOTIDINE IN NACL 20-0.9 MG/50ML-% IV SOLN
20.0000 mg | Freq: Once | INTRAVENOUS | Status: AC
  Administered 2021-05-31: 20 mg via INTRAVENOUS
  Filled 2021-05-31: qty 50

## 2021-05-31 MED ORDER — POLYETHYLENE GLYCOL 3350 17 G PO PACK
17.0000 g | PACK | Freq: Every day | ORAL | Status: DC
  Administered 2021-06-01 – 2021-06-02 (×2): 17 g via ORAL
  Filled 2021-05-31 (×2): qty 1

## 2021-05-31 MED ORDER — PROCHLORPERAZINE MALEATE 10 MG PO TABS: 5.0000 mg | ORAL_TABLET | Freq: Four times a day (QID) | ORAL | Status: DC | PRN

## 2021-05-31 MED ORDER — SODIUM CHLORIDE 0.9 % IV SOLN: Freq: Once | INTRAVENOUS | Status: AC

## 2021-05-31 MED ORDER — ONDANSETRON HCL 4 MG/2ML IJ SOLN: 4.0000 mg | Freq: Four times a day (QID) | INTRAMUSCULAR | Status: DC | PRN

## 2021-05-31 MED ORDER — DIPHENHYDRAMINE HCL 50 MG/ML IJ SOLN
25.0000 mg | Freq: Once | INTRAMUSCULAR | Status: AC
  Administered 2021-05-31: 25 mg via INTRAVENOUS
  Filled 2021-05-31: qty 1

## 2021-05-31 MED ORDER — COLD PACK MISC ONCOLOGY
1.0000 | Freq: Once | Status: AC | PRN
  Filled 2021-05-31: qty 1

## 2021-05-31 MED ORDER — PALONOSETRON HCL INJECTION 0.25 MG/5ML
0.2500 mg | Freq: Once | INTRAVENOUS | Status: AC
  Administered 2021-05-31: 0.25 mg via INTRAVENOUS
  Filled 2021-05-31: qty 5

## 2021-05-31 MED ORDER — SODIUM CHLORIDE 0.9 % IV SOLN
150.0000 mg | Freq: Once | INTRAVENOUS | Status: AC
  Administered 2021-05-31: 150 mg via INTRAVENOUS
  Filled 2021-05-31: qty 5

## 2021-05-31 NOTE — Assessment & Plan Note (Addendum)
S/p carboplatin 4/5 ?Radiation therapy, started 3/27-4/7 ?Planning for discharge today with close outpatient follow up with Dr. Alvy Bimler ?See 05/23/2021 oncology note for detailed oncologic history - dx stage Ib, high grade endometrial cancer s/p robotic assisted total laparoscopic hysterectomy, bilateral salpingo oophorectomy, lphatic mapping with bilateral pelvic and periaortic sentinel LN excision, omental bx and mini lapartomy on 02/13/2018 ?CT C/Neelie Welshans/P 3/26 with bulky adenopathy in L retroperitoneum along aorta and extending into peritoneal space, peritoneal nodularity in L adnexal region and retrocrural space additionally, lobar consolidation mass in R lower lobe.  Widespread metastatic disease to chest, including numerous large pulmonary nodules and masses, many of which demonstrate surrounding ground glass attenuation suggestive of perilesional alveolar hemorrhage, extensive mediastinal and L supraclavicular LAD. ?

## 2021-05-31 NOTE — Assessment & Plan Note (Addendum)
IR recommended against thrombectomy/IVC filter at this time ?3/28 [place] with age indeterminate DVT of R popliteal vein, acute DVT involving L peroneal veins ?Consider anticoagulation/heparin per oncology, holding off at this time in setting of continued hemoptysis at this time planning for outpatient follow up with oncology due to ongoing hemoptysis, not currently Karem Farha candidate for anticoagualation ?

## 2021-05-31 NOTE — Assessment & Plan Note (Signed)
follow

## 2021-05-31 NOTE — Assessment & Plan Note (Signed)
Will continue to monitor.

## 2021-05-31 NOTE — Progress Notes (Signed)
Kristen Freeman   DOB:Mar 09, 1950   EG#:[id number]   ? ?ASSESSMENT & PLAN:  ? 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 ?Unfortunately, due to recurrent hemoptysis, she is transferred back to ICU and then back to regular floor ?I will continue to follow daily ?We discussed the role of palliative chemotherapy ?Without chemotherapy, I do not see any chance she will improve ?However, given significant pancytopenia, I do not believe she can tolerate combination of carboplatin and paclitaxel ?After much discussion, we are in agreement to try single agent carboplatin only, start today ?The risk, benefits, side effects of treatment were discussed and she is in agreement to proceed ?She will continue palliative radiation treatment for now ?  ?Hemoptysis, recurrent ?Secondary to lung metastasis ?She is undergoing palliative radiation therapy to her chest ?Continue supportive care ?As above, will start palliative chemotherapy today ?  ?IVC thrombus ?CT abdomen/pelvis shows a filling defect in the infrarenal IVC and bilateral lower extremity Doppler ultrasound confirmed DVT  ?unable to anticoagulate secondary to recent hemoptysis ?IR as well as vascular surgery have been requested for consideration of thrombectomy versus IVC filter placement, overall, deemed not a candidate for thrombectomy or IVC filter placement ?I reviewed the danger of not treating her IVC thrombus with the patient and family ?Due to her significant bleeding risk, at this point, we have no choice but to observe ?If her platelet count started to trend over 50,000, we will start and initiate anticoagulation therapy with IV heparin without bolus.  Her platelet count is better today likely due to transfusion yesterday ?She is not stable yet today to start heparin ?Due to ongoing hemoptysis, I plan to defer anticoagulation therapy until a later date ?  ?Macrocytic anemia ?Anemia due to recent hemoptysis, transfused multiple  times ?Serum iron and B12 level were within normal range ?I have started her on folic acid supplementation ?  ?Thrombocytopenia ?Likely due to recent chemoradiation therapy in [place] as well as consumptive secondary to recent bleeding  ?Received 1 unit of platelets on 05/21/2021 due to active bleeding ?Monitor closely ?I recommend keeping platelet count at 50,000 or above, improved due to platelet transfusion on 05/30/2021 ? ?Goals of care ?I have a long discussion with patient and family in regards to [place] and goals of care ?She would like to receive aggressive care as much as possible ?In the event of cardiorespiratory arrest, she would like to be resuscitated but she does not want to be maintained on life support indefinitely ?Her husband and 2 sons are her dedicated healthcare power of attorney ?  ?Discharge planning ?She is not safe for discharge due to pancytopenia, ongoing radiation, and treated thrombosis and aggressive disease ?The risk of premature discharge is risk of death ?The plan over the next few days will be to continue radiation therapy and to start chemotherapy today  ?transfuse as needed ?I am hopeful she can be discharged end of the week ?  ?All questions were answered. The patient knows to call the clinic with any problems, questions or concerns. ?  ?The total time spent in the appointment was 55 minutes encounter with patients including review of chart and various tests results, discussions about plan of care and coordination of care plan ? ?Heath Lark, MD ?05/31/2021 10:17 AM ? ?Subjective:  ?She is seen this morning.  Son is by the bedside. ?She had recurrent hemoptysis again this morning.  She received the blood clot/tissue paper for me to see ?She have no hematuria  or hematochezia ?Denies chest pain or shortness of breath ?She is keen to start chemotherapy today ? ?Objective:  ?Vitals:  ? 05/30/21 2012 05/31/21 0511  ?BP: (!) 143/91 126/88  ?Pulse: (!) 105 (!) 109  ?Resp: 18   ?Temp:  98.4 ?F (36.9 ?C) 98.2 ?F (36.8 ?C)  ?SpO2: 96% 97%  ?  ? ?Intake/Output Summary (Last 24 hours) at 05/31/2021 1017 ?Last data filed at 05/30/2021 2115 ?Gross per 24 hour  ?Intake 309 ml  ?Output --  ?Net 309 ml  ? ? ?GENERAL:alert, no distress and comfortable ?NEURO: alert & oriented x 3 with fluent speech, no focal motor/sensory deficits ?  ?Labs:  ?Recent Labs  ?  05/21/21 ?1140 05/22/21 ?0441 05/24/21 ?0500 05/30/21 ?0426 05/31/21 ?0512  ?NA  --    < > 134* 137 135  ?K  --    < > 3.9 3.0* 3.8  ?CL  --    < > 105 104 102  ?CO2  --    < > '24 28 26  '$ ?GLUCOSE  --    < > 114* 115* 121*  ?BUN  --    < > '10 18 12  '$ ?CREATININE  --    < > 0.64 0.62 0.54  ?CALCIUM  --    < > 8.6* 8.5* 8.7*  ?GFRNONAA  --    < > >60 >60 >60  ?PROT 6.7  --   --   --   --   ?ALBUMIN 3.3*  --   --   --   --   ?AST 27  --   --   --   --   ?ALT 23  --   --   --   --   ?ALKPHOS 67  --   --   --   --   ?BILITOT 0.8  --   --   --   --   ?BILIDIR 0.2  --   --   --   --   ?IBILI 0.6  --   --   --   --   ? < > = values in this interval not displayed.  ? ? ?Studies:  ?CT Angio Chest Pulmonary Embolism (PE) W or WO Contrast ? ?Result Date: 05/21/2021 ?CLINICAL DATA:  71 year old female with history of abnormal chest x-ray. Endometrial cancer. Hemoptysis. * Tracking Code: BO * EXAM: CT ANGIOGRAPHY CHEST WITH CONTRAST TECHNIQUE: Multidetector CT imaging of the chest was performed using the standard protocol during bolus administration of intravenous contrast. Multiplanar CT image reconstructions and MIPs were obtained to evaluate the vascular anatomy. RADIATION DOSE REDUCTION: This exam was performed according to the departmental dose-optimization program which includes automated exposure control, adjustment of the mA and/or kV according to patient size and/or use of iterative reconstruction technique. CONTRAST:  13m OMNIPAQUE IOHEXOL 350 MG/ML SOLN COMPARISON:  No priors. FINDINGS: Comment: Today's study is limited by considerable patient respiratory  motion. Cardiovascular: There are no central, lobar or segmental sized filling defects within the pulmonary arterial tree to suggest pulmonary embolism. Smaller distal subsegmental sized emboli can not be entirely excluded on the basis of today's examination. Heart size is normal. There is no significant pericardial fluid, thickening or pericardial calcification. There is aortic atherosclerosis, as well as atherosclerosis of the great vessels of the mediastinum and the coronary arteries, including calcified atherosclerotic plaque in the left anterior descending coronary arteries. Left internal jugular single-lumen porta cath with tip terminating in the right atrium. Mediastinum/Nodes: There is extensive lymphadenopathy  most evident in the superior mediastinum and left supraclavicular region. The largest left supraclavicular lymph nodes measure 2.2 cm in short axis on axial images 5 and 12 of series 4. The largest superior mediastinal nodal mass measures 5.1 x 2.3 cm (axial image 35 of series 4) anterior to the origin of the great vessels. High right paratracheal lymph node measuring 1.6 cm in short axis. Prominent but nonenlarged bilateral hilar lymph nodes are noted. Middle mediastinal lymphadenopathy measuring up to 2.2 cm in short axis adjacent to the descending thoracic aorta (axial image 96 of series 4). Right retrocrural lymph node (axial image 109 of series 4) measuring 1.9 cm in short axis. Esophagus is unremarkable in appearance. No axillary lymphadenopathy. Lungs/Pleura: Numerous pulmonary nodules and masses are noted throughout the lungs bilaterally. The largest confluent mass or conglomeration of masses is in the right lower lobe (axial image 90 of series 4) measuring 6.7 x 6.7 cm. Several of these lesions demonstrate surrounding ground-glass attenuation, suggesting perilesional hemorrhage. No pleural effusions. Upper Abdomen: Unremarkable. Musculoskeletal: There are no aggressive appearing lytic or  blastic lesions noted in the visualized portions of the skeleton. Review of the MIP images confirms the above findings. IMPRESSION: 1. Despite the mild limitations of today's examination, there is no evidence to sugge

## 2021-05-31 NOTE — Assessment & Plan Note (Signed)
Hb seens relatively stable today, will follow ?Labs c/w AOCD. Normal B12. Getting folic acid supplementation. ?

## 2021-05-31 NOTE — Assessment & Plan Note (Addendum)
Stable at this time ?Transfuse for goal >50,000 ?

## 2021-05-31 NOTE — Progress Notes (Signed)
Ok to proceed with Carboplatin treatment today with elevated HR.   ? ?Acquanetta Belling, [place], BCPS, BCOP ?05/31/2021 ?8:48 AM ? ?

## 2021-05-31 NOTE — Progress Notes (Signed)
?PROGRESS NOTE ? ? ? ?Kristen Freeman  GUY:[id number] DOB: October 19, 1950 DOA: 05/19/2021 ?PCP: Allie Dimmer, MD  ?No chief complaint on file. ? ? ?Brief Narrative:  ?71 year old female with history of endometrial cancer in 2019 and in remission until Dec '22 when she was found to have recurrence, underwent XRT and chemo completing treatment 04/25/21. She had an episode of hemoptysis 05/17/21. She sought care at ED in [place]. She was found to have thrombocytopenia and did had platelet transfusion. Other Lab - Glucose 115, INR 1.1, Hgb 8.4, WBC 4.4, Plt 59, nl Diff. CTA chest - increased pulmonary mets: multiple large nodules. ? Post-obstructive consolidation with nodule 7.6x3.2x2 cm. Bulky adenopathy.EKG - nl.  She was subsequently transferred to [facility] for further evaluation and management.   ?3.26- transferred to stepdown/icu for hemoptysis recurring- s/p bronchoscopy: showed "1. 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 Blocking entire distal airay into RML and RLL" ?Rad on hemonc and palliative team were consulted 3/27  ? ? ?Assessment & Plan: ?  ?Principal Problem: ?  Hemoptysis ?Active Problems: ?  Malignant neoplasm of endometrium metastatic to lung [facility]) ?  Malignant neoplasm of endometrium metastatic to intra-abdominal lymph node ([place]) ?  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  Thrombocytopenia ([place]) ?  Acute blood loss anemia ?  Pancytopenia ([place]) ?  Hypokalemia ?  Glaucoma ?  HTN (hypertension) ?  HLD (hyperlipidemia) ?  Normocytic anemia ?  Iron deficiency anemia ?  Malignant neoplasm metastatic to lung [facility]) ?  Malignant neoplasm metastatic to both lungs [facility]) ?  Other constipation ? ? 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 ?Plan at this time for radiation oncology for tumor debulking ?Radiation started 3/27 ?Appreciate rad oncology ?S/p flexible bronchoscopy 3/26 -> R bronchus intermedius lesion  ?Recurrent hemoptysis developed 4/1, now  improved ?CT 4/1 with right lower lobe mass increased in size, bilateral pulm nodules increased in size, mediastinal and L supraclavicular LAD increased in size ?PCCM signed off 4/3 - recommended continuing to hold anticoagulation given severity of prior bleeds - consider IR pulm angiogram with embolization to area of RLL if rebleeds  - lesion in bronchus intermedius likely inspissated clot ?She continues to have some intermittent hemoptysis, but this appears mostly stable ?IR embolization deferred for life threatening hemoptysis ? ?Malignant neoplasm of endometrium metastatic to lung [facility]) ?Planning for chemotherapy today, carboplatin only ?Radiation therapy, started 3/27, per rad onc as above ?See 05/23/2021 oncology note for detailed oncologic history - dx stage Ib, high grade endometrial cancer s/p robotic assisted total laparoscopic hysterectomy, bilateral salpingo oophorectomy, lphatic mapping with bilateral pelvic and periaortic sentinel LN excision, omental bx and mini lapartomy on 02/13/2018 ?CT C/Raine Blodgett/P 3/26 with bulky adenopathy in L retroperitoneum along aorta and extending into peritoneal space, peritoneal nodularity in L adnexal region and retrocrural space additionally, lobar consolidation mass in R lower lobe.  Widespread metastatic disease to chest, including numerous large pulmonary nodules and masses, many of which demonstrate surrounding ground glass attenuation suggestive of perilesional alveolar hemorrhage, extensive mediastinal and L supraclavicular LAD. ? 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 ?Thrombocytopenia ([place]) ?Follow, improved today ?Transfuse for goal >50,000 ? ?Acute blood loss anemia ?Hb seens relatively stable today, will follow ?Labs c/w AOCD. Normal B12. Getting folic acid supplementation. ? ?Pancytopenia ([place]) ?Will continue to  monitor ? ?Hypokalemia ?follow ? ?Glaucoma ?May  use eye drops from home. ? ?HTN (hypertension) ?metoprolol ? ?HLD (hyperlipidemia) ?atorva on hold ? ? ?DVT prophylaxis: SCD ?Code Status: full ?Family Communication: son at bedside ?Disposition:  ? ?Status is: Inpatient ?Remains inpatient appropriate because: need for continued chemotherapy, monitoring of pancytopenia, resolution of hematemesis ?  ?Consultants:  ?Heme onc ?IR ?Vascular ?pulmonology ? ?Procedures:  ?Flexible bronch 3/26 ? ?LE [place] ?Summary:  ?BILATERAL:  ?- No evidence of superficial venous thrombosis in the lower extremities,  ?bilaterally.  ?-No evidence of popliteal cyst, bilaterally.  ?RIGHT:  ?- Findings consistent with age indeterminate deep vein thrombosis  ?involving the right popliteal vein.  ?   ?LEFT:  ?- Findings consistent with acute deep vein thrombosis involving the left  ?peroneal veins.  ?   ? ?Antimicrobials:  ?Anti-infectives (From admission, onward)  ? ? None  ? ?  ? ? ?Subjective: ?No new complaints ?Noted hemoptysis this morning - collected in basin ? ?Objective: ?Vitals:  ? 05/30/21 1435 05/30/21 2012 05/31/21 0511 05/31/21 1422  ?BP: 139/90 (!) 143/91 126/88 123/78  ?Pulse: (!) 103 (!) 105 (!) 109 87  ?Resp:  18  16  ?Temp: 98.4 ?F (36.9 ?C) 98.4 ?F (36.9 ?C) 98.2 ?F (36.8 ?C) 97.9 ?F (36.6 ?C)  ?TempSrc: Oral Oral Oral Oral  ?SpO2: 96% 96% 97% 99%  ?Weight:      ?Height:      ? ? ?Intake/Output Summary (Last 24 hours) at 05/31/2021 1557 ?Last data filed at 05/31/2021 1116 ?Gross per 24 hour  ?Intake 130 ml  ?Output --  ?Net 130 ml  ? ?Filed Weights  ? 05/22/21 0800 05/24/21 2229  ?Weight: 55.9 kg 55.8 kg  ? ? ?Examination: ? ?General exam: Appears calm and comfortable  ?Respiratory system: unlabored - basin noted maybe 2-3 table spoons of dried blood/mucus ?Cardiovascular system: RRR ?Gastrointestinal system: Abdomen is nondistended, soft and nontender ?Central nervous system: Alert and oriented. No focal neurological  deficits. ?Skin: No rashes, lesions or ulcers ?Psychiatry: Judgement and insight appear normal. Mood & affect appropriate.  ? ? ? ?Data Reviewed: I have personally reviewed following labs and imaging studies ? ?CBC: ?Recent Labs  ?Lab 05/27/21 ?1610 05/27/21 ?1327 05/28/21 ?0600 05/28/21 ?1418 05/28/21 ?2015 05/29/21 ?9604 05/30/21 ?5409 05/31/21 ?0512  ?WBC 4.4  --  4.4  --   --  3.7* 2.9* 2.1*  ?NEUTROABS  --   --   --   --   --   --   --  1.8  ?HGB 9.6*   < > 7.7* 7.7* 9.0* 9.9* 8.9* 8.8*  ?HCT 27.5*   < > 23.0* 22.5* 26.2* 28.7* 25.8* 26.3*  ?MCV 96.2  --  97.9  --   --  95.0 95.6 97.8  ?PLT 41*  --  58*  --   --  54* 49* 76*  ? < > = values in this interval not displayed.  ? ? ?Basic Metabolic Panel: ?Recent Labs  ?Lab 05/30/21 ?0426 05/31/21 ?0512  ?NA 137 135  ?K 3.0* 3.8  ?CL 104 102  ?CO2 28 26  ?GLUCOSE 115* 121*  ?BUN 18 12  ?CREATININE 0.62 0.54  ?CALCIUM 8.5* 8.7*  ?MG  --  2.2  ? ? ?GFR: ?Estimated Creatinine Clearance: 48.4 mL/min (by C-G formula based on SCr of 0.54 mg/dL). ? ?Liver Function Tests: ?No results for input(s): AST, ALT, ALKPHOS, BILITOT, PROT, ALBUMIN in the last 168 hours. ? ?CBG: ?No results for input(s): GLUCAP in the last 168 hours. ? ? ?No results  found for this or any previous visit (from the past 240 hour(s)).  ? ? ? ? ? ?Radiology Studies: ?No results found. ? ? ? ? ? ?Scheduled Meds: ? Chlorhexidine Gluconate Cloth  6 each Topical Daily  ? chlorpheniramine-HYDROcodone  5 mL Oral Q6H  ? famotidine  20 mg Oral Daily  ? folic acid  1 mg Oral Daily  ? latanoprost  1 drop Both Eyes QHS  ? metoprolol tartrate  50 mg Oral BID  ? [START ON 06/01/2021] polyethylene glycol  17 g Oral Daily  ? senna  1 tablet Oral BID  ? sodium chloride flush  10-40 mL Intracatheter Q12H  ? sodium chloride flush  3 mL Intravenous Q12H  ? ?Continuous Infusions: ? sodium chloride Stopped (05/28/21 1846)  ? ? ? LOS: 12 days  ? ? ?Time spent: over 30 min ? ? ? ?Fayrene Helper, MD ?[facility] ? ? ?To  contact the attending provider between 7A-7P or the covering provider during after hours 7P-7A, please log into the web site [email or web address] and access using universal [place] password for that web site. If you do not have

## 2021-05-31 NOTE — Care Management Important Message (Signed)
Important Message ? ?Patient Details IM Letter placed in Patients room. ?Name: Kristen Freeman ?MRN: [id number] ?Date of Birth: [birth year] ? ? ?Medicare Important Message Given:  Yes ? ? ? ? ?Kerin Salen ?05/31/2021, 10:05 AM ?

## 2021-06-01 ENCOUNTER — Encounter: Payer: Self-pay | Admitting: Hematology and Oncology

## 2021-06-01 ENCOUNTER — Ambulatory Visit
Admit: 2021-06-01 | Discharge: 2021-06-01 | Disposition: A | Discharge status: Home / Self Care | Payer: Medicare Other | Attending: Radiation Oncology | Admitting: Radiation Oncology

## 2021-06-01 DIAGNOSIS — R042 Hemoptysis: Secondary | ICD-10-CM | POA: Diagnosis not present

## 2021-06-01 LAB — CBC WITH DIFFERENTIAL/PLATELET
Abs Immature Granulocytes: 0.02 10*3/uL (ref 0.00–0.07)
Basophils Absolute: 0 10*3/uL (ref 0.0–0.1)
Basophils Relative: 0 %
Eosinophils Absolute: 0 10*3/uL (ref 0.0–0.5)
Eosinophils Relative: 0 %
HCT: 25.5 % — ABNORMAL LOW (ref 36.0–46.0)
Hemoglobin: 8.5 g/dL — ABNORMAL LOW (ref 12.0–15.0)
Immature Granulocytes: 1 %
Lymphocytes Relative: 3 %
Lymphs Abs: 0.1 10*3/uL — ABNORMAL LOW (ref 0.7–4.0)
MCH: 32.3 pg (ref 26.0–34.0)
MCHC: 33.3 g/dL (ref 30.0–36.0)
MCV: 97 fL (ref 80.0–100.0)
Monocytes Absolute: 0.2 10*3/uL (ref 0.1–1.0)
Monocytes Relative: 7 %
Neutro Abs: 2.6 10*3/uL (ref 1.7–7.7)
Neutrophils Relative %: 89 %
Platelets: 73 10*3/uL — ABNORMAL LOW (ref 150–400)
RBC: 2.63 MIL/uL — ABNORMAL LOW (ref 3.87–5.11)
RDW: 15.8 % — ABNORMAL HIGH (ref 11.5–15.5)
WBC: 2.9 10*3/uL — ABNORMAL LOW (ref 4.0–10.5)
nRBC: 0 % (ref 0.0–0.2)

## 2021-06-01 LAB — COMPREHENSIVE METABOLIC PANEL
ALT: 26 U/L (ref 0–44)
AST: 29 U/L (ref 15–41)
Albumin: 2.8 g/dL — ABNORMAL LOW (ref 3.5–5.0)
Alkaline Phosphatase: 78 U/L (ref 38–126)
Anion gap: 7 (ref 5–15)
BUN: 13 mg/dL (ref 8–23)
CO2: 24 mmol/L (ref 22–32)
Calcium: 8.8 mg/dL — ABNORMAL LOW (ref 8.9–10.3)
Chloride: 105 mmol/L (ref 98–111)
Creatinine, Ser: 0.46 mg/dL (ref 0.44–1.00)
GFR, Estimated: >60 mL/min (ref >60)
Glucose, Bld: 163 mg/dL — ABNORMAL HIGH (ref 70–99)
Potassium: 4.1 mmol/L (ref 3.5–5.1)
Sodium: 136 mmol/L (ref 135–145)
Total Bilirubin: 1.3 mg/dL — ABNORMAL HIGH (ref 0.3–1.2)
Total Protein: 6.3 g/dL — ABNORMAL LOW (ref 6.5–8.1)

## 2021-06-01 LAB — MAGNESIUM: Magnesium: 2.3 mg/dL (ref 1.7–2.4)

## 2021-06-01 LAB — PHOSPHORUS: Phosphorus: 3.4 mg/dL (ref 2.5–4.6)

## 2021-06-01 MED ORDER — ALUM & MAG HYDROXIDE-SIMETH 200-200-20 MG/5ML PO SUSP
30.0000 mL | ORAL | Status: DC | PRN
  Administered 2021-06-01: 30 mL via ORAL
  Filled 2021-06-01: qty 30

## 2021-06-01 NOTE — Progress Notes (Signed)
Kristen Freeman   DOB:March 12, 1950   FT#:[id number]   ? 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  ?Hemoptysis, recurrent ?Secondary to lung metastasis ?She is undergoing palliative radiation therapy to her chest ?Continue supportive care ?  ?IVC thrombus ?CT abdomen/pelvis shows a filling defect in the infrarenal IVC and bilateral lower extremity Doppler ultrasound confirmed DVT, unable to anticoagulate secondary to recent hemoptysis ?IR as well as vascular surgery have been requested for consideration of thrombectomy versus IVC filter placement, overall, deemed not a candidate for thrombectomy or IVC filter placement ?I reviewed the danger of not treating her IVC thrombus with the patient and family ?Due to her significant bleeding risk, at this point, we have no choice but to observe ?If her platelet count started to trend over 50,000, we will start and initiate anticoagulation therapy with IV heparin without bolus.  Her platelet count is better today likely due to transfusion on 4/4 ?She is not stable yet today to start heparin due to recurrent hemoptysis ?Due to ongoing hemoptysis, I plan to defer anticoagulation therapy until a later date ?  ?Macrocytic anemia ?Anemia due to recent hemoptysis, transfused multiple times ?Serum iron and B12 level were within normal range ?I have started her on folic acid supplementation ?   ?Thrombocytopenia ?Likely due to recent chemoradiation therapy in [place] as well as consumptive secondary to recent bleeding  ?Received 1 unit of platelets on 05/21/2021 due to active bleeding ?Monitor closely ?I recommend keeping platelet count at 50,000 or above, improved due to platelet transfusion on 05/30/2021 ?  ?Goals of care ?I have a long discussion with patient and family in regards to [place] and goals of care ?She would like to receive aggressive care as much as possible ?In the event of cardiorespiratory arrest, she would like to be resuscitated but she does not want to be maintained on life support indefinitely ?Her husband and 2 sons are her dedicated healthcare power of attorney ?  ?Discharge planning ?She is not safe for discharge due to pancytopenia, ongoing radiation, and treated thrombosis and aggressive disease ?The risk of premature discharge is risk of death ?The plan over the next few days will be to continue radiation therapy  ?transfuse as needed ?I am hopeful she can be discharged end of the week ?All questions were answered. The patient knows to call the clinic with any problems, questions or concerns. ?  ?The total time spent in the appointment was 30 minutes encounter with patients including review of chart and various tests results, discussions about plan of care and coordination of care plan ? ?Heath Lark, MD ?06/01/2021 9:30 AM ? ?Subjective:  ?She felt good this morning.  Tolerated chemotherapy well.  Has slight nausea but resolved ?Denies constipation ?She continues to have recurrent hemoptysis on a daily basis, last episode just this morning ? ?Objective:  ?Vitals:  ? 05/31/21 2050 06/01/21 0411  ?BP: (!) 158/98 (!) 141/88  ?Pulse: (!) 110 89  ?Resp: 18 18  ?  Temp: (!) 97.5 ?F (36.4 ?C) 97.9 ?F (36.6 ?C)  ?SpO2: 99% 98%  ?  ? ?Intake/Output Summary (Last 24 hours) at 06/01/2021 0930 ?Last data filed at 05/31/2021 1450 ?Gross per 24 hour  ?Intake 360 ml  ?Output --  ?Net 360 ml   ? ? ?GENERAL:alert, no distress and comfortable ?NEURO: alert & oriented x 3 with fluent speech, no focal motor/sensory deficits ?  ?Labs:  ?Recent Labs  ?  05/21/21 ?1140 05/22/21 ?0441 05/30/21 ?0426 05/31/21 ?0092 06/01/21 ?0417  ?NA  --    < > 137 135 136  ?K  --    < > 3.0* 3.8 4.1  ?CL  --    < > 104 102 105  ?CO2  --    < > '28 26 24  '$ ?GLUCOSE  --    < > 115* 121* 163*  ?BUN  --    < > '18 12 13  '$ ?CREATININE  --    < > 0.62 0.54 0.46  ?CALCIUM  --    < > 8.5* 8.7* 8.8*  ?GFRNONAA  --    < > >60 >60 >60  ?PROT 6.7  --   --   --  6.3*  ?ALBUMIN 3.3*  --   --   --  2.8*  ?AST 27  --   --   --  29  ?ALT 23  --   --   --  26  ?ALKPHOS 67  --   --   --  78  ?BILITOT 0.8  --   --   --  1.3*  ?BILIDIR 0.2  --   --   --   --   ?IBILI 0.6  --   --   --   --   ? < > = values in this interval not displayed.  ? ? ?Studies:  ?CT Angio Chest Pulmonary Embolism (PE) W or WO Contrast ? ?Result Date: 05/21/2021 ?CLINICAL DATA:  71 year old female with history of abnormal chest x-ray. Endometrial cancer. Hemoptysis. * Tracking Code: BO * EXAM: CT ANGIOGRAPHY CHEST WITH CONTRAST TECHNIQUE: Multidetector CT imaging of the chest was performed using the standard protocol during bolus administration of intravenous contrast. Multiplanar CT image reconstructions and MIPs were obtained to evaluate the vascular anatomy. RADIATION DOSE REDUCTION: This exam was performed according to the departmental dose-optimization program which includes automated exposure control, adjustment of the mA and/or kV according to patient size and/or use of iterative reconstruction technique. CONTRAST:  91m OMNIPAQUE IOHEXOL 350 MG/ML SOLN COMPARISON:  No priors. FINDINGS: Comment: Today's study is limited by considerable patient respiratory motion. Cardiovascular: There are no central, lobar or segmental sized filling defects within the pulmonary arterial tree to suggest pulmonary embolism. Smaller distal subsegmental sized emboli can not be entirely  excluded on the basis of today's examination. Heart size is normal. There is no significant pericardial fluid, thickening or pericardial calcification. There is aortic atherosclerosis, as well as atherosclerosis of the great vessels of the mediastinum and the coronary arteries, including calcified atherosclerotic plaque in the left anterior descending coronary arteries. Left internal jugular single-lumen porta cath with tip terminating in the right atrium. Mediastinum/Nodes: There is extensive lymphadenopathy most evident in the superior mediastinum and left supraclavicular region. The largest left supraclavicular lymph nodes measure 2.2 cm in short axis on axial images 5 and 12 of series 4. The largest superior mediastinal nodal mass measures 5.1 x 2.3 cm (axial image 35 of series 4) anterior to the origin of  the great vessels. High right paratracheal lymph node measuring 1.6 cm in short axis. Prominent but nonenlarged bilateral hilar lymph nodes are noted. Middle mediastinal lymphadenopathy measuring up to 2.2 cm in short axis adjacent to the descending thoracic aorta (axial image 96 of series 4). Right retrocrural lymph node (axial image 109 of series 4) measuring 1.9 cm in short axis. Esophagus is unremarkable in appearance. No axillary lymphadenopathy. Lungs/Pleura: Numerous pulmonary nodules and masses are noted throughout the lungs bilaterally. The largest confluent mass or conglomeration of masses is in the right lower lobe (axial image 90 of series 4) measuring 6.7 x 6.7 cm. Several of these lesions demonstrate surrounding ground-glass attenuation, suggesting perilesional hemorrhage. No pleural effusions. Upper Abdomen: Unremarkable. Musculoskeletal: There are no aggressive appearing lytic or blastic lesions noted in the visualized portions of the skeleton. Review of the MIP images confirms the above findings. IMPRESSION: 1. Despite the mild limitations of today's examination, there is no evidence to  suggest clinically significant central, lobar or segmental sized pulmonary embolism. 2. Widespread metastatic disease to the chest, including numerous large pulmonary nodules and masses, many of which demonstrate surrounding ground-glass a

## 2021-06-01 NOTE — Progress Notes (Signed)
?PROGRESS NOTE ? ? ? ?Kristen Freeman  JXB:[id number] DOB: Nov 26, 1950 DOA: 05/19/2021 ?PCP: Allie Dimmer, MD  ?No chief complaint on file. ? ? ?Brief Narrative:  ?71 year old female with history of endometrial cancer in 2019 and in remission until Dec '22 when she was found to have recurrence, underwent XRT and chemo completing treatment 04/25/21. She had an episode of hemoptysis 05/17/21. She sought care at ED in [place]. She was found to have thrombocytopenia and did had platelet transfusion. Other Lab - Glucose 115, INR 1.1, Hgb 8.4, WBC 4.4, Plt 59, nl Diff. CTA chest - increased pulmonary mets: multiple large nodules. ? Post-obstructive consolidation with nodule 7.6x3.2x2 cm. Bulky adenopathy.EKG - nl.  She was subsequently transferred to [facility] for further evaluation and management.   ?3.26- transferred to stepdown/icu for hemoptysis recurring- s/p bronchoscopy: showed "1. 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 Blocking entire distal airay into RML and RLL" ?Rad on hemonc and palliative team were consulted 3/27  ? ? ?Assessment & Plan: ?  ?Principal Problem: ?  Hemoptysis ?Active Problems: ?  Malignant neoplasm of endometrium metastatic to lung [facility]) ?  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  Thrombocytopenia ([place]) ?  Acute blood loss anemia ?  Pancytopenia ([place]) ?  Hypokalemia ?  Glaucoma ?  HTN (hypertension) ?  HLD (hyperlipidemia) ?  Normocytic anemia ?  Iron deficiency anemia ?  Malignant neoplasm metastatic to lung [facility]) ?  Malignant neoplasm metastatic to both lungs [facility]) ?  Other constipation ? ? 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 ?Plan at this time for radiation oncology for tumor debulking ?Radiation started 3/27 -> last treatment 4/7 ?Appreciate rad oncology ?S/p flexible bronchoscopy 3/26 -> R bronchus intermedius lesion  ?Recurrent hemoptysis  developed 4/1, now improved ?CT 4/1 with right lower lobe mass increased in size, bilateral pulm nodules increased in size, mediastinal and L supraclavicular LAD increased in size ?PCCM signed off 4/3 - recommended continuing to hold anticoagulation given severity of prior bleeds - consider IR pulm angiogram with embolization to area of RLL if rebleeds  - lesion in bronchus intermedius likely inspissated clot ?She continues to have some intermittent hemoptysis, but this appears mostly stable/improving ?IR embolization deferred for life threatening hemoptysis ? ?Malignant neoplasm of endometrium metastatic to lung [facility]) ?S/p carboplatin 4/5 ?Radiation therapy, started 3/27, per rad onc as above (last treatment should be 4/7) ?See 05/23/2021 oncology note for detailed oncologic history - dx stage Ib, high grade endometrial cancer s/p robotic assisted total laparoscopic hysterectomy, bilateral salpingo oophorectomy, lphatic mapping with bilateral pelvic and periaortic sentinel LN excision, omental bx and mini lapartomy on 02/13/2018 ?CT C/[place]/P 3/26 with bulky adenopathy in L retroperitoneum along aorta and extending into peritoneal space, peritoneal nodularity in L adnexal region and retrocrural space additionally, lobar consolidation mass in R lower lobe.  Widespread metastatic disease to chest, including numerous large pulmonary nodules and masses, many of which demonstrate surrounding ground glass attenuation suggestive of perilesional alveolar hemorrhage, extensive mediastinal and L supraclavicular LAD. ? 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 ?Thrombocytopenia ([place]) ?Stable at this time ?Transfuse for goal >50,000 ? ?Acute blood loss anemia ?Hb seens relatively stable today, will follow ?Labs c/w AOCD. Normal B12. Getting folic acid supplementation. ? ?Pancytopenia  ([place]) ?Will continue to  monitor ? ?Hypokalemia ?follow ? ?Glaucoma ?May use eye drops from home. ? ?HTN (hypertension) ?metoprolol ? ?HLD (hyperlipidemia) ?atorva on hold ? ? ?DVT prophylaxis: SCD ?Code Status: full ?Family Communication: son at bedside ?Disposition:  ? ?Status is: Inpatient ?Remains inpatient appropriate because: need for continued chemotherapy, monitoring of pancytopenia, resolution of hematemesis ?  ?Consultants:  ?Heme onc ?IR ?Vascular ?pulmonology ? ?Procedures:  ?Flexible bronch 3/26 ? ?LE [place] ?Summary:  ?BILATERAL:  ?- No evidence of superficial venous thrombosis in the lower extremities,  ?bilaterally.  ?-No evidence of popliteal cyst, bilaterally.  ?RIGHT:  ?- Findings consistent with age indeterminate deep vein thrombosis  ?involving the right popliteal vein.  ?   ?LEFT:  ?- Findings consistent with acute deep vein thrombosis involving the left  ?peroneal veins.  ?   ? ?Antimicrobials:  ?Anti-infectives (From admission, onward)  ? ? None  ? ?  ? ? ?Subjective: ?No new complaints ?Did well without hemoptysis yesterday after initial event, then had some more again this morning (small amount) ? ?Objective: ?Vitals:  ? 05/31/21 2050 06/01/21 0411 06/01/21 0955 06/01/21 1351  ?BP: (!) 158/98 (!) 141/88 124/81 129/79  ?Pulse: (!) 110 89 (!) 105 92  ?Resp: 18 18    ?Temp: (!) 97.5 ?F (36.4 ?C) 97.9 ?F (36.6 ?C)  98 ?F (36.7 ?C)  ?TempSrc: Oral Oral  Oral  ?SpO2: 99% 98%  100%  ?Weight:      ?Height:      ? ? ?Intake/Output Summary (Last 24 hours) at 06/01/2021 1708 ?Last data filed at 06/01/2021 1352 ?Gross per 24 hour  ?Intake 240 ml  ?Output --  ?Net 240 ml  ? ?Filed Weights  ? 05/22/21 0800 05/24/21 2229  ?Weight: 55.9 kg 55.8 kg  ? ? ?Examination: ? ?General: No acute distress. ?Cardiovascular: RRR ?Lungs: unlabored ?Abdomen: Soft, nontender, nondistended  ?Neurological: Alert and oriented ?3. Moves all extremities ?4 . Cranial nerves II through XII grossly intact. ?Skin: Warm and dry. No rashes  or lesions. ?Extremities: No clubbing or cyanosis. No edema.  ? ? ?Data Reviewed: I have personally reviewed following labs and imaging studies ? ?CBC: ?Recent Labs  ?Lab 05/28/21 ?0600 05/28/21 ?1418 05/28/21 ?2015 05/29/21 ?4098 05/30/21 ?1191 05/31/21 ?4782 06/01/21 ?0417  ?WBC 4.4  --   --  3.7* 2.9* 2.1* 2.9*  ?NEUTROABS  --   --   --   --   --  1.8 2.6  ?HGB 7.7*   < > 9.0* 9.9* 8.9* 8.8* 8.5*  ?HCT 23.0*   < > 26.2* 28.7* 25.8* 26.3* 25.5*  ?MCV 97.9  --   --  95.0 95.6 97.8 97.0  ?PLT 58*  --   --  54* 49* 76* 73*  ? < > = values in this interval not displayed.  ? ? ?Basic Metabolic Panel: ?Recent Labs  ?Lab 05/30/21 ?0426 05/31/21 ?9562 06/01/21 ?0417  ?NA 137 135 136  ?K 3.0* 3.8 4.1  ?CL 104 102 105  ?CO2 _0 ?GLUCOSE 115* 121* 163*  ?BUN _1 ?CREATININE 0.62 0.54 0.46  ?CALCIUM 8.5* 8.7* 8.8*  ?MG  --  2.2 2.3  ?PHOS  --   --  3.4  ? ? ?GFR: ?Estimated Creatinine Clearance: 48.4 mL/min (by C-G formula based on SCr of 0.46 mg/dL). ? ?Liver Function Tests: ?Recent Labs  ?Lab 06/01/21 ?1308  ?AST 29  ?ALT 26  ?ALKPHOS 78  ?BILITOT 1.3*  ?PROT 6.3*  ?ALBUMIN 2.8*  ? ? ?CBG: ?  No results for input(s): GLUCAP in the last 168 hours. ? ? ?No results found for this or any previous visit (from the past 240 hour(s)).  ? ? ? ? ? ?Radiology Studies: ?No results found. ? ? ? ? ? ?Scheduled Meds: ? Chlorhexidine Gluconate Cloth  6 each Topical Daily  ? chlorpheniramine-HYDROcodone  5 mL Oral Q6H  ? famotidine  20 mg Oral Daily  ? folic acid  1 mg Oral Daily  ? latanoprost  1 drop Both Eyes QHS  ? metoprolol tartrate  50 mg Oral BID  ? polyethylene glycol  17 g Oral Daily  ? senna  1 tablet Oral BID  ? sodium chloride flush  10-40 mL Intracatheter Q12H  ? sodium chloride flush  3 mL Intravenous Q12H  ? ?Continuous Infusions: ? sodium chloride Stopped (05/28/21 1846)  ? ? ? LOS: 13 days  ? ? ?Time spent: over 30 min ? ? ? ?Fayrene Helper, MD ?[facility] ? ? ?To contact the attending provider between  7A-7P or the covering provider during after hours 7P-7A, please log into the web site [email or web address] and access using universal [place] password for that web site. If you do not have the password, ple

## 2021-06-02 ENCOUNTER — Other Ambulatory Visit: Payer: Self-pay | Admitting: Hematology and Oncology

## 2021-06-02 ENCOUNTER — Other Ambulatory Visit (HOSPITAL_COMMUNITY): Payer: Self-pay

## 2021-06-02 ENCOUNTER — Ambulatory Visit: Payer: Medicare Other

## 2021-06-02 ENCOUNTER — Encounter: Payer: Self-pay | Admitting: Radiation Oncology

## 2021-06-02 ENCOUNTER — Ambulatory Visit
Admit: 2021-06-02 | Discharge: 2021-06-02 | Disposition: A | Discharge status: Home / Self Care | Payer: Medicare Other | Attending: Radiation Oncology | Admitting: Radiation Oncology

## 2021-06-02 ENCOUNTER — Encounter: Payer: Self-pay | Admitting: Hematology and Oncology

## 2021-06-02 DIAGNOSIS — C7800 Secondary malignant neoplasm of unspecified lung: Secondary | ICD-10-CM

## 2021-06-02 DIAGNOSIS — D509 Iron deficiency anemia, unspecified: Secondary | ICD-10-CM

## 2021-06-02 DIAGNOSIS — R042 Hemoptysis: Secondary | ICD-10-CM | POA: Diagnosis not present

## 2021-06-02 LAB — CBC WITH DIFFERENTIAL/PLATELET
Abs Immature Granulocytes: 0.03 10*3/uL (ref 0.00–0.07)
Basophils Absolute: 0 10*3/uL (ref 0.0–0.1)
Basophils Relative: 0 %
Eosinophils Absolute: 0 10*3/uL (ref 0.0–0.5)
Eosinophils Relative: 1 %
HCT: 26.5 % — ABNORMAL LOW (ref 36.0–46.0)
Hemoglobin: 8.6 g/dL — ABNORMAL LOW (ref 12.0–15.0)
Immature Granulocytes: 1 %
Lymphocytes Relative: 7 %
Lymphs Abs: 0.2 10*3/uL — ABNORMAL LOW (ref 0.7–4.0)
MCH: 32 pg (ref 26.0–34.0)
MCHC: 32.5 g/dL (ref 30.0–36.0)
MCV: 98.5 fL (ref 80.0–100.0)
Monocytes Absolute: 0.3 10*3/uL (ref 0.1–1.0)
Monocytes Relative: 11 %
Neutro Abs: 2 10*3/uL (ref 1.7–7.7)
Neutrophils Relative %: 80 %
Platelets: 74 10*3/uL — ABNORMAL LOW (ref 150–400)
RBC: 2.69 MIL/uL — ABNORMAL LOW (ref 3.87–5.11)
RDW: 16.2 % — ABNORMAL HIGH (ref 11.5–15.5)
WBC: 2.5 10*3/uL — ABNORMAL LOW (ref 4.0–10.5)
nRBC: 0 % (ref 0.0–0.2)

## 2021-06-02 LAB — COMPREHENSIVE METABOLIC PANEL
ALT: 29 U/L (ref 0–44)
AST: 33 U/L (ref 15–41)
Albumin: 2.8 g/dL — ABNORMAL LOW (ref 3.5–5.0)
Alkaline Phosphatase: 75 U/L (ref 38–126)
Anion gap: 6 (ref 5–15)
BUN: 17 mg/dL (ref 8–23)
CO2: 26 mmol/L (ref 22–32)
Calcium: 8.7 mg/dL — ABNORMAL LOW (ref 8.9–10.3)
Chloride: 103 mmol/L (ref 98–111)
Creatinine, Ser: 0.59 mg/dL (ref 0.44–1.00)
GFR, Estimated: >60 mL/min (ref >60)
Glucose, Bld: 97 mg/dL (ref 70–99)
Potassium: 3.7 mmol/L (ref 3.5–5.1)
Sodium: 135 mmol/L (ref 135–145)
Total Bilirubin: 0.9 mg/dL (ref 0.3–1.2)
Total Protein: 6.4 g/dL — ABNORMAL LOW (ref 6.5–8.1)

## 2021-06-02 LAB — PHOSPHORUS: Phosphorus: 2.3 mg/dL — ABNORMAL LOW (ref 2.5–4.6)

## 2021-06-02 LAB — MAGNESIUM: Magnesium: 2.2 mg/dL (ref 1.7–2.4)

## 2021-06-02 MED ORDER — ATORVASTATIN CALCIUM 10 MG PO TABS
10.0000 mg | ORAL_TABLET | Freq: Every day | ORAL | 1 refills | Status: AC
  Filled 2021-06-02: qty 30, 30d supply, fill #0

## 2021-06-02 MED ORDER — METOPROLOL TARTRATE 50 MG PO TABS: 50.0000 mg | ORAL_TABLET | Freq: Two times a day (BID) | ORAL | 1 refills | Status: AC

## 2021-06-02 MED ORDER — HYDROCOD POLI-CHLORPHE POLI ER 10-8 MG/5ML PO SUER: 5.0000 mL | Freq: Two times a day (BID) | ORAL | 0 refills | Status: DC | PRN

## 2021-06-02 MED ORDER — HEPARIN SOD (PORK) LOCK FLUSH 100 UNIT/ML IV SOLN
500.0000 [IU] | Freq: Once | INTRAVENOUS | Status: AC
  Administered 2021-06-02: 500 [IU] via INTRAVENOUS
  Filled 2021-06-02: qty 5

## 2021-06-02 MED ORDER — METOPROLOL TARTRATE 50 MG PO TABS
50.0000 mg | ORAL_TABLET | Freq: Two times a day (BID) | ORAL | 1 refills | Status: DC
  Filled 2021-06-02: qty 60, 30d supply, fill #0

## 2021-06-02 MED ORDER — FOLIC ACID 1 MG PO TABS
1.0000 mg | ORAL_TABLET | Freq: Every day | ORAL | 1 refills | Status: AC
  Filled 2021-06-02: qty 30, 30d supply, fill #0

## 2021-06-02 MED ORDER — MIRALAX 17 GM/SCOOP PO POWD
17.0000 g | Freq: Every day | ORAL | 1 refills | Status: AC
  Filled 2021-06-02: qty 116, 6d supply, fill #0

## 2021-06-02 MED ORDER — FOLIC ACID 1 MG PO TABS: 1.0000 mg | ORAL_TABLET | Freq: Every day | ORAL | 1 refills | Status: DC

## 2021-06-02 MED ORDER — FAMOTIDINE 20 MG PO TABS
20.0000 mg | ORAL_TABLET | Freq: Every day | ORAL | 1 refills | Status: DC | PRN
  Filled 2021-06-02: qty 30, 30d supply, fill #0

## 2021-06-02 MED ORDER — ONDANSETRON HCL 4 MG PO TABS
4.0000 mg | ORAL_TABLET | Freq: Three times a day (TID) | ORAL | 0 refills | Status: AC | PRN
  Filled 2021-06-02: qty 15, 5d supply, fill #0

## 2021-06-02 MED ORDER — HYDROCOD POLI-CHLORPHE POLI ER 10-8 MG/5ML PO SUER
5.0000 mL | Freq: Two times a day (BID) | ORAL | 0 refills | Status: AC | PRN
  Filled 2021-06-02: qty 50, 5d supply, fill #0

## 2021-06-02 NOTE — Discharge Summary (Addendum)
Physician Discharge Summary  ?Kristen Freeman OJJ:[id number] DOB: 10/14/1950 DOA: 05/19/2021 ? ?PCP: Allie Dimmer, MD ? ?Admit date: 05/19/2021 ?Discharge date: 06/02/2021 ? ?Time spent: 40 minutes ? ?Recommendations for Outpatient Follow-up:  ?Follow outpatient CBC/CMP  ?Follow oncology outpatient ?Continue to follow hemoptysis ?Follow with Dr. Alvy Bimler for continued discussion on risks/benefits anticoagulation (risk outweigh benefits with continued hemoptysis at this time)  ? ?Discharge Diagnoses:  ?Principal Problem: ?  Hemoptysis ?Active Problems: ?  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  Thrombocytopenia ([place]) ?  Acute blood loss anemia ?  Pancytopenia ([place]) ?  Hypokalemia ?  Glaucoma ?  HTN (hypertension) ?  HLD (hyperlipidemia) ?  Normocytic anemia ?  Iron deficiency anemia ?  Malignant neoplasm metastatic to lung [place]) ?  Malignant neoplasm metastatic to both lungs [facility]) ?  Other constipation ? ? ?Discharge Condition: stable ? ?Diet recommendation: heart healthy ? ?Filed Weights  ? 05/22/21 0800 05/24/21 2229  ?Weight: 55.9 kg 55.8 kg  ? ? ?History of present illness:  ?71 year old female with history of endometrial cancer in 2019 and in remission until Dec '22 when she was found to have recurrence, underwent XRT and chemo completing treatment 04/25/21. She had an episode of hemoptysis 05/17/21. She sought care at ED in [place]. She was found to have thrombocytopenia and did had platelet transfusion. Other Lab - Glucose 115, INR 1.1, Hgb 8.4, WBC 4.4, Plt 59, nl Diff. CTA chest - increased pulmonary mets: multiple large nodules. ? Post-obstructive consolidation with nodule 7.6x3.2x2 cm. Bulky adenopathy.EKG - nl.  She was subsequently transferred to [facility] for further evaluation and management.   She developed hemoptysis and was transferred to ICU/stepdown.  Now s/p  bronchoscopy showing right bronchus intermedius lesion - clot with tumor (blocking entire distal airway into RML and RLL).  Radiation oncology and oncology were consulted.  She was treated with TXA nebs, has now completed radiation therapy.  She also is now s/p chemotherapy.  Her hemoptysis has overall improved, but is still persistent.  Notably, she had imaging with IVC thrombosis.  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  She's not currently Eris Breck candidate for anticoagulation given her ongoing hemoptysis.  IR noted she was not candidate for thrombectomy/IVC filter.  At this time she's been stable, planning for outpatient oncology follow up. ? ?See below for additional details ? ?Hospital Course:  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 ?Plan at this time for radiation oncology for tumor debulking ?Radiation started 3/27 -> last treatment 4/7 ?Appreciate rad oncology ?S/p flexible bronchoscopy 3/26 -> R bronchus intermedius lesion  ?Recurrent hemoptysis developed 4/1, now improved. She continues to have some intermittent hemoptysis, but this appears mostly stable/improving. ?CT 4/1 with right lower lobe mass increased in size, bilateral pulm nodules increased in size, mediastinal and L supraclavicular LAD increased in size ?PCCM signed off 4/3 - recommended continuing to hold anticoagulation given severity of prior bleeds - consider IR pulm angiogram with embolization to area of RLL if rebleeds  - lesion in bronchus intermedius likely inspissated clot ?IR embolization deferred for life threatening hemoptysis ? 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 Alvy Bimler ?See 05/23/2021 oncology note for detailed oncologic history - dx stage Ib, high grade endometrial cancer s/p  robotic assisted total laparoscopic hysterectomy, bilateral  salpingo oophorectomy, lphatic mapping with bilateral pelvic and periaortic sentinel LN excision, omental bx and mini lapartomy on 02/13/2018 ?CT C/Hattye Siegfried/P 3/26 with bulky adenopathy in L retroperitoneum along aorta and extending into peritoneal space, peritoneal nodularity in L adnexal region and retrocrural space additionally, lobar consolidation mass in R lower lobe.  Widespread metastatic disease to chest, including numerous large pulmonary nodules and masses, many of which demonstrate surrounding ground glass attenuation suggestive of perilesional alveolar hemorrhage, extensive mediastinal and L supraclavicular LAD. ? 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 ?Thrombocytopenia ([place]) ?Stable at this time ?Transfuse for goal >50,000 ? ?Acute blood loss anemia ?Hb seens relatively stable today, will follow ?Labs c/w AOCD. Normal B12. Getting folic acid supplementation. ? ?Pancytopenia ([place]) ?Will continue to monitor ? ?Hypokalemia ?follow ? ?Glaucoma ?May use eye drops from home. ? ?HTN (hypertension) ?Metoprolol, will d/c HCTZ/k supplementation as she's done ok off this ? ?HLD (hyperlipidemia) ?Resume atorva at home ? ? ?Procedures: ?Flexible bronch 3/26 ?  ?LE [place] ?Summary:  ?BILATERAL:  ?- No evidence of superficial venous thrombosis in the lower extremities,  ?bilaterally.  ?-No evidence of popliteal cyst, bilaterally.  ?RIGHT:  ?- Findings consistent with age indeterminate deep vein thrombosis  ?involving the right popliteal vein.  ?   ?LEFT:  ?- Findings consistent with acute deep vein thrombosis involving the left  ?peroneal veins   ? ?Consultations: ?Heme ?IR ?Vascular ?Pulm ?palliative ? ?Discharge Exam: ?Vitals:  ? 06/01/21 2051 06/02/21 0525  ?BP: 132/87 114/77  ?Pulse: 97 85  ?Resp: 16 18  ?Temp: (!) 97.4 ?F (36.3 ?C) 97.8 ?F (36.6 ?C)  ?SpO2: 98% 98%  ? ?No new complaints ?Stable hemoptysis in mornign, seems to do well without the rest of the days ?Daughter and son at bedside ? ?General: No acute distress. ?Cardiovascular: Heart sounds show Soleil Mas regular rate, and rhythm.  ?Lungs: Clear to auscultation bilaterally ?Abdomen: Soft, nontender, nondistended  ?Neurological: Alert and oriented ?3. Moves all extremities ?4. Cranial nerves II through XII grossly intact. ?Skin: Warm and dry. No rashes or lesions. ?Extremities: No clubbing or cyanosis. No edema.  ? ?Discharge Instructions ? ? ?Discharge Instructions   ? ? Call MD for:  difficulty breathing, headache or visual disturbances   Complete by: As directed ?  ? Call MD for:  extreme fatigue   Complete by: As directed ?  ? Call MD for:  hives   Complete by: As directed ?  ? Call MD for:  persistant dizziness or light-headedness   Complete by: As directed ?  ? Call MD for:  persistant nausea and vomiting   Complete by: As directed ?  ? Call MD for:  redness, tenderness, or signs of infection (pain, swelling, redness, odor or green/yellow discharge around incision site)   Complete by: As directed ?  ? Call MD for:  severe uncontrolled pain   Complete by: As directed ?  ? Call MD for:  temperature >100.4   Complete by: As directed ?  ? Diet - low sodium heart healthy   Complete by: As directed ?  ? Discharge instructions   Complete by: As directed ?  ?  You were seen for your metastatic endometrial cancer. ? ?You had hemoptysis (coughing up blood from lungs) related to the metastasis (cancer).  You had Clementine Soulliere bronchscopy and were started on radiation therapy which has been completed today.  You also had chemotherapy during your admission.  Your hemoptysis seems to be gradually improving. ? ?You have  blood clots which are unable to be treated with blood thinners at this time.  We discussed your case with the radiologists who did not recommend Mehreen Azizi filter at this time.  You'll continue to follow your bleeding with

## 2021-06-02 NOTE — Progress Notes (Signed)
Kristen Freeman   DOB:24-Sep-1950   YB#:[id number]   ? 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 ?Unfortunately, due to recurrent hemoptysis, she is transferred back to ICU and then back to regular floor ?I will continue to follow daily ?After much discussion, we are in agreement to try single agent carboplatin only, given on 05/31/21 ?She will continue palliative radiation treatment for now, to be completed today ?  ?Hemoptysis, recurrent ?Secondary to lung metastasis ?She is undergoing palliative radiation therapy to her chest ?Continue supportive care ?Due to daily hemoptysis of relatively fresh looking blood, she is not safe to start anticoagulation ?  ?IVC thrombus ?CT abdomen/pelvis shows a filling defect in the infrarenal IVC and bilateral lower extremity Doppler ultrasound confirmed DVT, unable to anticoagulate secondary to recent hemoptysis ?IR as well as vascular surgery have been requested for consideration of thrombectomy versus IVC filter placement, overall, deemed not a candidate for thrombectomy or IVC filter placement ?I reviewed the danger of not treating her IVC thrombus with the patient and family ?She is not stable yet today to start heparin due to recurrent hemoptysis ?Due to ongoing hemoptysis, I plan to defer anticoagulation therapy until a later date ?  ?Macrocytic anemia ?Anemia due to recent hemoptysis, transfused multiple times ?Serum iron and B12 level were within normal range ?I have started her on folic acid supplementation ?  ?Thrombocytopenia ?Likely due to recent chemoradiation therapy in [place] as well as consumptive secondary to recent bleeding  ?Received 1 unit of platelets on 05/21/2021 due to active bleeding ?Monitor closely ?I recommend keeping platelet count at 50,000 or above, improved due to platelet transfusion on 05/30/2021 ?  ?Goals of care ?I have a long discussion with patient and family in regards to [place] and  goals of care ?She would like to receive aggressive care as much as possible ?In the event of cardiorespiratory arrest, she would like to be resuscitated but she does not want to be maintained on life support indefinitely ?Her husband and 2 sons are her dedicated healthcare power of attorney ?  ?Discharge planning ?It is very difficult to make a decision to discharge her with ongoing hemoptysis and pancytopenia but her condition has been relatively stable for the last 3 days ?We discussed the risk and benefits of discharge with close follow-up versus staying in the hospital for close observation ?If she is comfortable to be discharged, I have arranged for outpatient follow-up Monday morning along with repeat blood work and further discussion about plan of care ? ?All questions were answered. The patient knows to call the clinic with any problems, questions or concerns. ?  ?The total time spent in the appointment was 40 minutes encounter with patients including review of chart and various tests results, discussions about plan of care and coordination of care plan ? ?Heath Lark, MD ?06/02/2021 8:30 AM ? ?Subjective:  ?She is seen this morning ?Her son is present by the bedside ?She continues to have hemoptysis ? ?Objective:  ?Vitals:  ? 06/01/21 2051 06/02/21 0525  ?BP: 132/87 114/77  ?Pulse: 97 85  ?Resp: 16 18  ?Temp: (!) 97.4 ?F (36.3 ?C) 97.8 ?F (36.6 ?C)  ?SpO2: 98% 98%  ?  ? ?Intake/Output Summary (Last 24 hours) at 06/02/2021 0830 ?Last data filed at 06/01/2021 1352 ?Gross per 24 hour  ?Intake 240 ml  ?Output --  ?Net 240 ml  ? ? ?GENERAL:alert, no distress and comfortable ?NEURO: alert & oriented x 3  with fluent speech, no focal motor/sensory deficits ?  ?Labs:  ?Recent Labs  ?  05/21/21 ?1140 05/22/21 ?0441 05/31/21 ?7741 06/01/21 ?0417 06/02/21 ?0451  ?NA  --    < > 135 136 135  ?K  --    < > 3.8 4.1 3.7  ?CL  --    < > 102 105 103  ?CO2  --    < > '26 24 26  '$ ?GLUCOSE  --    < > 121* 163* 97  ?BUN  --    < > '12 13  17  '$ ?CREATININE  --    < > 0.54 0.46 0.59  ?CALCIUM  --    < > 8.7* 8.8* 8.7*  ?GFRNONAA  --    < > >60 >60 >60  ?PROT 6.7  --   --  6.3* 6.4*  ?ALBUMIN 3.3*  --   --  2.8* 2.8*  ?AST 27  --   --  29 33  ?ALT 23  --   --  26 29  ?ALKPHOS 67  --   --  78 75  ?BILITOT 0.8  --   --  1.3* 0.9  ?BILIDIR 0.2  --   --   --   --   ?IBILI 0.6  --   --   --   --   ? < > = values in this interval not displayed.  ? ? ?Studies:  ?CT Angio Chest Pulmonary Embolism (PE) W or WO Contrast ? ?Result Date: 05/21/2021 ?CLINICAL DATA:  71 year old female with history of abnormal chest x-ray. Endometrial cancer. Hemoptysis. * Tracking Code: BO * EXAM: CT ANGIOGRAPHY CHEST WITH CONTRAST TECHNIQUE: Multidetector CT imaging of the chest was performed using the standard protocol during bolus administration of intravenous contrast. Multiplanar CT image reconstructions and MIPs were obtained to evaluate the vascular anatomy. RADIATION DOSE REDUCTION: This exam was performed according to the departmental dose-optimization program which includes automated exposure control, adjustment of the mA and/or kV according to patient size and/or use of iterative reconstruction technique. CONTRAST:  98m OMNIPAQUE IOHEXOL 350 MG/ML SOLN COMPARISON:  No priors. FINDINGS: Comment: Today's study is limited by considerable patient respiratory motion. Cardiovascular: There are no central, lobar or segmental sized filling defects within the pulmonary arterial tree to suggest pulmonary embolism. Smaller distal subsegmental sized emboli can not be entirely excluded on the basis of today's examination. Heart size is normal. There is no significant pericardial fluid, thickening or pericardial calcification. There is aortic atherosclerosis, as well as atherosclerosis of the great vessels of the mediastinum and the coronary arteries, including calcified atherosclerotic plaque in the left anterior descending coronary arteries. Left internal jugular single-lumen porta  cath with tip terminating in the right atrium. Mediastinum/Nodes: There is extensive lymphadenopathy most evident in the superior mediastinum and left supraclavicular region. The largest left supraclavicular lymph nodes measure 2.2 cm in short axis on axial images 5 and 12 of series 4. The largest superior mediastinal nodal mass measures 5.1 x 2.3 cm (axial image 35 of series 4) anterior to the origin of the great vessels. High right paratracheal lymph node measuring 1.6 cm in short axis. Prominent but nonenlarged bilateral hilar lymph nodes are noted. Middle mediastinal lymphadenopathy measuring up to 2.2 cm in short axis adjacent to the descending thoracic aorta (axial image 96 of series 4). Right retrocrural lymph node (axial image 109 of series 4) measuring 1.9 cm in short axis. Esophagus is unremarkable in appearance. No axillary lymphadenopathy. Lungs/Pleura: Numerous pulmonary  nodules and masses are noted throughout the lungs bilaterally. The largest confluent mass or conglomeration of masses is in the right lower lobe (axial image 90 of series 4) measuring 6.7 x 6.7 cm. Several of these lesions demonstrate surrounding ground-glass attenuation, suggesting perilesional hemorrhage. No pleural effusions. Upper Abdomen: Unremarkable. Musculoskeletal: There are no aggressive appearing lytic or blastic lesions noted in the visualized portions of the skeleton. Review of the MIP images confirms the above findings. IMPRESSION: 1. Despite the mild limitations of today's examination, there is no evidence to suggest clinically significant central, lobar or segmental sized pulmonary embolism. 2. Widespread metastatic disease to the chest, including numerous large pulmonary nodules and masses, many of which demonstrate surrounding ground-glass attenuation suggestive of perilesional alveolar hemorrhage (particularly given the patient's history of hemoptysis). There is also extensive mediastinal and left supraclavicular  lymphadenopathy. 3. Aortic atherosclerosis, in addition to left anterior descending coronary artery disease. Please note that although the presence of coronary artery calcium documents the presence of coronary

## 2021-06-02 NOTE — Progress Notes (Signed)
Pt coughed nickel size amt dark red clot x1 this morning ?

## 2021-06-03 ENCOUNTER — Telehealth: Payer: Self-pay | Admitting: Hematology and Oncology

## 2021-06-03 NOTE — Telephone Encounter (Signed)
I called the patient and check on her ?According to the patient, she continues to have productive cough but with minimal/scant hemoptysis, much improved compared to when she was in the hospital ?If she continues to improve, I plan to start her on anticoagulation therapy next week ?I will call to check on her again tomorrow ?I have addressed all her questions ?

## 2021-06-04 ENCOUNTER — Encounter: Payer: Self-pay | Admitting: Hematology and Oncology

## 2021-06-04 NOTE — Progress Notes (Signed)
I called the patient this morning ?She had near complete resolution of cough ?She denies hemoptysis today ?I will see her tomorrow as scheduled ?I plan to start her on anticoagulation therapy tomorrow ?She expressed verbal understanding ?

## 2021-06-05 ENCOUNTER — Other Ambulatory Visit: Payer: Self-pay

## 2021-06-05 ENCOUNTER — Other Ambulatory Visit (HOSPITAL_COMMUNITY): Payer: Self-pay

## 2021-06-05 ENCOUNTER — Inpatient Hospital Stay: Payer: Medicare Other

## 2021-06-05 ENCOUNTER — Ambulatory Visit: Payer: Medicare Other

## 2021-06-05 ENCOUNTER — Encounter: Payer: Self-pay | Admitting: Hematology and Oncology

## 2021-06-05 ENCOUNTER — Inpatient Hospital Stay (HOSPITAL_BASED_OUTPATIENT_CLINIC_OR_DEPARTMENT_OTHER): Payer: Medicare Other | Admitting: Hematology and Oncology

## 2021-06-05 ENCOUNTER — Other Ambulatory Visit: Payer: Self-pay | Admitting: *Deleted

## 2021-06-05 ENCOUNTER — Inpatient Hospital Stay: Payer: Medicare Other | Attending: Hematology and Oncology

## 2021-06-05 VITALS — BP 138/85 | HR 94 | Temp 98.8°F | Resp 18 | Ht 59.0 in | Wt 118.4 lb

## 2021-06-05 DIAGNOSIS — C541 Malignant neoplasm of endometrium: Secondary | ICD-10-CM | POA: Diagnosis present

## 2021-06-05 DIAGNOSIS — Z95828 Presence of other vascular implants and grafts: Secondary | ICD-10-CM

## 2021-06-05 DIAGNOSIS — E441 Mild protein-calorie malnutrition: Secondary | ICD-10-CM

## 2021-06-05 DIAGNOSIS — I82220 Acute embolism and thrombosis of inferior vena cava: Secondary | ICD-10-CM

## 2021-06-05 DIAGNOSIS — D61818 Other pancytopenia: Secondary | ICD-10-CM

## 2021-06-05 DIAGNOSIS — R042 Hemoptysis: Secondary | ICD-10-CM

## 2021-06-05 DIAGNOSIS — C7800 Secondary malignant neoplasm of unspecified lung: Secondary | ICD-10-CM | POA: Insufficient documentation

## 2021-06-05 DIAGNOSIS — D509 Iron deficiency anemia, unspecified: Secondary | ICD-10-CM

## 2021-06-05 LAB — COMPREHENSIVE METABOLIC PANEL
ALT: 16 U/L (ref 0–44)
AST: 27 U/L (ref 15–41)
Albumin: 3.2 g/dL — ABNORMAL LOW (ref 3.5–5.0)
Alkaline Phosphatase: 90 U/L (ref 38–126)
Anion gap: 6 (ref 5–15)
BUN: 11 mg/dL (ref 8–23)
CO2: 24 mmol/L (ref 22–32)
Calcium: 8.6 mg/dL — ABNORMAL LOW (ref 8.9–10.3)
Chloride: 103 mmol/L (ref 98–111)
Creatinine, Ser: 0.55 mg/dL (ref 0.44–1.00)
GFR, Estimated: >60 mL/min (ref >60)
Glucose, Bld: 159 mg/dL — ABNORMAL HIGH (ref 70–99)
Potassium: 3.8 mmol/L (ref 3.5–5.1)
Sodium: 133 mmol/L — ABNORMAL LOW (ref 135–145)
Total Bilirubin: 1.2 mg/dL (ref 0.3–1.2)
Total Protein: 6.5 g/dL (ref 6.5–8.1)

## 2021-06-05 LAB — CBC WITH DIFFERENTIAL/PLATELET
Abs Immature Granulocytes: 0.07 10*3/uL (ref 0.00–0.07)
Basophils Absolute: 0 10*3/uL (ref 0.0–0.1)
Basophils Relative: 0 %
Eosinophils Absolute: 0 10*3/uL (ref 0.0–0.5)
Eosinophils Relative: 0 %
HCT: 24.6 % — ABNORMAL LOW (ref 36.0–46.0)
Hemoglobin: 8.2 g/dL — ABNORMAL LOW (ref 12.0–15.0)
Immature Granulocytes: 3 %
Lymphocytes Relative: 3 %
Lymphs Abs: 0.1 10*3/uL — ABNORMAL LOW (ref 0.7–4.0)
MCH: 32.4 pg (ref 26.0–34.0)
MCHC: 33.3 g/dL (ref 30.0–36.0)
MCV: 97.2 fL (ref 80.0–100.0)
Monocytes Absolute: 0.3 10*3/uL (ref 0.1–1.0)
Monocytes Relative: 12 %
Neutro Abs: 2.2 10*3/uL (ref 1.7–7.7)
Neutrophils Relative %: 82 %
Platelets: 74 10*3/uL — ABNORMAL LOW (ref 150–400)
RBC: 2.53 MIL/uL — ABNORMAL LOW (ref 3.87–5.11)
RDW: 16.3 % — ABNORMAL HIGH (ref 11.5–15.5)
WBC: 2.7 10*3/uL — ABNORMAL LOW (ref 4.0–10.5)
nRBC: 0 % (ref 0.0–0.2)

## 2021-06-05 LAB — SAMPLE TO BLOOD BANK

## 2021-06-05 MED ORDER — SODIUM CHLORIDE 0.9% FLUSH
10.0000 mL | Freq: Once | INTRAVENOUS | Status: AC
  Administered 2021-06-05: 10 mL

## 2021-06-05 MED ORDER — APIXABAN 5 MG PO TABS
5.0000 mg | ORAL_TABLET | Freq: Two times a day (BID) | ORAL | 3 refills | Status: AC
  Filled 2021-06-05: qty 60, 30d supply, fill #0

## 2021-06-05 MED ORDER — HEPARIN SOD (PORK) LOCK FLUSH 100 UNIT/ML IV SOLN
500.0000 [IU] | Freq: Once | INTRAVENOUS | Status: AC
  Administered 2021-06-05: 500 [IU]

## 2021-06-05 NOTE — Assessment & Plan Note (Signed)
She has multifactorial anemia, combination of blood loss anemia as well as side effects from chemotherapy ?She does not need transfusion support today ?We will monitor carefully ?There is no contraindication to remain on antiplatelet agents or anticoagulants as long as the platelet is greater than 50,000. ? ?

## 2021-06-05 NOTE — Assessment & Plan Note (Signed)
We had extensive discussion regarding the role of anticoagulation therapy ?She was assessed by interventional radiologist and vascular surgeon and was deemed not a good candidate for thrombectomy or IVC filter placement ?With hemoptysis improving, I recommend starting her on low-dose Eliquis ?I would not give her loading dose; she will start at 5 mg twice daily ?We will monitor her platelet count carefully ? ?

## 2021-06-05 NOTE — Assessment & Plan Note (Signed)
This is due to her disease ?It is improving ?She has completed palliative radiation treatment ?We will observe closely ?

## 2021-06-05 NOTE — Assessment & Plan Note (Signed)
She tolerated recent single agent carboplatin well ?The plan will be to proceed with full dose carboplatin and paclitaxel in a few weeks ?I am pleased to see that hemoptysis is resolving ?I recommend minimum 3 cycles of treatment before repeating CT imaging ?I plan to see her again next week for supportive care ?

## 2021-06-05 NOTE — Progress Notes (Signed)
[facility] ?OFFICE PROGRESS NOTE ? ?Patient Care Team: ?Allie Dimmer, MD as PCP - General (Internal Medicine) ? 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 ?Hemoptysis ?This is due to her disease ?It is improving ?She has completed palliative radiation treatment ?We will observe closely ? 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 ? ?Mild protein-calorie malnutrition ([place]) ?We discussed the importance of oral intake especially high-protein food ?I gave her flow sheet to document her oral intake at home ? ?Pancytopenia, acquired ([place]) ?She has multifactorial anemia, combination of blood loss anemia as well as side effects from chemotherapy ?She does not need transfusion support today ?We will monitor carefully ?There is no contraindication to remain on antiplatelet agents or anticoagulants as long as the platelet is greater than 50,000. ? ? ?No orders of the defined types were placed in this encounter. ? ? ?All questions were answered. The patient knows to call the clinic with any problems, questions or concerns. ?The total time spent in the appointment was 40 minutes encounter with patients including review of chart and various tests results, discussions about plan of  care and coordination of care plan ?  ?Heath Lark, MD ?06/05/2021 10:40 AM ? ?INTERVAL HISTORY: ?Please see below for problem oriented charting. ?she returns for treatment follow-up with her daughter ?Her son is available also over the phone ?I spoke with her daily over the weekend ?She had very scant hemoptysis ?She have occasional cough especially at nighttime ?Denies constipation ?No nausea ? ?REVIEW OF SYSTEMS:   ?Constitutional: Denies fevers, chills or abnormal weight loss ?Eyes: Denies blurriness of vision ?Ears, nose, mouth, throat, and face: Denies mucositis or sore throat ?Respiratory: Denies cough, dyspnea or wheezes ?Cardiovascular: Denies palpitation, chest discomfort or lower extremity swelling ?Gastrointestinal:  Denies nausea, heartburn or change in bowel habits ?Skin: Denies abnormal skin rashes ?Lymphatics: Denies new lymphadenopathy or easy bruising ?Neurological:Denies numbness, tingling or new weaknesses ?Behavioral/Psych: Mood is stable, no new changes  ?All other systems were reviewed with the patient and are negative. ? ?I have reviewed the past medical history, past surgical history, social history and family history with the patient and they are unchanged from previous note. ? ?ALLERGIES:  is allergic to latex and tape. ? ?MEDICATIONS:  ?Current Outpatient Medications  ?Medication Sig Dispense Refill  ? apixaban (ELIQUIS) 5 MG TABS tablet Take 1 tablet (5 mg total) by mouth 2 (two) times daily. 60 tablet 3  ? acetaminophen (TYLENOL) 500 MG tablet Take 500 mg by mouth every 6 (six) hours as needed for mild pain or headache.    ? atorvastatin (LIPITOR) 10 MG tablet Take 1 tablet (10 mg total) by mouth daily. 30 tablet 1  ? Calcium Carb-Cholecalciferol (CALCIUM + D3 PO) Take 1 tablet by mouth daily with breakfast.    ?  chlorpheniramine-HYDROcodone 10-8 MG/5ML Take 5 mLs by mouth every 12 (twelve) hours as needed for up to 5 days for cough. 50 mL 0  ? famotidine (PEPCID) 20 MG tablet Take 1  tablet (20 mg total) by mouth daily as needed for heartburn or indigestion. 30 tablet 1  ? folic acid (FOLVITE) 1 MG tablet Take 1 tablet (1 mg total) by mouth daily. 30 tablet 1  ? Glycerin-Polysorbate 80 (REFRESH DRY EYE THERAPY OP) Place 1 drop into both eyes 2 (two) times daily as needed (for dryness).    ? latanoprost (XALATAN) 0.005 % ophthalmic solution Place 1 drop into both eyes at bedtime.    ? metoprolol tartrate (LOPRESSOR) 50 MG tablet Take 1 tablet (50 mg total) by mouth 2 (two) times daily. 60 tablet 1  ? MIRALAX 17 GM/SCOOP powder Measure 17 g of powder and mix in 4 oz of juice or water and drink by mouth daily, or as needed for regular bowel movements 116 g 1  ? Multiple Vitamins-Minerals (ONE-A-DAY PROACTIVE 65+) TABS Take 1 tablet by mouth daily with breakfast.    ? ondansetron (ZOFRAN) 4 MG tablet Take 1 tablet (4 mg total) by mouth every 8 (eight) hours as needed for up to 5 days. 15 tablet 0  ? prochlorperazine (COMPAZINE) 10 MG tablet Take 10 mg by mouth 4 (four) times daily as needed for nausea/vomiting.    ? ?No current facility-administered medications for this visit.  ? ? ?SUMMARY OF ONCOLOGIC HISTORY: ?Oncology History Overview Note  ?High grade undifferentiated tumor ?  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 She was initially diagnosed with a stage Ib, high-grade (dedifferentiated) endometrial cancer status post robotic assisted total laparoscopic hysterectomy, bilateral salpingo-oophorectomy, lymphatic mapping with bilateral pelvic and periaortic sentinel lymph node excision, omental biopsy, mini laparotomy on 02/13/2018.  ?  ?02/26/2018 - 07/21/2018 Chemotherapy  ? 6 cycles of paclitaxel and carboplatin in [place] and had restaging CT scans performed after cycle 6 which showed NED ?  ?07/15/2018 - 08/04/2018 Radiation Therapy  ? She then completed 21 Gy vaginal brachytherapy in 3 fractions at 7 Gy per fraction on 08/04/2018 ?  ?02/23/2019  Imaging  ? CT chest/abdomen/pelvis on 02/23/2019 showed some resolved and/or stable lung nodules and the possibility of a new 4 mm right lung nodule. There was also persistent thickening of the wall of the pylorus and diffuse diverticula throughout the entire colon ?  ?04/08/2019 Procedure  ? She had a GI evaluation and is now s/p EGD/colonoscopy on 04/08/19, which demonstrated "mild reflux, mild antritis, tortuous colon, scattered diverticulosis," and a benign inflammatory pseudopolyp in the ascending colon ?  ?09/07/2019 Imaging  ? CT chest on 09/07/19 showed stable 3 and 4 mm lung nodules ?  ?06/14/2020 Imaging  ? She was last seen on 06/14/2020 a 39-monthsurveillance visit and for central venous port removal. She was asymptomatic at that time ?  ?09/13/2020 Imaging  ? CT chest/abd/pelvis on 09/08/2020 for surveillance described no definitive evidence of recurrent disease, but there was a 9 mm left retroperitoneal periaortic lymph node that appeared larger than previous and was suspicious ?  ?12/20/2020 Imaging  ? A repeat CT Abd/Pelvis on 12/20/2020 described an enlarging left paraaortic lymph node measuring 2.0 x 2.3 cm; previously 1.3 x 0.9 cm ?  ?01/27/2021 Procedure  ? Percutaneous biopsy was attempted on 01/09/2021 and was inconclusive. 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 ?  ?02/06/2021 PET scan  ? PET/CT on 02/06/2021 described metastatic hypermetabolic left common iliac and left para-aortic adenopathy.  ?  ?03/21/2021 - 04/25/2021 Chemotherapy  ? Ms. Loud then received chemoradiation in [place], [place]. she received a total of 5940 cGy to the common iliac/periaortic nodal recurrence.  Radiation was given concurrently with cisplatin.  She was unable to receive some of the weekly  cisplatin due to cytopenias.  Chemotherapy was initiated on 03/21/2021.  She had dose reduction starting with cycle 4.  Required Neulasta. She received a total of 5 cycles of weekly cisplatin with radiation treatm

## 2021-06-05 NOTE — Assessment & Plan Note (Signed)
We discussed the importance of oral intake especially high-protein food ?I gave her flow sheet to document her oral intake at home ?

## 2021-06-06 ENCOUNTER — Inpatient Hospital Stay: Payer: Medicare Other

## 2021-06-07 ENCOUNTER — Telehealth: Payer: Self-pay | Admitting: Oncology

## 2021-06-07 NOTE — Telephone Encounter (Signed)
Called Tuesday back and advised her of message below from Dr. Alvy Bimler.  She verbalized understanding and agreement. ?

## 2021-06-07 NOTE — Telephone Encounter (Signed)
Tuesday (daughter) called and said yesterday Kristen Freeman coughed up more blood than normal that was dark in color so she took 1/2 of an Eliquis tablet last night.  This morning, she coughed up some blood - not as much as yesterday - but it was bright red.  So again this morning she took 1/2 of a tablet of Eliquis.  They are wondering if she should continue taking 1/2 tablet or take a whole tablet. ? ?Tuesday is also wondering if Alee should continue taking Lipitor. ?

## 2021-06-07 NOTE — Telephone Encounter (Signed)
Ok to stay on 1/2 tab twice daily for now ?Lipitor is not important right now, we can discuss next week ?

## 2021-06-08 ENCOUNTER — Telehealth: Payer: Self-pay | Admitting: *Deleted

## 2021-06-08 NOTE — Telephone Encounter (Signed)
"  [place] daughter Tuesday Hairston [phone]), calling to confirm your receipt of my FMLA form, claim no.# [id number] [facility] delivered 06/01/2021 because I have a deadline to return form to get paid.  This needs to be for a continuous leave May 25, 2021 through June 07, 2021, I returned to work today.  Employer now says I need a new form for intermittent leave for mom's future appointments and treatments. Will bring new form for new claim number [facility] Monday April 17th this is due by May 1st.  Was told 7 - 10 business days but do you know when will the first one be completed?" ? ?Confirmed forms staff received claim 06/02/2021.  Paperwork in queue to complete within 7-10 business days which equal 14 calendar.  Currently ten forms ahead of this request.  Unable to provide date of completion as staff eagerly working to complete all forms correctly in order received.   ? ? ?

## 2021-06-09 ENCOUNTER — Telehealth: Payer: Self-pay | Admitting: Oncology

## 2021-06-09 NOTE — Telephone Encounter (Signed)
Kristen Freeman called and said she noticed that her left foot is swollen.  She said it is not red or painful.  She noticed it this morning after she ate breakfast.  She is taking Eliquis 1/2 a tablet twice a day.   ? ?Notified Dr. Alvy Bimler and advised patient that she can go the ER if it starts to get painful or to bother her and that she will see her on Monday at her appointment.  Discussed that she can elevate her foot for comfort as needed. She verbalized understanding and agreement. ? ? ?

## 2021-06-11 ENCOUNTER — Observation Stay (HOSPITAL_COMMUNITY): Payer: Medicare Other

## 2021-06-11 ENCOUNTER — Other Ambulatory Visit: Payer: Self-pay

## 2021-06-11 ENCOUNTER — Inpatient Hospital Stay (HOSPITAL_COMMUNITY)
Admission: EM | Admit: 2021-06-11 | Discharge: 2021-06-14 | DRG: 064 | Disposition: A | Discharge status: Hospice | Payer: Medicare Other | Attending: Internal Medicine | Admitting: Internal Medicine

## 2021-06-11 ENCOUNTER — Emergency Department (HOSPITAL_COMMUNITY): Payer: Medicare Other

## 2021-06-11 ENCOUNTER — Encounter (HOSPITAL_COMMUNITY): Payer: Self-pay

## 2021-06-11 DIAGNOSIS — R000 Tachycardia, unspecified: Secondary | ICD-10-CM

## 2021-06-11 DIAGNOSIS — Z91040 Latex allergy status: Secondary | ICD-10-CM

## 2021-06-11 DIAGNOSIS — H409 Unspecified glaucoma: Secondary | ICD-10-CM | POA: Diagnosis present

## 2021-06-11 DIAGNOSIS — Z833 Family history of diabetes mellitus: Secondary | ICD-10-CM

## 2021-06-11 DIAGNOSIS — I82220 Acute embolism and thrombosis of inferior vena cava: Secondary | ICD-10-CM | POA: Diagnosis not present

## 2021-06-11 DIAGNOSIS — I639 Cerebral infarction, unspecified: Secondary | ICD-10-CM | POA: Diagnosis not present

## 2021-06-11 DIAGNOSIS — I10 Essential (primary) hypertension: Secondary | ICD-10-CM | POA: Diagnosis present

## 2021-06-11 DIAGNOSIS — R55 Syncope and collapse: Secondary | ICD-10-CM | POA: Diagnosis not present

## 2021-06-11 DIAGNOSIS — Z8249 Family history of ischemic heart disease and other diseases of the circulatory system: Secondary | ICD-10-CM

## 2021-06-11 DIAGNOSIS — D6869 Other thrombophilia: Secondary | ICD-10-CM | POA: Diagnosis present

## 2021-06-11 DIAGNOSIS — Z8042 Family history of malignant neoplasm of prostate: Secondary | ICD-10-CM

## 2021-06-11 DIAGNOSIS — K920 Hematemesis: Secondary | ICD-10-CM | POA: Diagnosis present

## 2021-06-11 DIAGNOSIS — Z515 Encounter for palliative care: Secondary | ICD-10-CM

## 2021-06-11 DIAGNOSIS — D61818 Other pancytopenia: Secondary | ICD-10-CM

## 2021-06-11 DIAGNOSIS — C541 Malignant neoplasm of endometrium: Secondary | ICD-10-CM | POA: Diagnosis not present

## 2021-06-11 DIAGNOSIS — R739 Hyperglycemia, unspecified: Secondary | ICD-10-CM | POA: Diagnosis present

## 2021-06-11 DIAGNOSIS — Z803 Family history of malignant neoplasm of breast: Secondary | ICD-10-CM

## 2021-06-11 DIAGNOSIS — Z91048 Other nonmedicinal substance allergy status: Secondary | ICD-10-CM

## 2021-06-11 DIAGNOSIS — I6330 Cerebral infarction due to thrombosis of unspecified cerebral artery: Secondary | ICD-10-CM | POA: Insufficient documentation

## 2021-06-11 DIAGNOSIS — I6389 Other cerebral infarction: Secondary | ICD-10-CM

## 2021-06-11 DIAGNOSIS — D61810 Antineoplastic chemotherapy induced pancytopenia: Secondary | ICD-10-CM | POA: Diagnosis present

## 2021-06-11 DIAGNOSIS — I82431 Acute embolism and thrombosis of right popliteal vein: Secondary | ICD-10-CM | POA: Diagnosis present

## 2021-06-11 DIAGNOSIS — Z96652 Presence of left artificial knee joint: Secondary | ICD-10-CM | POA: Diagnosis present

## 2021-06-11 DIAGNOSIS — Z7901 Long term (current) use of anticoagulants: Secondary | ICD-10-CM

## 2021-06-11 DIAGNOSIS — T451X5A Adverse effect of antineoplastic and immunosuppressive drugs, initial encounter: Secondary | ICD-10-CM | POA: Diagnosis present

## 2021-06-11 DIAGNOSIS — C7800 Secondary malignant neoplasm of unspecified lung: Secondary | ICD-10-CM | POA: Diagnosis present

## 2021-06-11 DIAGNOSIS — R29701 NIHSS score 1: Secondary | ICD-10-CM | POA: Diagnosis present

## 2021-06-11 DIAGNOSIS — C7802 Secondary malignant neoplasm of left lung: Secondary | ICD-10-CM

## 2021-06-11 DIAGNOSIS — E785 Hyperlipidemia, unspecified: Secondary | ICD-10-CM | POA: Diagnosis present

## 2021-06-11 DIAGNOSIS — Z79899 Other long term (current) drug therapy: Secondary | ICD-10-CM

## 2021-06-11 DIAGNOSIS — Z66 Do not resuscitate: Secondary | ICD-10-CM | POA: Diagnosis present

## 2021-06-11 DIAGNOSIS — E876 Hypokalemia: Secondary | ICD-10-CM | POA: Diagnosis not present

## 2021-06-11 DIAGNOSIS — Z820 Family history of epilepsy and other diseases of the nervous system: Secondary | ICD-10-CM

## 2021-06-11 DIAGNOSIS — N179 Acute kidney failure, unspecified: Secondary | ICD-10-CM | POA: Diagnosis present

## 2021-06-11 DIAGNOSIS — R7303 Prediabetes: Secondary | ICD-10-CM | POA: Diagnosis present

## 2021-06-11 DIAGNOSIS — Z823 Family history of stroke: Secondary | ICD-10-CM

## 2021-06-11 LAB — CBC WITH DIFFERENTIAL/PLATELET
Abs Immature Granulocytes: 0.06 10*3/uL (ref 0.00–0.07)
Basophils Absolute: 0 10*3/uL (ref 0.0–0.1)
Basophils Relative: 0 %
Eosinophils Absolute: 0 10*3/uL (ref 0.0–0.5)
Eosinophils Relative: 0 %
HCT: 24.9 % — ABNORMAL LOW (ref 36.0–46.0)
Hemoglobin: 8.1 g/dL — ABNORMAL LOW (ref 12.0–15.0)
Immature Granulocytes: 1 %
Lymphocytes Relative: 3 %
Lymphs Abs: 0.2 10*3/uL — ABNORMAL LOW (ref 0.7–4.0)
MCH: 32.9 pg (ref 26.0–34.0)
MCHC: 32.5 g/dL (ref 30.0–36.0)
MCV: 101.2 fL — ABNORMAL HIGH (ref 80.0–100.0)
Monocytes Absolute: 0.6 10*3/uL (ref 0.1–1.0)
Monocytes Relative: 12 %
Neutro Abs: 4.3 10*3/uL (ref 1.7–7.7)
Neutrophils Relative %: 84 %
Platelets: 93 10*3/uL — ABNORMAL LOW (ref 150–400)
RBC: 2.46 MIL/uL — ABNORMAL LOW (ref 3.87–5.11)
RDW: 18.6 % — ABNORMAL HIGH (ref 11.5–15.5)
WBC: 5.2 10*3/uL (ref 4.0–10.5)
nRBC: 0.4 % — ABNORMAL HIGH (ref 0.0–0.2)

## 2021-06-11 LAB — TROPONIN I (HIGH SENSITIVITY)
Troponin I (High Sensitivity): 13 ng/L (ref <18)
Troponin I (High Sensitivity): 12 ng/L (ref <18)

## 2021-06-11 LAB — COMPREHENSIVE METABOLIC PANEL
ALT: 21 U/L (ref 0–44)
AST: 34 U/L (ref 15–41)
Albumin: 3.4 g/dL — ABNORMAL LOW (ref 3.5–5.0)
Alkaline Phosphatase: 104 U/L (ref 38–126)
Anion gap: 8 (ref 5–15)
BUN: 10 mg/dL (ref 8–23)
CO2: 24 mmol/L (ref 22–32)
Calcium: 9.2 mg/dL (ref 8.9–10.3)
Chloride: 103 mmol/L (ref 98–111)
Creatinine, Ser: 0.55 mg/dL (ref 0.44–1.00)
GFR, Estimated: >60 mL/min (ref >60)
Glucose, Bld: 146 mg/dL — ABNORMAL HIGH (ref 70–99)
Potassium: 3.7 mmol/L (ref 3.5–5.1)
Sodium: 135 mmol/L (ref 135–145)
Total Bilirubin: 1.1 mg/dL (ref 0.3–1.2)
Total Protein: 7.2 g/dL (ref 6.5–8.1)

## 2021-06-11 LAB — TYPE AND SCREEN
ABO/RH(D): O POS
Antibody Screen: NEGATIVE

## 2021-06-11 LAB — MAGNESIUM: Magnesium: 2.1 mg/dL (ref 1.7–2.4)

## 2021-06-11 LAB — GLUCOSE, CAPILLARY
Glucose-Capillary: 121 mg/dL — ABNORMAL HIGH (ref 70–99)
Glucose-Capillary: 144 mg/dL — ABNORMAL HIGH (ref 70–99)

## 2021-06-11 IMAGING — MR MR HEAD W/ CM
4 series · 48 of 48 positions shown · IV contrast (gadavist)
Comparison: Prior noncontrast brain MRI from earlier the same day.

CLINICAL DATA: Initial evaluation for staging, evaluate for
intracranial metastatic disease.

EXAM:
MRI HEAD WITH CONTRAST
TECHNIQUE: Multiplanar, multiecho pulse sequences of the brain and surrounding
structures were obtained with intravenous contrast.
CONTRAST:  5mL GADAVIST GADOBUTROL 1 MMOL/ML IV SOLN

[Series 6: T2 post-contrast · coronal · 5.0mm · 0.57mm/px · 6 of 28 slices shown]
[im 1/28]
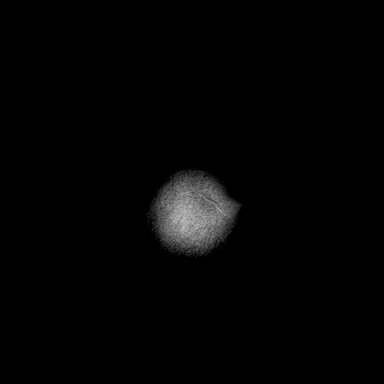
[im 6/28]
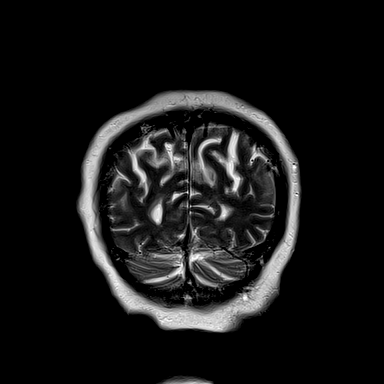
[im 11/28]
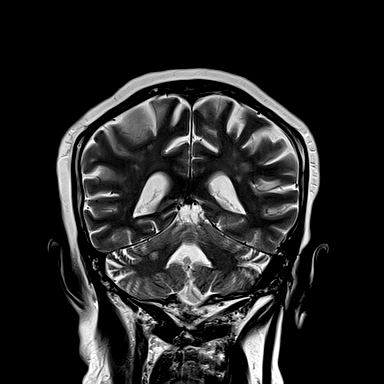
[im 17/28]
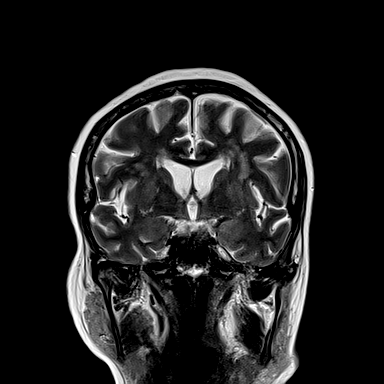
[im 22/28]
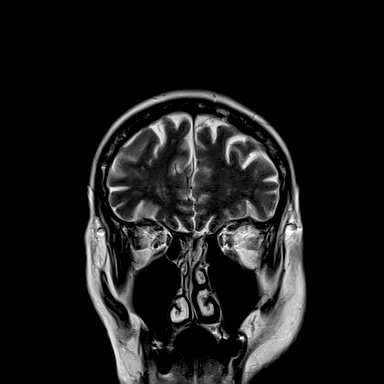
[im 28/28]
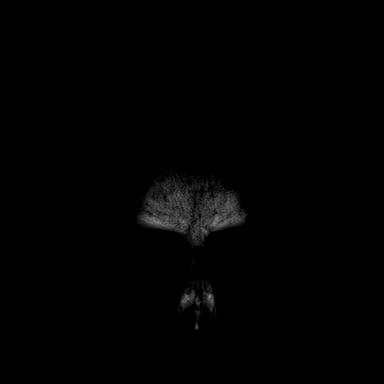

[Series 7: T1 post-contrast · axial · 1.0mm · 0.94mm/px · z∈[-64,+77]mm · 31 of 144 slices shown (1 of 3)]
[im 1/144]
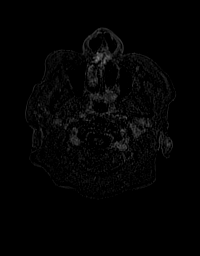
[im 5/144]
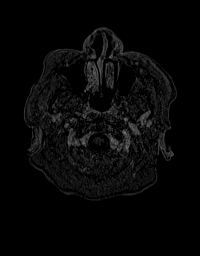
[im 10/144]
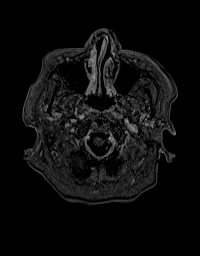
[im 15/144]
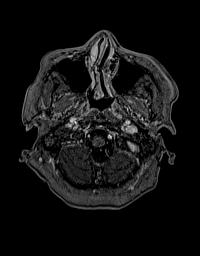
[im 20/144]
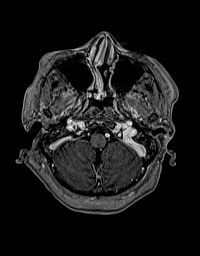
[im 24/144]
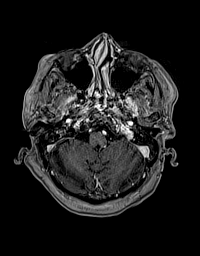
[im 29/144]
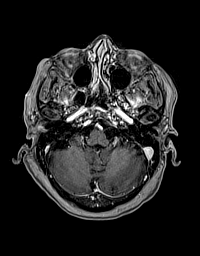
[im 34/144]
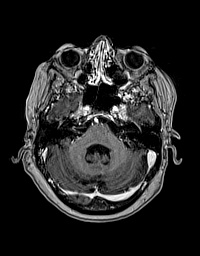
[im 39/144]
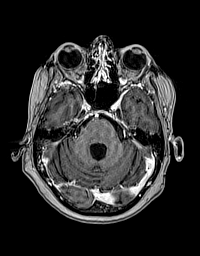
[im 43/144]
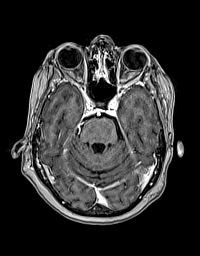
[im 48/144]
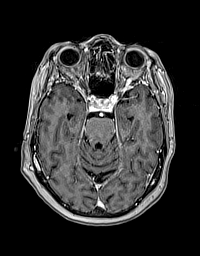
[im 53/144]
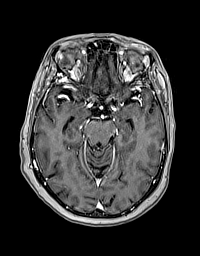
[im 58/144]
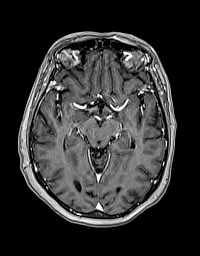
[im 62/144]
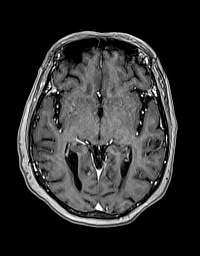
[im 67/144]
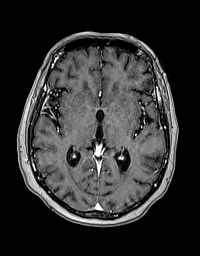
[im 72/144]
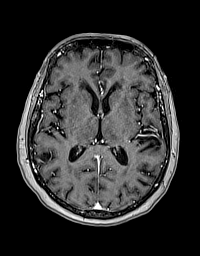
[im 77/144]
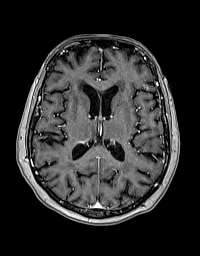
[im 82/144]
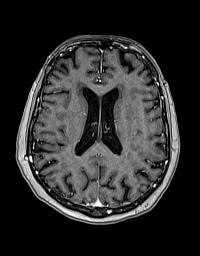
[im 86/144]
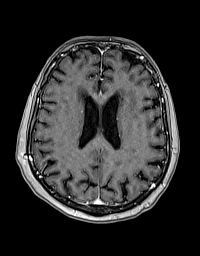
[im 91/144]
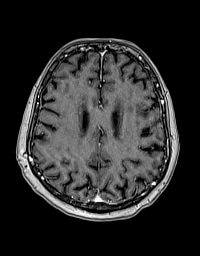
[im 96/144]
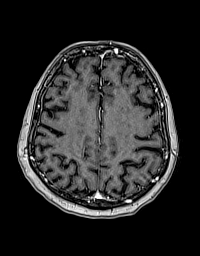
[im 101/144]
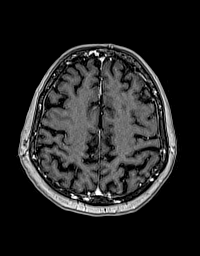
[im 105/144]
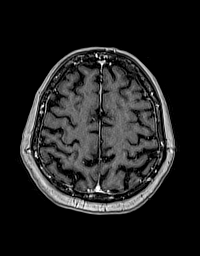
[im 110/144]
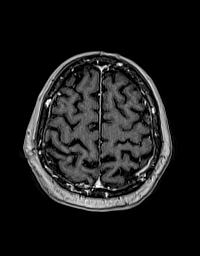
[im 115/144]
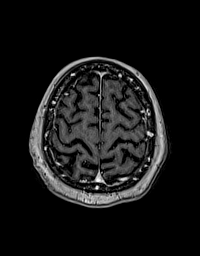
[im 120/144]
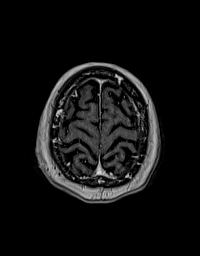
[im 124/144]
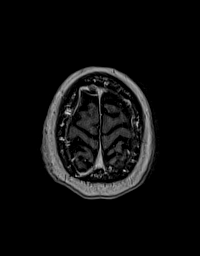
[im 129/144]
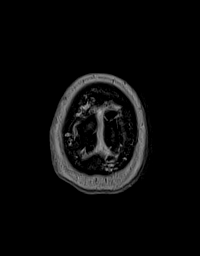
[im 134/144]
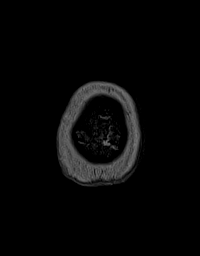
[im 139/144]
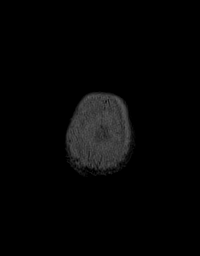
[im 144/144]
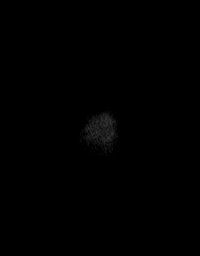

[Series 8: T1 post-contrast · coronal · 5.0mm · 0.43mm/px · 6 of 28 slices shown (2 of 3)]
[im 1/28]
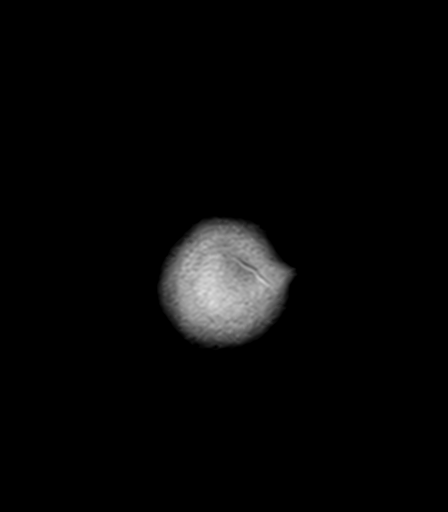
[im 6/28]
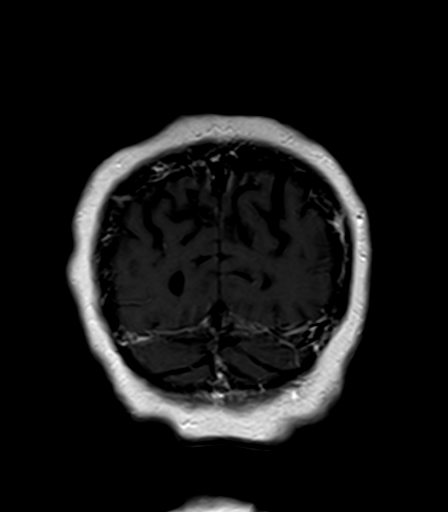
[im 11/28]
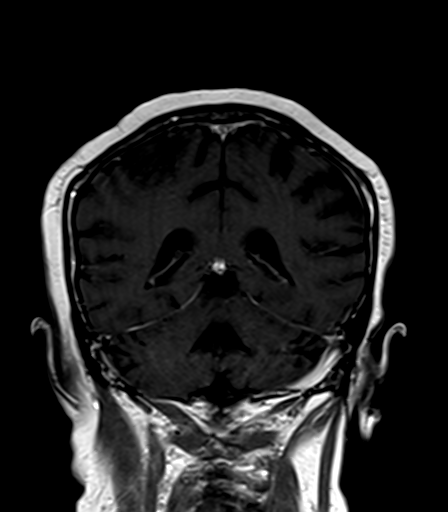
[im 17/28]
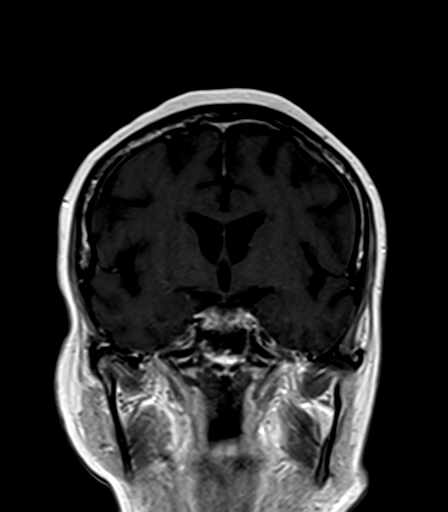
[im 22/28]
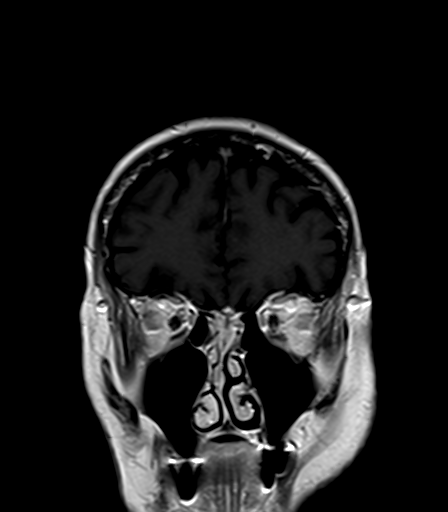
[im 28/28]
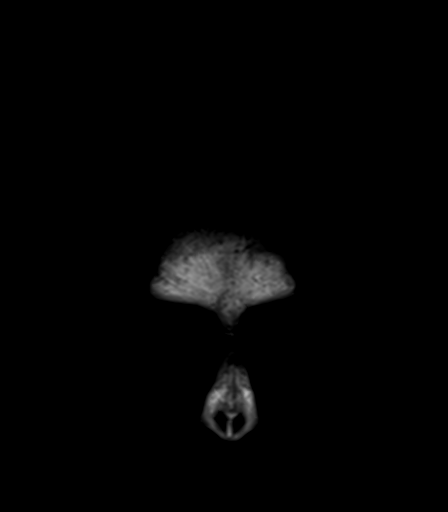

[Series 9: T1 post-contrast · sagittal · 5.0mm · 0.75mm/px · 5 of 24 slices shown (3 of 3)]
[im 1/24]
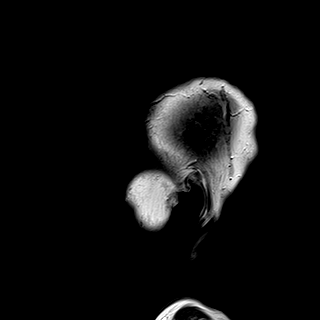
[im 6/24]
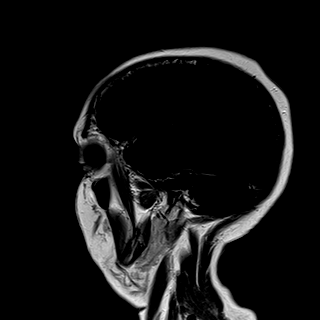
[im 12/24]
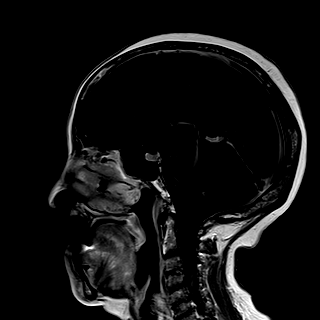
[im 18/24]
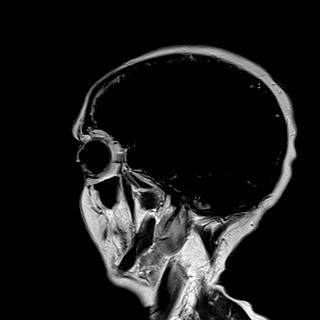
[im 24/24]
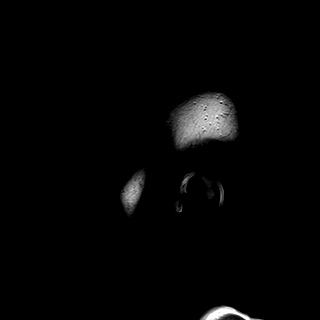

[48 of 48 positions shown; findings below may reference images not displayed]

FINDINGS: Brain: Postcontrast imaging of the brain demonstrates no abnormal
enhancement or evidence for intracranial metastatic disease. Atrophy
with chronic microvascular ischemic disease again noted. No mass
lesion, mass effect, or midline shift. No hydrocephalus or
extra-axial fluid collection. Pituitary gland and suprasellar region
normal. Midline structures intact and normal.

Vascular: Normal intravascular enhancement seen throughout the
brain.

Skull and upper cervical spine: Craniocervical junction within
normal limits. Bone marrow signal intensity diffusely heterogeneous
without focal marrow replacing lesion. No scalp soft tissue
abnormality.

Sinuses/Orbits: Globes and orbital soft tissues demonstrate no acute
finding or abnormal enhancement. Scattered mucosal thickening noted
within the ethmoidal air cells. Paranasal sinuses are otherwise
largely clear. No significant mastoid effusion.

Other: None.
IMPRESSION: 1. No evidence for intracranial metastatic disease.
2. Atrophy with chronic microvascular ischemic disease.

## 2021-06-11 IMAGING — MR MR HEAD W/O CM
14 series · 48 of 48 positions shown · non-contrast
Comparison: MR head without and with contrast [DATE]

CLINICAL DATA: Seizure, new onset.

EXAM:
MRI HEAD WITHOUT CONTRAST
TECHNIQUE: Multiplanar, multiecho pulse sequences of the brain and surrounding
structures were obtained without intravenous contrast.

[Series 12: DWI · axial · 3.0mm · 1.36mm/px · z∈[-50,+84]mm · 5 of 96 slices shown (1 of 4)]
[im 1/96]
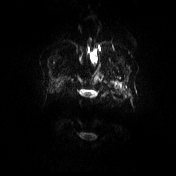
[im 24/96]
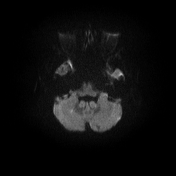
[im 48/96]
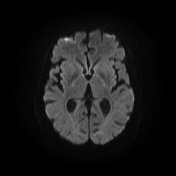
[im 72/96]
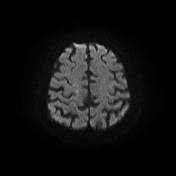
[im 96/96]
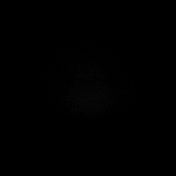

[Series 13: DWI · axial · 3.0mm · 1.36mm/px · z∈[-50,+79]mm · 3 of 46 slices shown (2 of 4)]
[im 1/46]
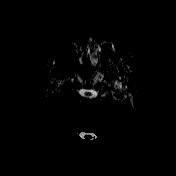
[im 23/46]
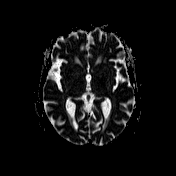
[im 46/46]
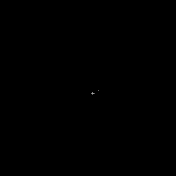

[Series 14: T1 · sagittal · 5.0mm · 0.75mm/px · 1 of 24 slices shown (1 of 3)]
[im 1/24]
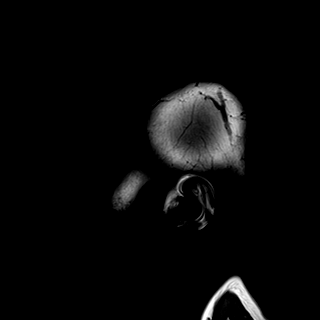

[Series 15: T2 · axial · 3.0mm · 0.57mm/px · z∈[-53,+81]mm · 3 of 48 slices shown (1 of 3)]
[im 1/48]
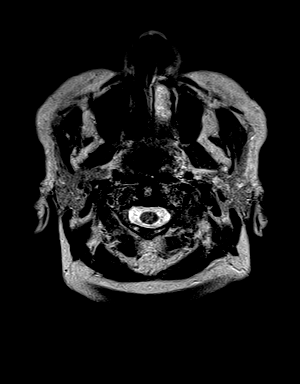
[im 24/48]
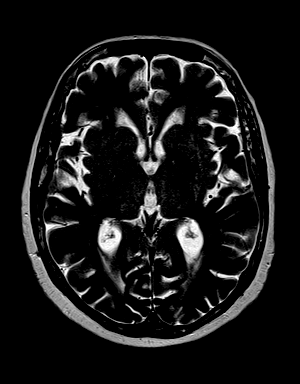
[im 48/48]
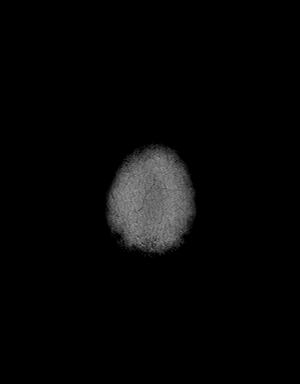

[Series 16: swi_images · axial · 3.0mm · 0.75mm/px · z∈[-50,+84]mm · 3 of 48 slices shown]
[im 1/48]
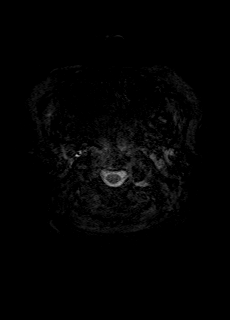
[im 24/48]
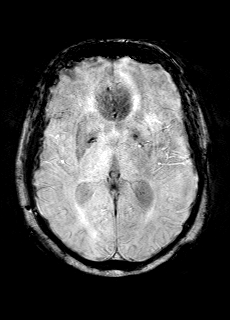
[im 48/48]
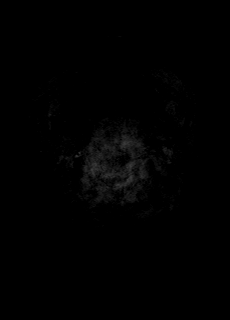

[Series 18: FLAIR · axial · 3.0mm · 0.75mm/px · z∈[-50,+84]mm · 3 of 48 slices shown (1 of 3)]
[im 1/48]
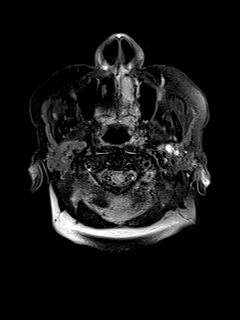
[im 24/48]
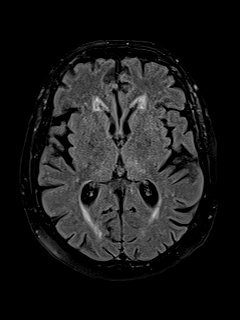
[im 48/48]
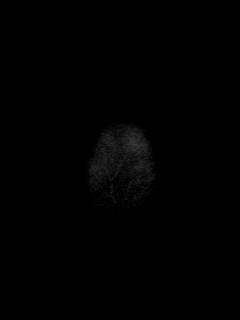

[Series 19: T1 · axial · 1.0mm · 0.94mm/px · z∈[-55,+82]mm · 8 of 144 slices shown (2 of 3)]
[im 1/144]
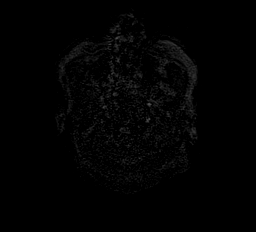
[im 21/144]
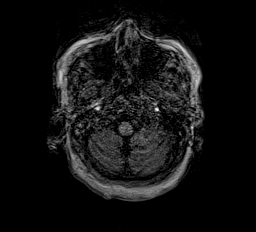
[im 41/144]
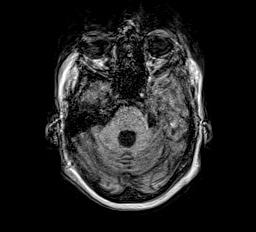
[im 62/144]
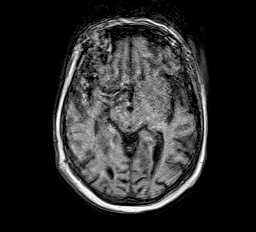
[im 82/144]
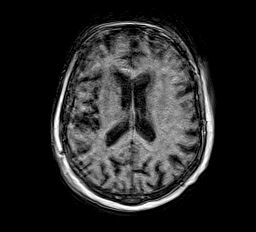
[im 103/144]
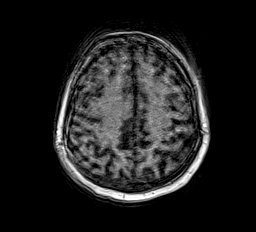
[im 123/144]
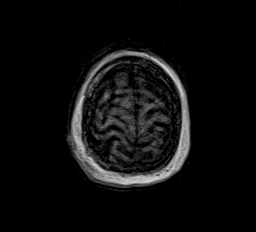
[im 144/144]
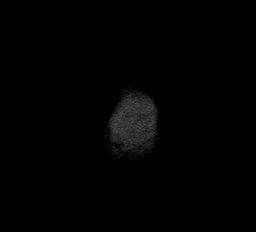

[Series 20: T2 · coronal · 3.0mm · 0.40mm/px · 2 of 34 slices shown (2 of 3)]
[im 1/34]
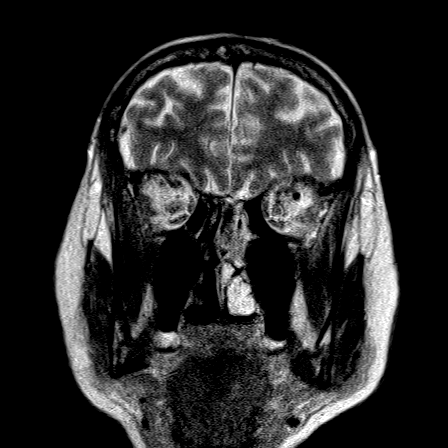
[im 34/34]
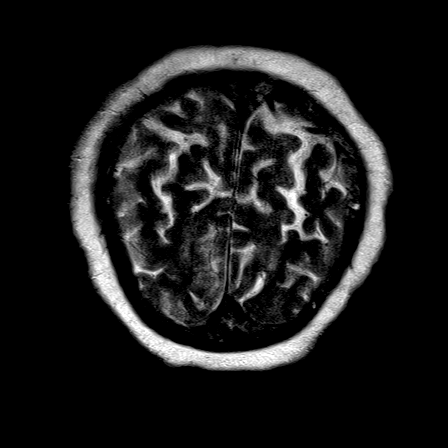

[Series 21: FLAIR · coronal · 3.0mm · 0.56mm/px · 2 of 34 slices shown (2 of 3)]
[im 1/34]
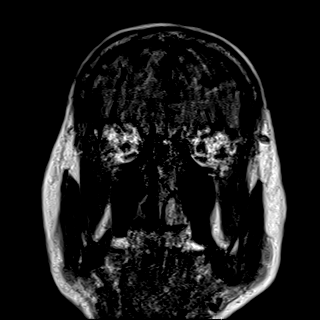
[im 34/34]
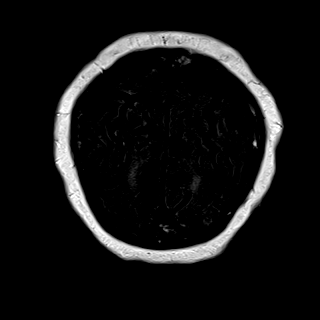

[Series 22: T2 · coronal · 3.0mm · 0.40mm/px · 2 of 34 slices shown (3 of 3)]
[im 1/34]
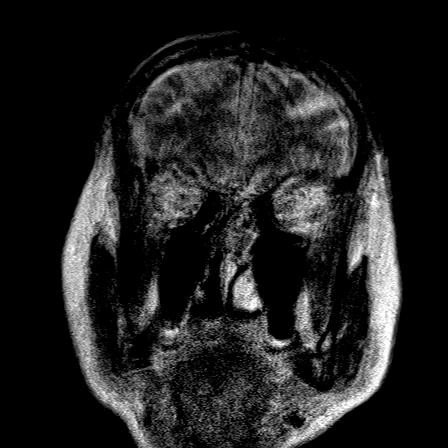
[im 34/34]
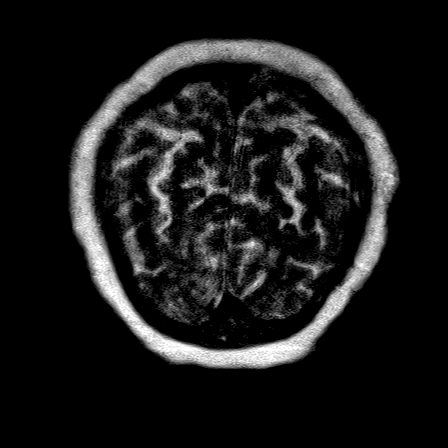

[Series 23: FLAIR · coronal · 3.0mm · 0.56mm/px · 2 of 34 slices shown (3 of 3)]
[im 1/34]
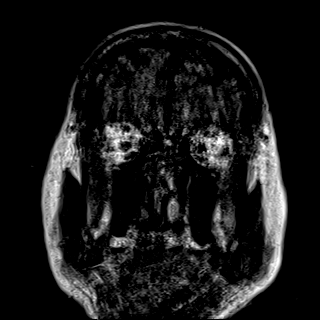
[im 34/34]
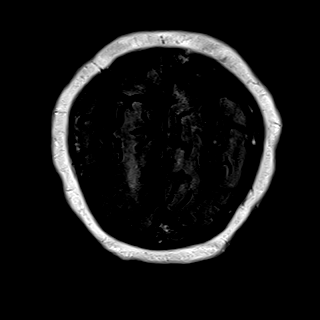

[Series 24: T1 · axial · 1.0mm · 0.94mm/px · z∈[-55,+82]mm · 8 of 144 slices shown (3 of 3)]
[im 1/144]
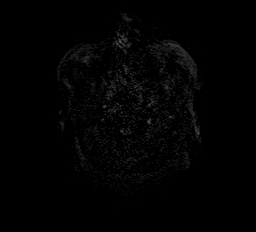
[im 21/144]
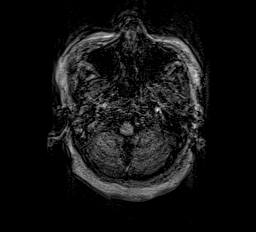
[im 41/144]
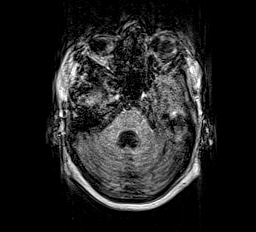
[im 62/144]
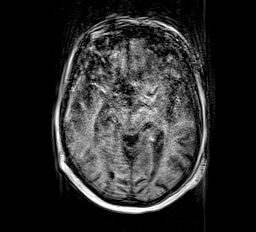
[im 82/144]
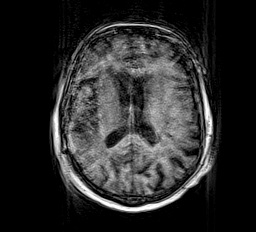
[im 103/144]
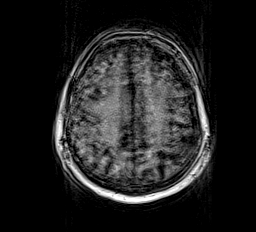
[im 123/144]
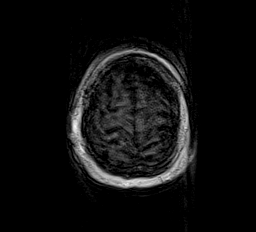
[im 144/144]
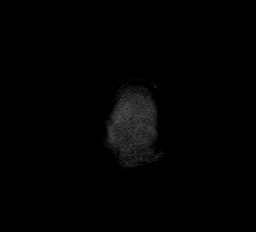

[Series 25: DWI · coronal · 5.0mm · 1.36mm/px · 4 of 72 slices shown (3 of 4)]
[im 1/72]
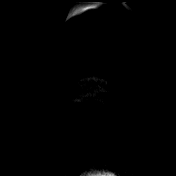
[im 24/72]
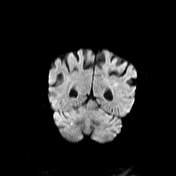
[im 48/72]
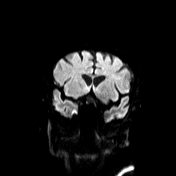
[im 72/72]
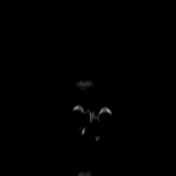

[Series 26: DWI · coronal · 5.0mm · 1.36mm/px · 2 of 36 slices shown (4 of 4)]
[im 1/36]
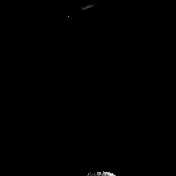
[im 36/36]
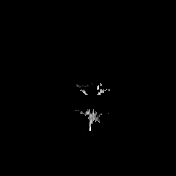

[48 of 48 positions shown; findings below may reference images not displayed]

FINDINGS: Brain: Acute nonhemorrhagic 4 mm infarct is present within the
posterior right middle cerebellar peduncle.

Acute nonhemorrhagic infarct in the high posterior left frontal lobe
measures up to 10 mm on the coronal images.

New T2 hyperintensities are associated with both infarcts.
Periventricular and subcortical T2 hyperintensities are otherwise
stable, moderately advanced for age. Moderate generalized atrophy is
present. The ventricles are of normal size. White matter changes
extend into the brainstem. No significant extraaxial fluid
collection is present.

Vascular: Flow is present in the major intracranial arteries.

Skull and upper cervical spine: The craniocervical junction is
normal. Upper cervical spine is within normal limits. Marrow signal
is unremarkable.

Sinuses/Orbits: The paranasal sinuses and mastoid air cells are
clear. The globes and orbits are within normal limits.
IMPRESSION: 1. Acute nonhemorrhagic 4 mm infarct in the posterior right middle
cerebellar peduncle.
2. Acute nonhemorrhagic 10 mm infarct in the high posterior left
frontal lobe.
3. Stable atrophy and white matter disease is moderately advanced
for age. This likely reflects the sequela of chronic microvascular
ischemia.

## 2021-06-11 IMAGING — CT CT ANGIO CHEST
2 of 7 series · 17 of 46 positions shown · IV contrast (APPLIED)
Comparison: [DATE]

CLINICAL DATA: History of endometrial cancer, diffuse metastatic
disease

EXAM:
CT ANGIOGRAPHY CHEST WITH CONTRAST
TECHNIQUE: Multidetector CT imaging of the chest was performed using the
standard protocol during bolus administration of intravenous
contrast. Multiplanar CT image reconstructions and MIPs were
obtained to evaluate the vascular anatomy.

[Series 5: thins · axial · 0.73mm/px · z∈[-228,-24]mm · 15 of 234 slices shown]
[im 15/234  lung]
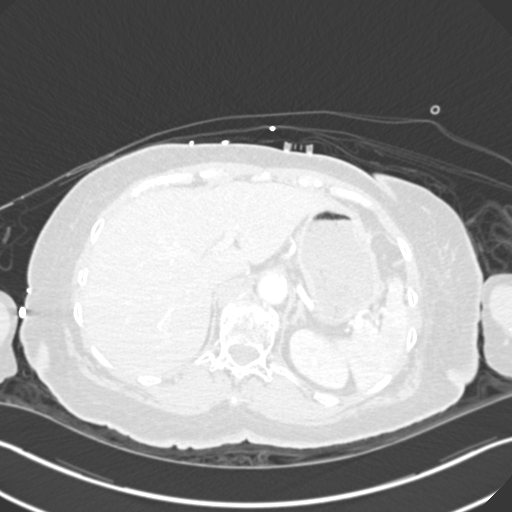
[im 30/234  soft-tissue]
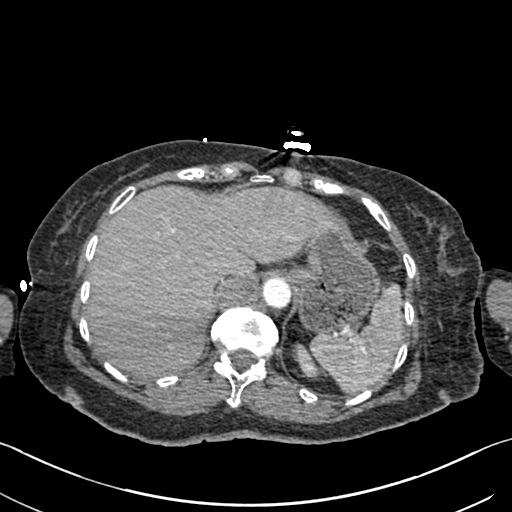
[im 44/234  lung]
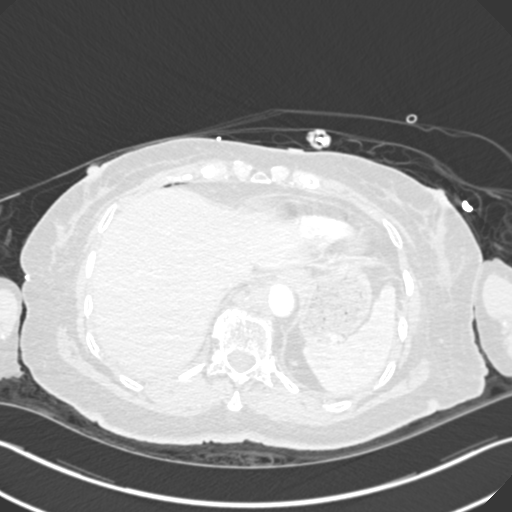
[im 59/234  soft-tissue]
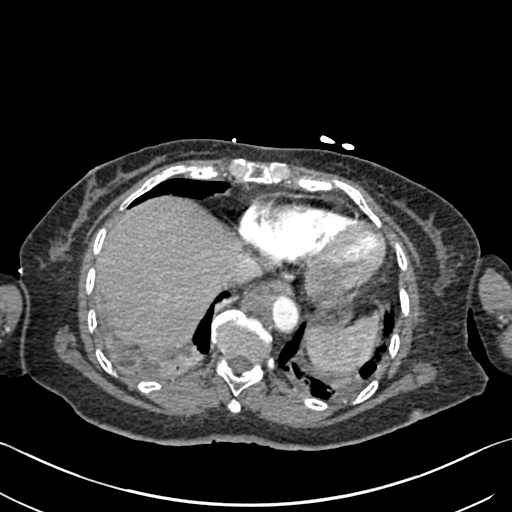
[im 73/234  lung]
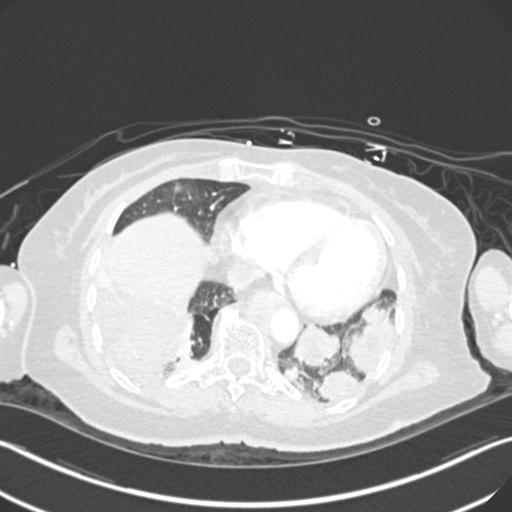
[im 88/234  soft-tissue]
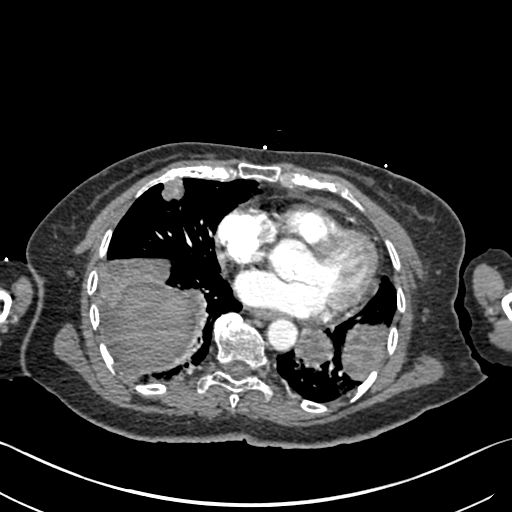
[im 102/234  lung]
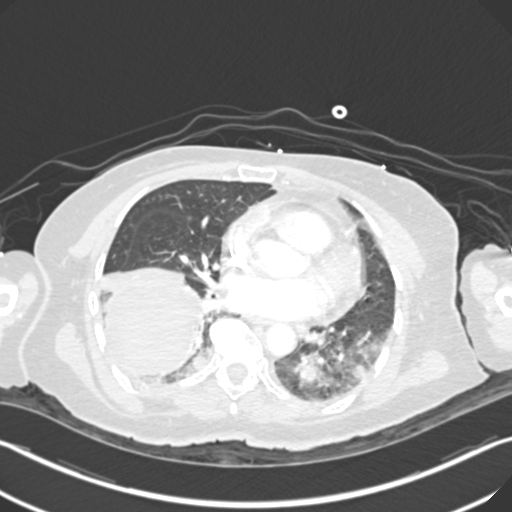
[im 117/234  soft-tissue]
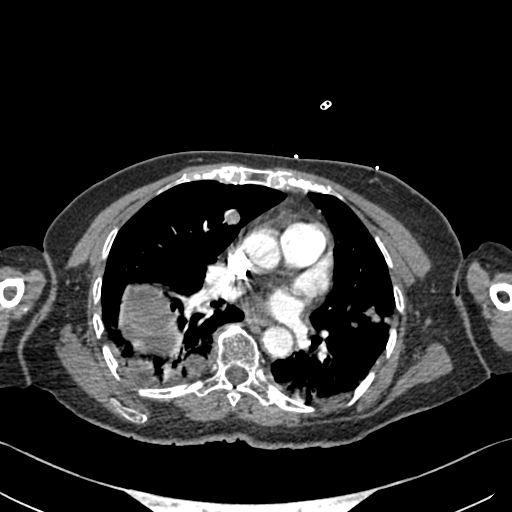
[im 132/234  lung]
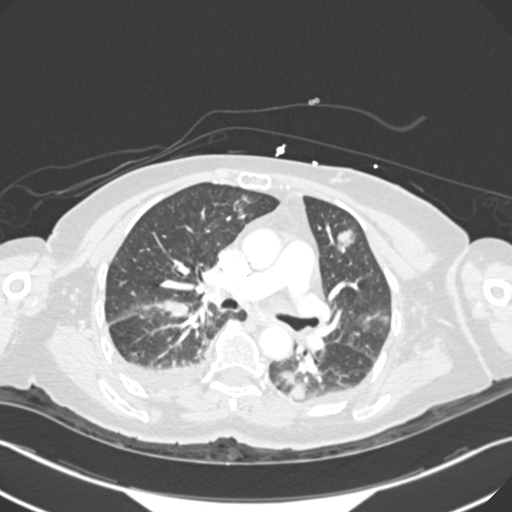
[im 146/234  soft-tissue]
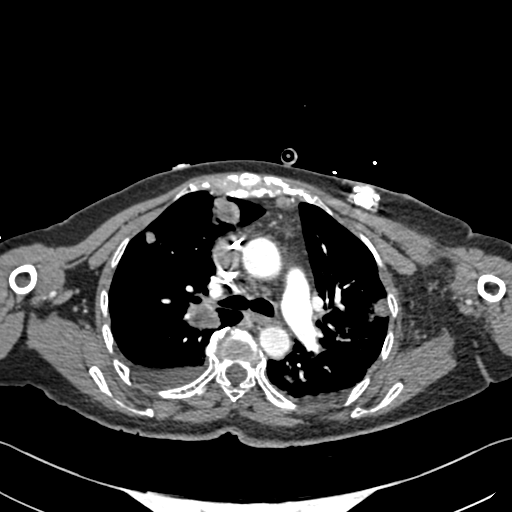
[im 161/234  lung]
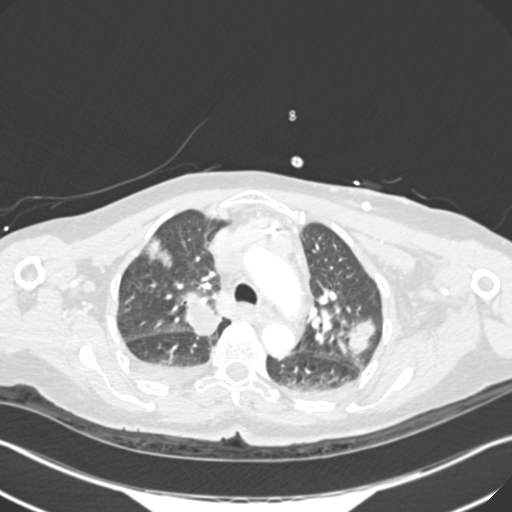
[im 175/234  soft-tissue]
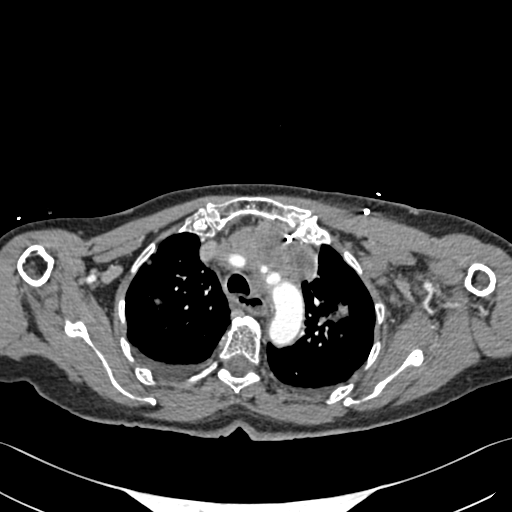
[im 190/234  lung]
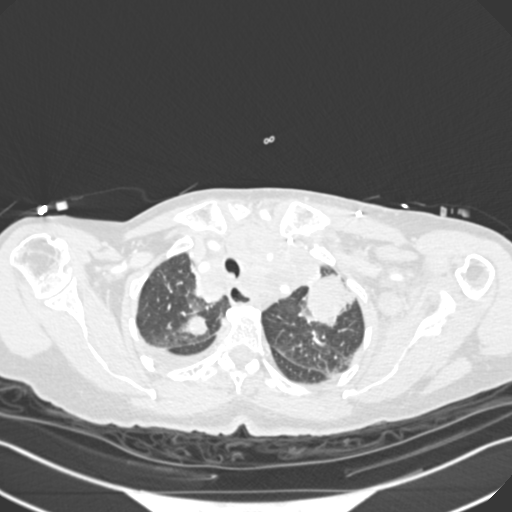
[im 204/234  soft-tissue]
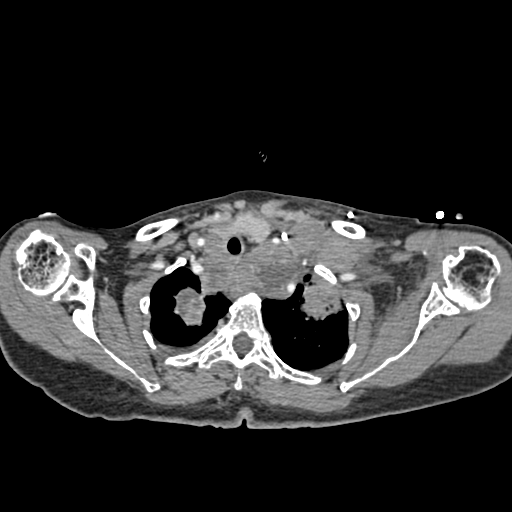
[im 219/234  lung]
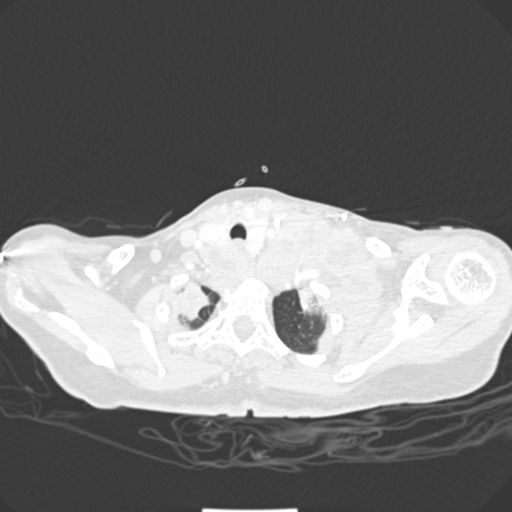

[Series 7: coronal mpr · coronal · 0.49mm/px · 2 of 79 slices shown]
[im 27/79  soft-tissue]
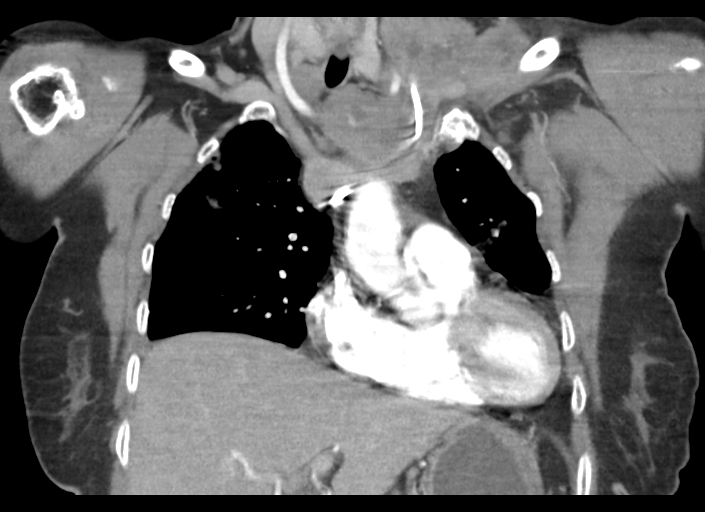
[im 53/79  soft-tissue]
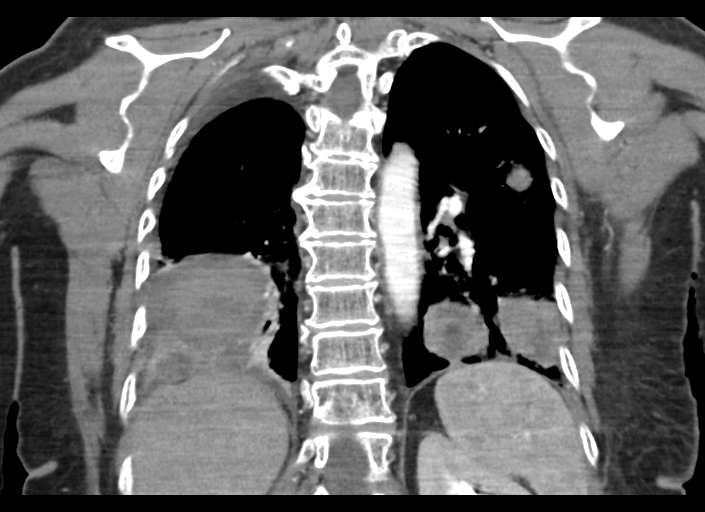

[17 of 46 positions shown; findings below may reference images not displayed]

RADIATION DOSE REDUCTION: This exam was performed according to the
departmental dose-optimization program which includes automated
exposure control, adjustment of the mA and/or kV according to
patient size and/or use of iterative reconstruction technique.

CONTRAST:  100mL OMNIPAQUE IOHEXOL 350 MG/ML SOLN
FINDINGS: Cardiovascular: This is a technically adequate evaluation of the
pulmonary vasculature. No filling defects or pulmonary emboli.

The heart is unremarkable without pericardial effusion. No evidence
of thoracic aortic aneurysm or dissection.

Mediastinum/Nodes: There is diffuse lymphadenopathy seen within the
supraclavicular region, left axilla, mediastinum, and hila, which
has progressed since prior exam. Left supraclavicular lymph node
mass measures 6.1 x 4.0 cm, previously measuring 5.1 x 2.2 cm.
Anterior mediastinal soft tissue mass surrounding the origin of the
great vessels now measures 7.0 x 4.1 cm, previously measuring 6.0 by
4.0 cm.

The the thyroid and trachea are grossly unremarkable. There is
rightward deviation of the trachea due to the lymphadenopathy. There
is likely extrinsic compression of the upper thoracic esophagus due
to the lymph node mass as well.

Lungs/Pleura: Marked progression of the multiple pulmonary nodules
and masses seen previously. Index lesion in the left upper lobe
image [DATE] measures 3.8 x 3.4 cm, previously having measured 3.0 x
2.3 cm. Chole sing lesions are seen within the lung bases. New and
enlarging pleural base masses are seen within the posterior
costophrenic angles. There are trace bilateral pleural effusions not
appreciably changed. No pneumothorax.

Upper Abdomen: Enlarging retrocrural adenopathy on the right.
Remainder of the upper abdomen is unremarkable.

Musculoskeletal: No acute displaced fractures. No destructive bony
lesions. Reconstructed images demonstrate no additional findings.

Review of the MIP images confirms the above findings.
IMPRESSION: 1. No evidence of pulmonary embolus.
2. Progressive metastatic disease, with new and enlarging
lymphadenopathy throughout the supraclavicular region, left axilla,
mediastinum, hila, and right retrocrural region. Likely extrinsic
mass effect upon the upper thoracic esophagus by the anterior
mediastinal lymph node mass.
3. Progressive pulmonary metastatic disease, with new and enlarging
pleural and parenchymal masses as above.
4. Stable trace bilateral pleural effusions.

## 2021-06-11 IMAGING — CR DG CHEST 2V
2 series · 2 of 2 positions shown · non-contrast
Comparison: [DATE]

CLINICAL DATA: Productive cough.

EXAM:
CHEST - 2 VIEW

[w chest lat]
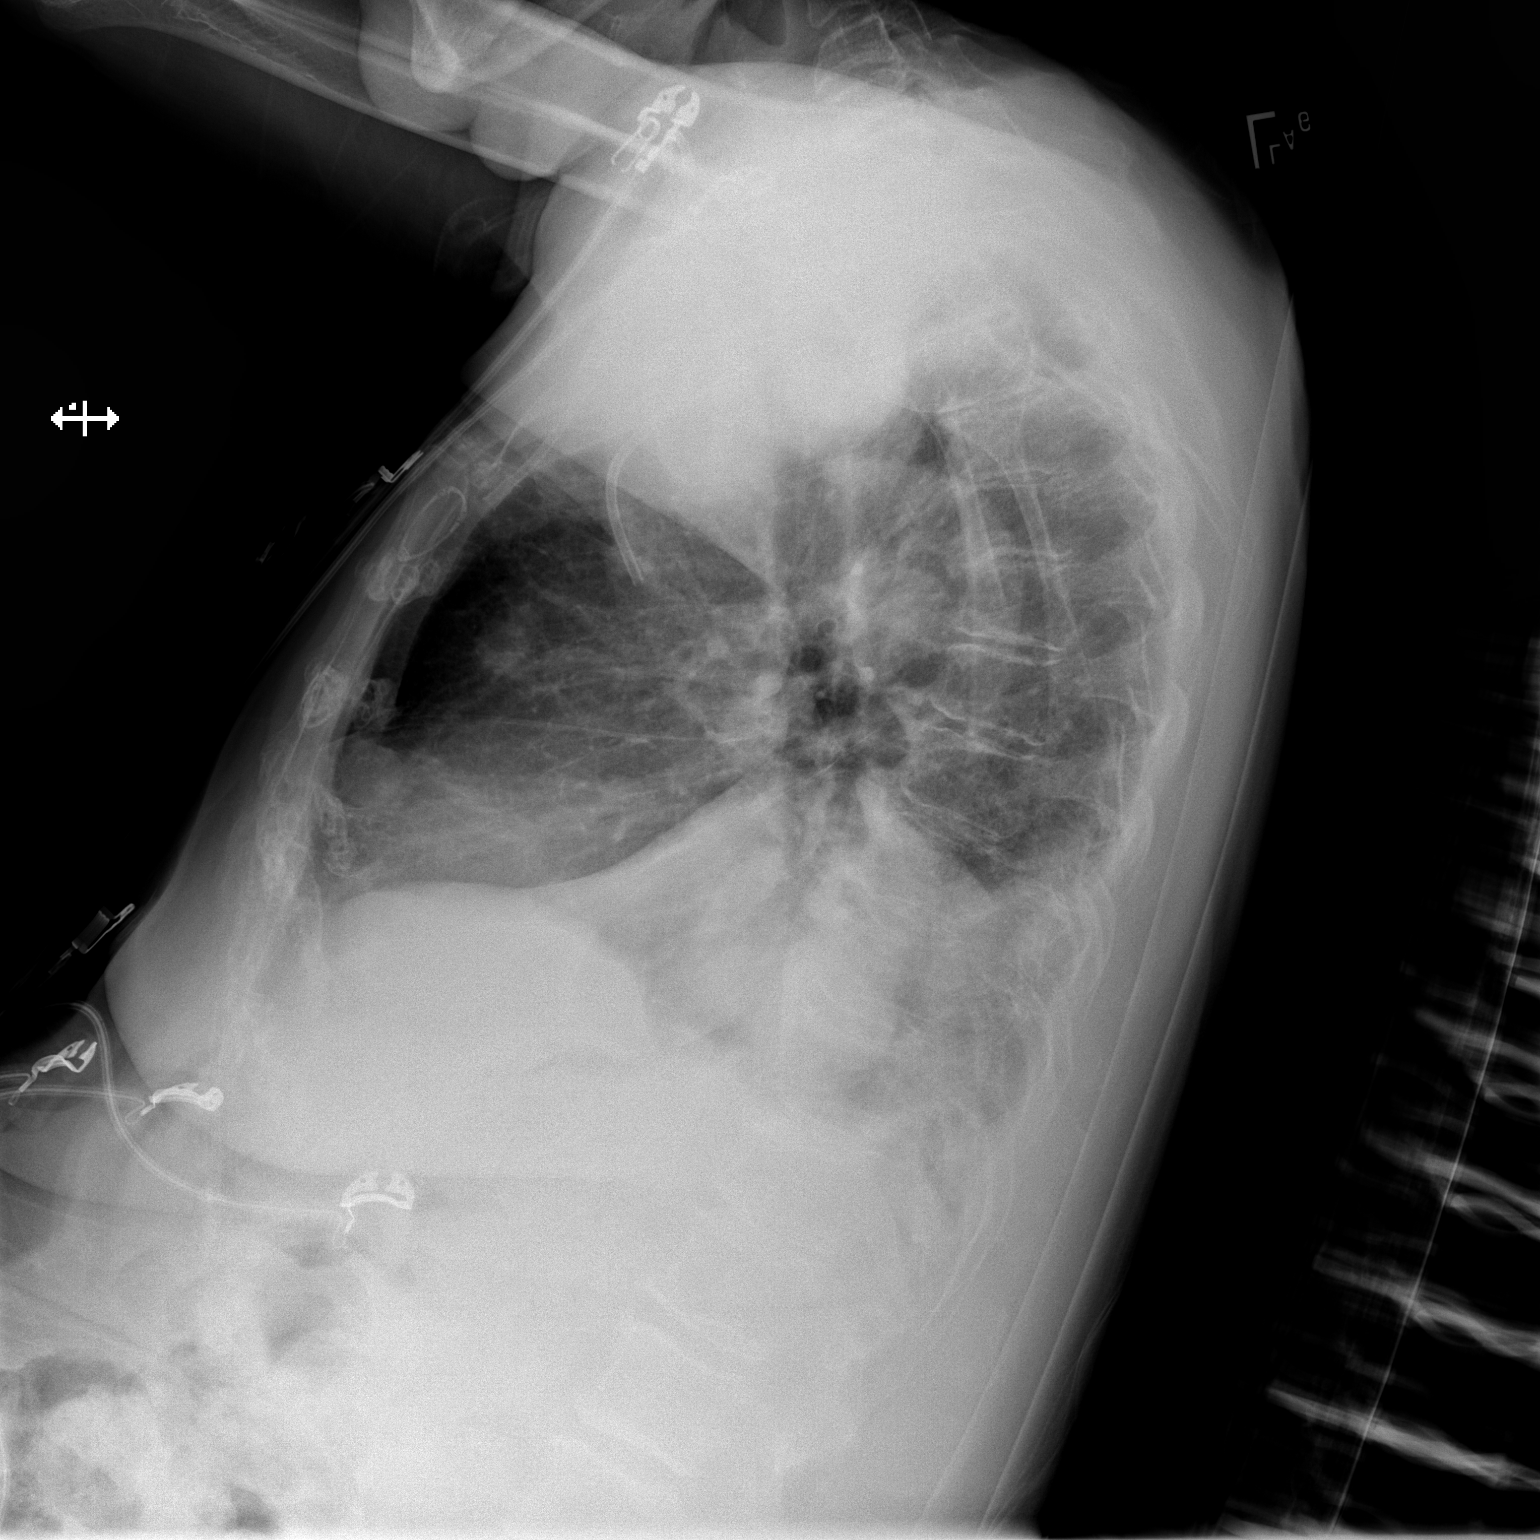

[x chest ap]
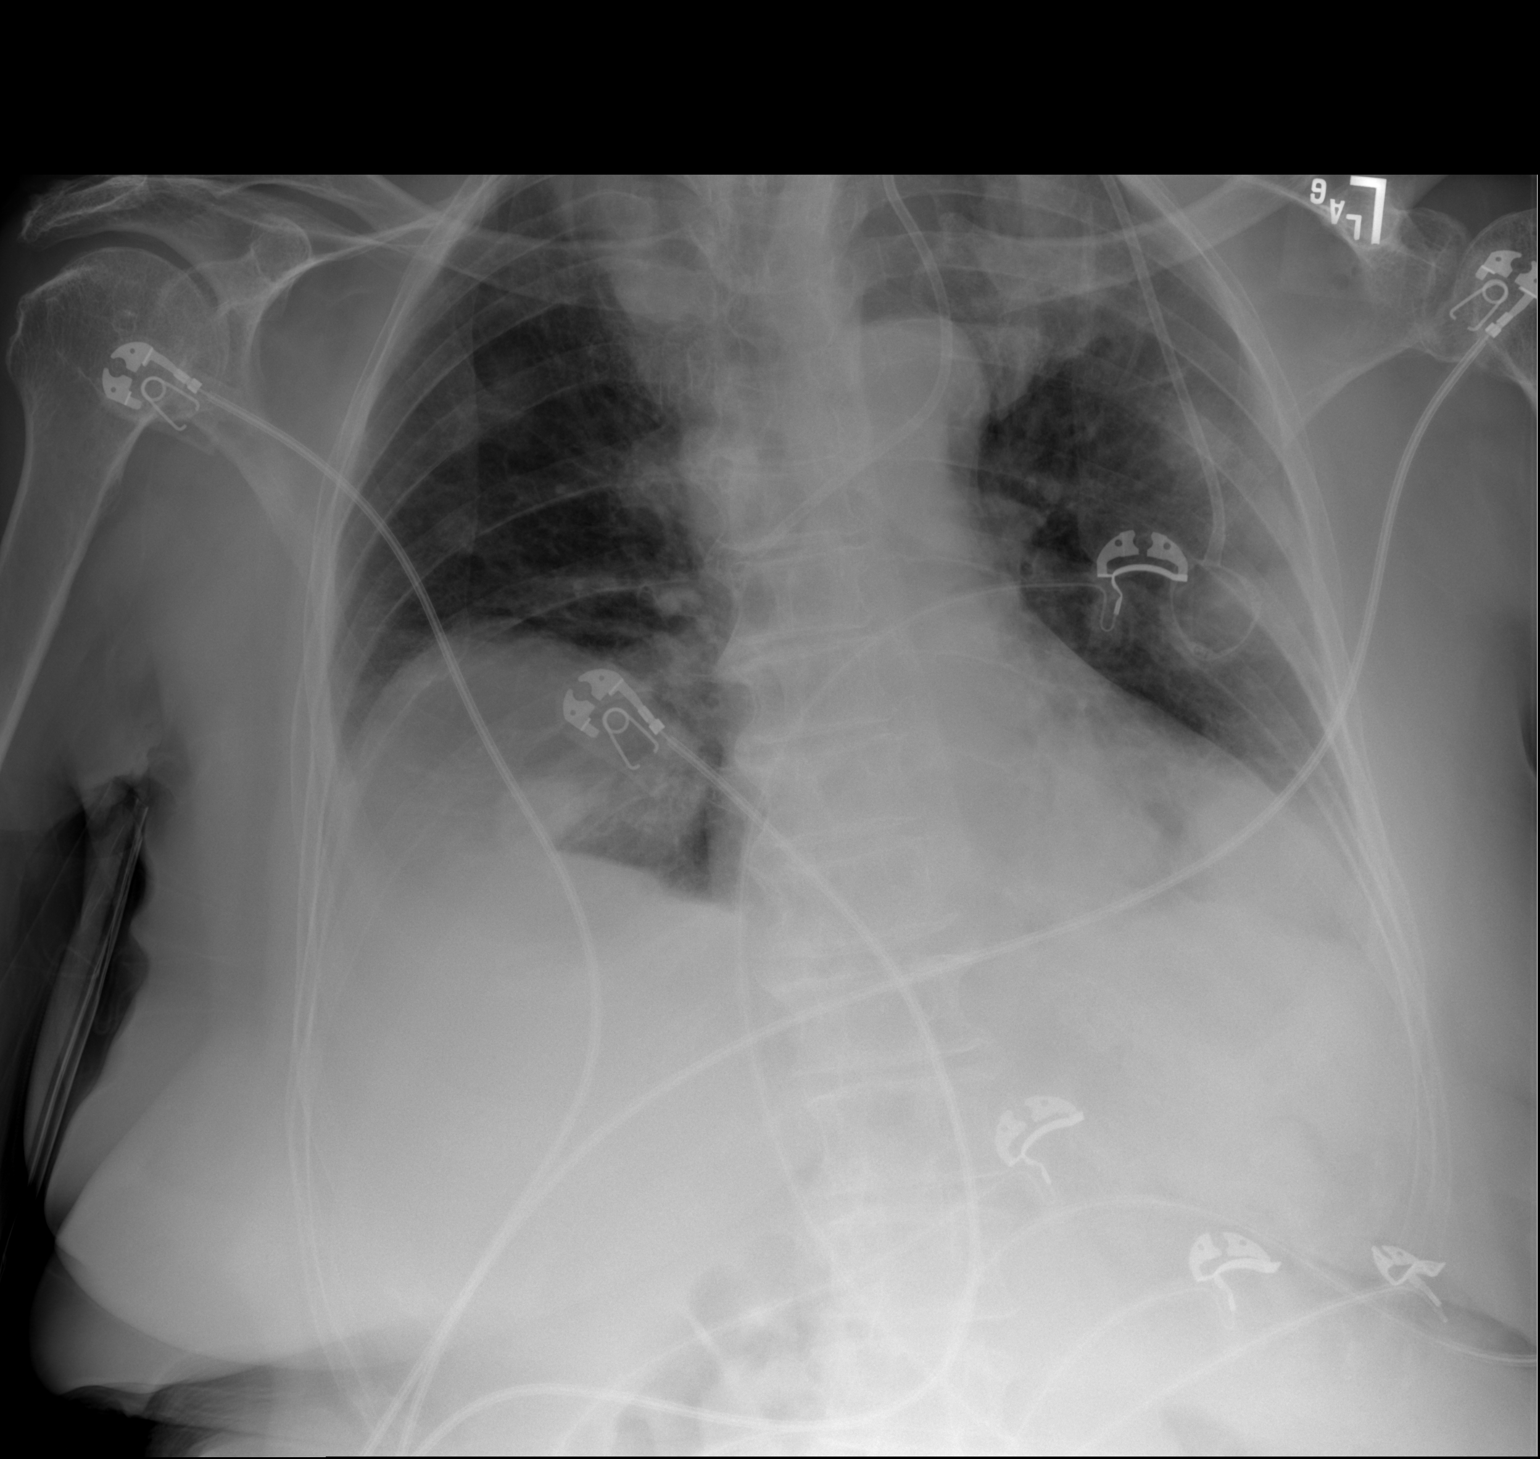

[2 of 2 positions shown; findings below may reference images not displayed]

FINDINGS: There is stable left-sided venous Port-A-Cath positioning. Multiple
bilateral lung masses of various sizes are noted. There is no
evidence of acute infiltrate, pleural effusion or pneumothorax. The
heart size and mediastinal contours are within normal limits.
Multilevel degenerative changes seen throughout the thoracic spine.
IMPRESSION: Multiple bilateral lung masses consistent with pulmonary metastasis.

## 2021-06-11 MED ORDER — LACTATED RINGERS IV SOLN: INTRAVENOUS | Status: DC

## 2021-06-11 MED ORDER — LACTATED RINGERS IV SOLN: Freq: Once | INTRAVENOUS | Status: AC

## 2021-06-11 MED ORDER — LORAZEPAM 2 MG/ML IJ SOLN
0.5000 mg | Freq: Once | INTRAMUSCULAR | Status: AC
  Administered 2021-06-11: 0.5 mg via INTRAVENOUS
  Filled 2021-06-11: qty 1

## 2021-06-11 MED ORDER — PANTOPRAZOLE SODIUM 40 MG IV SOLR
40.0000 mg | Freq: Two times a day (BID) | INTRAVENOUS | Status: DC
  Administered 2021-06-11 – 2021-06-14 (×7): 40 mg via INTRAVENOUS
  Filled 2021-06-11 (×7): qty 10

## 2021-06-11 MED ORDER — METOPROLOL TARTRATE 5 MG/5ML IV SOLN: 5.0000 mg | Freq: Four times a day (QID) | INTRAVENOUS | Status: DC | PRN

## 2021-06-11 MED ORDER — PROCHLORPERAZINE EDISYLATE 10 MG/2ML IJ SOLN
10.0000 mg | Freq: Four times a day (QID) | INTRAMUSCULAR | Status: DC | PRN
  Administered 2021-06-11: 10 mg via INTRAVENOUS
  Filled 2021-06-11: qty 2

## 2021-06-11 MED ORDER — ASPIRIN 325 MG PO TABS
325.0000 mg | ORAL_TABLET | Freq: Every day | ORAL | Status: DC
  Administered 2021-06-11: 325 mg via ORAL
  Filled 2021-06-11: qty 1

## 2021-06-11 MED ORDER — GADOBUTROL 1 MMOL/ML IV SOLN
5.0000 mL | Freq: Once | INTRAVENOUS | Status: AC | PRN
  Administered 2021-06-11: 5 mL via INTRAVENOUS

## 2021-06-11 MED ORDER — LORAZEPAM 2 MG/ML IJ SOLN: 2.0000 mg | INTRAMUSCULAR | Status: DC | PRN

## 2021-06-11 MED ORDER — LATANOPROST 0.005 % OP SOLN
1.0000 [drp] | Freq: Every day | OPHTHALMIC | Status: DC
  Administered 2021-06-12 – 2021-06-13 (×3): 1 [drp] via OPHTHALMIC
  Filled 2021-06-11 (×2): qty 2.5

## 2021-06-11 MED ORDER — STROKE: EARLY STAGES OF RECOVERY BOOK
Freq: Once | Status: AC
  Filled 2021-06-11: qty 1

## 2021-06-11 MED ORDER — SODIUM CHLORIDE 0.9% FLUSH
3.0000 mL | Freq: Two times a day (BID) | INTRAVENOUS | Status: DC
  Administered 2021-06-12 – 2021-06-14 (×5): 3 mL via INTRAVENOUS

## 2021-06-11 MED ORDER — SODIUM CHLORIDE 0.9 % IV BOLUS
500.0000 mL | Freq: Once | INTRAVENOUS | Status: AC
  Administered 2021-06-11: 500 mL via INTRAVENOUS

## 2021-06-11 MED ORDER — PANTOPRAZOLE SODIUM 40 MG IV SOLR
40.0000 mg | Freq: Once | INTRAVENOUS | Status: DC
  Filled 2021-06-11: qty 10

## 2021-06-11 MED ORDER — IOHEXOL 350 MG/ML SOLN
100.0000 mL | Freq: Once | INTRAVENOUS | Status: AC | PRN
  Administered 2021-06-11: 100 mL via INTRAVENOUS

## 2021-06-11 NOTE — Consult Note (Signed)
Neurology Consultation ?Reason for Consult: Strokes on MRI ?Requesting Physician: Cherylann Ratel ? ?CC: Syncope ? ?History is obtained from: Patient, family at bedside, and chart review ? ?HPI: Kristen Freeman is a 71 y.o. 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 ? ?Patient reports she has been in her normal state of health.  She had been having hemoptysis related to her lung metastases which had improved after palliative radiation to the lungs.  Given her IVC thrombus which was not felt to be candidate for filter or surgery, oncology recently restarted Eliquis.  Per family she is taking half of the tablet twice daily (2.5 mg dose). ? ?She had gone to bed as normal last night after eating dinner.  She woke up at 1:30 in the morning feeling diaphoretic and with pain in the left cheek and eye, feeling hot and sweaty particularly in that area of her face.  She called out to her family member and felt like she was going to throw up, subsequently losing consciousness.  Her family member reports that on the family members arrival to the patient's room, she was sitting up and vomiting and then fell headfirst to the floor like a rag doll.  She was completely unresponsive and dead weight.  They helped get the patient back to the bed and she came to within a few minutes, with no confusion after this event.  She has been having some intermittent eye pain left eye greater than the right as well as pain in her teeth.  ? ?Subsequently today while in the hospital she again had symptoms of the left side of her face feeling hot and sweaty and losing consciousness again.  Her daughter who was at bedside notes that she was shaking both of her arms and had a glazed straight forward fixed gaze.  She again came to and had normal mental status  immediately on regaining consciousness ? ?She also reports her right hearing aid has not been working well which she is unsure if that is secondary to the new hearing aid not being calibrated properly versus new loss of hearing in the right ear.  She additionally had an episode of left foot swelling on Thursday which resolved with elevation and has not recurred nor caused any pain.  Otherwise her review of systems is negative ? ?LKW: 4/15 evening ?tPA given?: No, on Eliquis at time of admission ?Premorbid modified rankin scale: 1-2 ?    1 - No significant disability. Able to carry out all usual activities, despite some symptoms. ?    2 - Slight disability. Able to look after own affairs without assistance, but unable to carry out all previous activities. ? ?ROS: All other review of systems was negative except as noted in the HPI.  ? ?Past Medical History:  ?Diagnosis Date  ? Endometrial cancer ([place])   ? dx 2019 tx'd; recurrence 12/22 - XRT x 30, Chem 03/21/21 to 04/25/21  ? Glaucoma   ? HLD (hyperlipidemia)   ? on statin  ? HTN (hypertension)   ? ?Past Surgical History:  ?Procedure Laterality Date  ? TM repair N/A   ? TONSILLECTOMY    ? TOTAL ABDOMINAL HYSTERECTOMY W/ BILATERAL SALPINGOOPHORECTOMY    ? TOTAL KNEE ARTHROPLASTY Left   ? ?Current Outpatient Medications  ?Medication Instructions  ? acetaminophen (TYLENOL) 500 mg, Oral, Every 6 hours PRN  ?  atorvastatin (LIPITOR) 10 mg, Oral, Daily  ? bisacodyl (BISACODYL) 5 mg, Oral, Daily  ? Calcium Carb-Cholecalciferol (CALCIUM + D3 PO) 1 tablet, Oral, Daily with breakfast  ? Eliquis 5 mg, Oral, 2 times daily  ? famotidine (PEPCID) 20 mg, Oral, Daily PRN  ? folic acid (FOLVITE) 1 mg, Oral, Daily  ? Glycerin-Polysorbate 80 (REFRESH DRY EYE THERAPY OP) 1 drop, Both Eyes, 2 times daily PRN  ? latanoprost (XALATAN) 0.005 % ophthalmic solution 1 drop, Both Eyes, Daily at bedtime  ? metoprolol tartrate (LOPRESSOR) 50 mg, Oral, 2 times daily  ? MIRALAX 17 GM/SCOOP powder  Measure 17 g of powder and mix in 4 oz of juice or water and drink by mouth daily, or as needed for regular bowel movements  ? prochlorperazine (COMPAZINE) 10 mg, Oral, 4 times daily PRN  ? ? ? ?Family History  ?Problem Relation Age of Onset  ? Alzheimer's disease Mother   ? Diabetes Father   ? Prostate cancer Father   ? Hypertension Sister   ? Diabetes Sister   ? Breast cancer Sister   ? Hypertension Sister   ? Diabetes Sister   ? CVA Brother   ? Alcoholism Brother   ? ? ?Social History:  reports that she has never smoked. She has never used smokeless tobacco. She reports that she does not currently use alcohol. She reports that she does not use drugs. ? ? ?Exam: ?Current vital signs: ?BP (!) 142/84 (BP Location: Right Arm)   Pulse (!) 109   Temp 98.2 ?F (36.8 ?C) (Oral)   Resp 20   Ht '4\' 11"'$  (1.499 m)   Wt 54 kg   SpO2 97%   BMI 24.04 kg/m?  ?Vital signs in last 24 hours: ?Temp:  [98.1 ?F (36.7 ?C)-98.7 ?F (37.1 ?C)] 98.2 ?F (36.8 ?C) (04/16 1112) ?Pulse Rate:  [106-117] 109 (04/16 1112) ?Resp:  [18-23] 20 (04/16 1112) ?BP: (124-142)/(80-90) 142/84 (04/16 1112) ?SpO2:  [97 %-99 %] 97 % (04/16 1112) ?Weight:  [54 kg] 54 kg (04/16 0203) ? ? ?Physical Exam  ?Constitutional: Appears slightly thin, no acute distress ?Psych: Affect appropriate to situation, calm and cooperative ?Eyes: No scleral injection ?HENT: No oropharyngeal obstruction.  ?MSK: no joint deformities.  ?Cardiovascular: Tachycardic, perfusing extremities well ?Respiratory: Effort normal, non-labored breathing.  Slight intermittent cough ?GI: Soft.  No distension. There is no tenderness.  ?Skin: Warm dry and intact visible skin.  No edema ? ?Neuro: ?Mental Status: ?Patient is awake, alert, oriented to person, place, month, year, and situation. ?Patient is able to give a clear and coherent history. ?No signs of aphasia or neglect ?Cranial Nerves: ?II: Visual Fields are full. Pupils are equal, round, and reactive to light.   ?III,IV, VI: EOMI  without ptosis or diploplia.  ?V: Facial sensation is symmetric to light touch ?VII: Facial movement is symmetric.  ?VIII: hearing is intact to voice ?X: Uvula elevates symmetrically ?XI: Shoulder shrug is symmetric. ?XII: tongue is midline without atrophy or fasciculations.  ?Motor: ?Tone is normal. Bulk is normal. 5/5 strength was present in all four extremities.  No pronator drift ?Sensory: ?Sensation is symmetric to light touch in the arms and legs. ?Deep Tendon Reflexes: ?2+ and symmetric in the brachioradialis and patellae.  ?Plantars: ?Toes are downgoing bilaterally.  ?Cerebellar: ?FNF and HKS are intact bilaterally ?Gait:  ?Deferred in acute setting  ? ?NIHSS total 0, ?Performed at 3:45 PM  ? ? ?I have reviewed labs in epic and the results pertinent to this  consultation are: ? ?Basic Metabolic Panel: ?Recent Labs  ?Lab 06/05/21 ?0830 06/11/21 ?0414  ?NA 133* 135  ?K 3.8 3.7  ?CL 103 103  ?CO2 24 24  ?GLUCOSE 159* 146*  ?BUN 11 10  ?CREATININE 0.55 0.55  ?CALCIUM 8.6* 9.2  ? ?CBC: ?Recent Labs  ?Lab 06/05/21 ?0830 06/11/21 ?0414  ?WBC 2.7* 5.2  ?NEUTROABS 2.2 4.3  ?HGB 8.2* 8.1*  ?HCT 24.6* 24.9*  ?MCV 97.2 101.2*  ?PLT 74* 93*  ? ? ?Coagulation Studies: ?No results for input(s): LABPROT, INR in the last 72 hours.  ? ? ?I have reviewed the images obtained: ?MRI brain personally reviewed  ?1. Acute nonhemorrhagic 4 mm infarct in the posterior right middle cerebellar peduncle. ?2. Acute nonhemorrhagic 10 mm infarct in the high posterior left frontal lobe. ?3. Stable atrophy and white matter disease is moderately advanced for age. This likely reflects the sequela of chronic microvascular ischemia. ? ?MRI brain w/wo 05/24/2021 negative for intracranial mets ? ?Impression: This is a 71 year old woman presenting with new onset stereotyped events of syncope preceded by left face sensations of heat/diaphoresis.  MRI shows new strokes, likely in the setting of hypercoagulability from malignancy versus intracardiac  clot.  Patient should undergo stroke work-up as below and start on aspirin until anticoagulation is resumed.  No explanation for left facial stereotyped events on the MRI brain thus far, therefore I think it is prudent to repeat

## 2021-06-11 NOTE — Progress Notes (Signed)
Kristen Freeman at [facility] notified of Kristen Freeman arrival and pt transport.  Pt's family members are enroute.  They took pt's belongings with them stating all it was was the clothes she wore to the ED. ?Kristen Freeman BSN RN CMSRN ?06/11/2021, 9:51 PM ? ?

## 2021-06-11 NOTE — Progress Notes (Signed)
Report given to [place] with [facility].  ETA is about 15 minutes. ?Ayesha Mohair BSN RN CMSRN ?06/11/2021, 9:07 PM ? ?

## 2021-06-11 NOTE — Progress Notes (Signed)
Notified that the patient had another event lasting about 30 seconds with similar semiology of left face heated sensation, followed by loss of consciousness with immediate return to baseline on recovery. Patient otherwise remains clinically stable. She is stable for MRI. On arrival at [facility], would recommend long-term EEG monitoring for spell capture given the frequency of these events and lack of clinical clarity of diagno sis. ? ?No charge same day note

## 2021-06-11 NOTE — ED Notes (Signed)
Pt ambulatory to restroom w/ x1 assist by visitor for comfort.  ?

## 2021-06-11 NOTE — H&P (Addendum)
?History and Physical  ? ? ?Patient: Kristen Freeman LKG:[id number] DOB: 24-Jul-1950 ?DOA: 06/11/2021 ?DOS: the patient was seen and examined on 06/11/2021 ?PCP: Allie Dimmer, MD  ?Patient coming from: Home ? ?Chief Complaint:  ?Chief Complaint  ?Patient presents with  ? Cough  ? Syncopal Episode  ? ?HPI: Kristen Freeman is a 71 y.o. female with medical history significant of endometrial cancer w/ mets, IVC thrombosis, pancytopenia, glaucoma, HTN, HLD, hemoptysis. Presenting with syncope. History is from patient and daughter. The patient had an episode of hot flash last night. It made her feel nauseous. She called for her sister to come help her. After that, the next thing she remembered was her family standing over her while she was lying in bed. Apparently she had passed out. When her sister got to her, she was slumped over and had vomited. She noted blood in her vomit. Her family picked her up and moved her to her bed. She was out for a few minutes. No tonic-clonic activity was noted. No post-ictal period observed. They called for EMS. They deny any other aggravating or alleviating factors.   ? ?Review of Systems: As mentioned in the history of present illness. All other systems reviewed and are negative. ?Past Medical History:  ?Diagnosis Date  ? Endometrial cancer ([place])   ? dx 2019 tx'd; recurrence 12/22 - XRT x 30, Chem 03/21/21 to 04/25/21  ? Glaucoma   ? HLD (hyperlipidemia)   ? on statin  ? HTN (hypertension)   ? ?Past Surgical History:  ?Procedure Laterality Date  ? TM repair N/A   ? TONSILLECTOMY    ? TOTAL ABDOMINAL HYSTERECTOMY W/ BILATERAL SALPINGOOPHORECTOMY    ? TOTAL KNEE ARTHROPLASTY Left   ? ?Social History:  reports that she has never smoked. She has never used smokeless tobacco. She reports that she does not currently use alcohol. She reports that she does not use drugs. ? ?Allergies  ?Allergen Reactions  ? Latex Rash and Other (See Comments)  ?  Band-Aids are not tolerated  ? Tape Rash  ? ? ?Family  History  ?Problem Relation Age of Onset  ? Alzheimer's disease Mother   ? Diabetes Father   ? Prostate cancer Father   ? Hypertension Sister   ? Diabetes Sister   ? Breast cancer Sister   ? Hypertension Sister   ? Diabetes Sister   ? CVA Brother   ? Alcoholism Brother   ? ? ?Prior to Admission medications   ?Medication Sig Start Date End Date Taking? Authorizing Provider  ?acetaminophen (TYLENOL) 500 MG tablet Take 500 mg by mouth every 6 (six) hours as needed for mild pain or headache.   Yes [provider]  ?apixaban (ELIQUIS) 5 MG TABS tablet Take 1 tablet (5 mg total) by mouth 2 (two) times daily. ?Patient taking differently: Take 2.5 mg by mouth 2 (two) times daily. 06/05/21  Yes Gorsuch, Ni, MD  ?atorvastatin (LIPITOR) 10 MG tablet Take 1 tablet (10 mg total) by mouth daily. 06/02/21 07/02/21 Yes Elodia Florence., MD  ?bisacodyl 5 MG EC tablet Take 5 mg by mouth daily.   Yes [provider]  ?Calcium Carb-Cholecalciferol (CALCIUM + D3 PO) Take 1 tablet by mouth daily with breakfast.   Yes [provider]  ?famotidine (PEPCID) 20 MG tablet Take 1 tablet (20 mg total) by mouth daily as needed for heartburn or indigestion. 06/02/21 08/01/21 Yes Elodia Florence., MD  ?folic acid (FOLVITE) 1 MG tablet Take 1 tablet (  1 mg total) by mouth daily. 06/03/21 07/03/21 Yes Elodia Florence., MD  ?Glycerin-Polysorbate 80 (REFRESH DRY EYE THERAPY OP) Place 1 drop into both eyes 2 (two) times daily as needed (for dryness).   Yes [provider]  ?latanoprost (XALATAN) 0.005 % ophthalmic solution Place 1 drop into both eyes at bedtime. 03/26/21  Yes [provider]  ?metoprolol tartrate (LOPRESSOR) 50 MG tablet Take 1 tablet (50 mg total) by mouth 2 (two) times daily. 06/02/21 07/02/21 Yes Elodia Florence., MD  ?Merril Abbe 17 GM/SCOOP powder Measure 17 g of powder and mix in 4 oz of juice or water and drink by mouth daily, or as needed for regular bowel movements 06/02/21  Yes  Elodia Florence., MD  ?prochlorperazine (COMPAZINE) 10 MG tablet Take 10 mg by mouth 4 (four) times daily as needed for nausea/vomiting. 03/21/21  Yes [provider]  ? ? ?Physical Exam: ?Vitals:  ? 06/11/21 0204 06/11/21 0330 06/11/21 0613 06/11/21 0730  ?BP:  131/85 133/85 124/80  ?Pulse:  (!) 107 (!) 109 (!) 111  ?Resp: 18 20 (!) 22 (!) 23  ?Temp: 98.1 ?F (36.7 ?C)  98.2 ?F (36.8 ?C)   ?TempSrc:   Oral   ?SpO2:  98% 99% 99%  ?Weight:      ?Height:      ? ?General: 71 y.o. female resting in bed in NAD ?Eyes: PERRL, normal sclera ?ENMT: Nares patent w/o discharge, orophaynx clear, dentition normal, ears w/o discharge/lesions/ulcers ?Neck: Supple, trachea midline ?Cardiovascular: RRR, +S1, S2, no g/r, 1/6 SEM, equal pulses throughout ?Respiratory: soft scattered wheeze, no r/r, normal WOB on RA ?GI: BS+, NDNT, no masses noted, no organomegaly noted ?MSK: No e/c/c ?Neuro: A&O x 3, no focal deficits ?Psyc: Appropriate interaction and affect, calm/cooperative ? ?Data Reviewed: ? ?Glucose  146 ?Hgb  8.1 ?MCV  101.2 ?Plt  93 ? ?CXR: Multiple bilateral lung masses consistent with pulmonary metastasis. ? ?EKG: sinus tach, no st elevations ? ?Assessment and Plan: ?No notes have been filed under this hospital service. ?Service: Hospitalist ?Syncope ?    - place in obs, tele ?    - check echo, orthostatics ?    - EKG is ok ?    - trp 13 -> 12 ?    - no fevers, she is tachy ?    - BP is ok ?    - fluids ? ?Hematemesis ?    - Hgb stable, trend; she is ok with transfusion if she needs them ?    - PPI ?    - [facility] to see; keep NPO until they do, if no procedure planned, she can have CLD after their review ?    - hold her eliquis ? ?Endometrial CA w/ mets to lung ?Hx of hemoptysis ?    - family reports her hemoptysis has improved; we will hold her eliquis d/t the above ?    - onco added to care team ? 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    - as above ? ?HTN ?    - continue home regimen when off NPO status ?    - PRN  available ? ?HLD ?    - continue home regimen when off NPO status ? ?Pancytopenia ?    - chronic ?    - trending H&H for now ? ?Hyperglycemia ?    - no Hx of DM; check A1c ? ?Glaucoma ?    - continue home  regimen when confirmed ? ?Goals of care ?    - palliative care consult ? ?UPDATE: ?Patient had another syncopal type episode this afternoon. MRI/EEG ordered. MRI positive for acute stroke. Spoke with neuro. Stroke protocol ordered. Neuro to see. Transfer to [facility].  ? ? Advance Care Planning:   Code Status: Full Code ? ?Consults: Oncology, [facility] ? ?Family Communication: w/ daughter at bedside ? ?Severity of Illness: ?The appropriate patient status for this patient is OBSERVATION. Observation status is judged to be reasonable and necessary in order to provide the required intensity of service to ensure the patient's safety. The patient's presenting symptoms, physical exam findings, and initial radiographic and laboratory data in the context of their medical condition is felt to place them at decreased risk for further clinical deterioration. Furthermore, it is anticipated that the patient will be medically stable for discharge from the hospital within 2 midnights of admission.  ? ?Author: ?Jonnie Finner, DO ?06/11/2021 8:08 AM ? ?For on call review [email or web address].  ?

## 2021-06-11 NOTE — Progress Notes (Signed)
Called [facility] [place] to give report '@[id number]'$ , put on hold and was told RN would call back to receive report. Will pass along to night shift RN ? ?Ethlyn Daniels, RN, BSN ? ?

## 2021-06-11 NOTE — Progress Notes (Signed)
Rapid Response Event Note  ? ?Reason for Call :  Hypotension and lethargy ? ? ?Initial Focused Assessment:  ?On arrival, Pt RN & 2nd RN in room. Pt visibly lethargic but arousable to verbal stimuli. BP @ 1957p 76/49 (58). Repeat BP 2001p 89/80 then 99/65 (77). ?Pt able to verbalize name, birthday and nods appropriately but requires constant stimulation to stay awake. ?BS 144 ?HR 112 ?O2 88-93% RA ?Throughout my time w/ the pt her lethargy is slowly improving making me believe she possibly had a another seizure prior to my arrival. Unconfirmed by pt RN or family. According to RN she was not seen shaking like a traditional seizure but she reported feeling HOT prior to to BP drop which aline w/ symptoms seen during previous seizures on day shift. ? ? ? ?Interventions:  ?Helped pt RN transfer pt to CT for a head scan. Transport was uneventful.  ? ?Plan of Care:  ?Keep continuous monitor on BP and O2 until [facility] comes to transfer pt to [facility]. ?This RR RN will continue to check vitals until pt leaves, Pt RN knows how to reach me of pt looks like she is seizing or if BP drops etc.  ? ?Report has been called to [facility] and pt is awaiting [facility]'s arrival ? ?Event Summary:  ?When I left pt vitals - 96% on 2L [place], 154/81 (101) HR 111. Pt still lethargic but arousable to verbal stimuli. ? ?MD Notified: None ?Call Time: 1958p ?Arrival Time: 2006p ?End Time: 2102p ? ?Quincy Simmonds, RN ?

## 2021-06-11 NOTE — Progress Notes (Signed)
Pt now more alert and awake. Provider at bedside , NIH scale completed. See flowsheet. Will continue to monitor , call bell within reach. ?

## 2021-06-11 NOTE — ED Provider Notes (Signed)
?[facility] DEPT ?Provider Note ? ? ?CSN: [id number] ?Arrival date & time: 06/11/21  0148 ? ?  ? ?History ? ?Chief Complaint  ?Patient presents with  ? Cough  ? Syncopal Episode  ? ? ?Kristen Freeman is a 71 y.o. female. ? ?The history is provided by the patient, a relative and medical records.  ?Cough ?Kristen Freeman is a 71 y.o. female who presents to the Emergency Department complaining of syncope.  She presents to the ED for evaluation accompanied by her family for evaluation of vomiting and syncope.  She was sleeping and woke up with nausea got hot and started vomiting.  She passed out after she vomited.  Family provides photos of the emesis, which appears like her meal with a small amount of blood mixed in.  She also has a deep raspy cough, which is a change from her prior cough. ? 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  She was recently admitted for hemoptysis.  She did have lung radiation, which has subsequently decreased the amount of hemoptysis she is experiencing.  No black or bloody stool, last BM was yesterday and it was brown and normal for her.  She reports feeling okay at the time of ED assessment.  Denies any chest pain, abdominal pain or difficulty breathing.  She does state that her voice is slightly more raspy over the last 24 hours.  No difficulty breathing or throat swelling. ?   ?  ? ?Home Medications ?Prior to Admission medications   ?Medication Sig Start Date End Date Taking? Authorizing Provider  ?acetaminophen (TYLENOL) 500 MG tablet Take 500 mg by mouth every 6 (six) hours as needed for mild pain or headache.   Yes [provider]  ?apixaban (ELIQUIS) 5 MG TABS tablet Take 1 tablet (5 mg total) by mouth 2 (two) times daily. ?Patient taking differently: Take 2.5 mg by mouth 2 (two) times daily.  06/05/21  Yes Gorsuch, Ni, MD  ?atorvastatin (LIPITOR) 10 MG tablet Take 1 tablet (10 mg total) by mouth daily. 06/02/21 07/02/21 Yes Elodia Florence., MD  ?bisacodyl 5 MG EC tablet Take 5 mg by mouth daily.   Yes [provider]  ?Calcium Carb-Cholecalciferol (CALCIUM + D3 PO) Take 1 tablet by mouth daily with breakfast.   Yes [provider]  ?famotidine (PEPCID) 20 MG tablet Take 1 tablet (20 mg total) by mouth daily as needed for heartburn or indigestion. 06/02/21 08/01/21 Yes Elodia Florence., MD  ?folic acid (FOLVITE) 1 MG tablet Take 1 tablet (1 mg total) by mouth daily. 06/03/21 07/03/21 Yes Elodia Florence., MD  ?Glycerin-Polysorbate 80 (REFRESH DRY EYE THERAPY OP) Place 1 drop into both eyes 2 (two) times daily as needed (for dryness).   Yes [provider]  ?latanoprost (XALATAN) 0.005 % ophthalmic solution Place 1 drop into both eyes at bedtime. 03/26/21  Yes [provider]  ?metoprolol tartrate (LOPRESSOR) 50 MG tablet Take 1 tablet (50 mg total) by mouth 2 (two) times daily. 06/02/21 07/02/21 Yes Elodia Florence., MD  ?Merril Abbe 17 GM/SCOOP powder Measure 17 g of powder and mix in 4 oz of juice or water and drink by mouth daily, or as needed for regular bowel movements 06/02/21  Yes Elodia Florence., MD  ?prochlorperazine (COMPAZINE) 10 MG tablet Take 10 mg  by mouth 4 (four) times daily as needed for nausea/vomiting. 03/21/21  Yes [provider]  ?   ? ?Allergies    ?Latex and Tape   ? ?Review of Systems   ?Review of Systems  ?Respiratory:  Positive for cough.   ?All other systems reviewed and are negative. ? ?Physical Exam ?Updated Vital Signs ?BP 133/85 (BP Location: Right Arm)   Pulse (!) 109   Temp 98.2 ?F (36.8 ?C) (Oral)   Resp (!) 22   Ht '4\' 11"'$  (1.499 m)   Wt 54 kg   SpO2 99%   BMI 24.04 kg/m?  ?Physical Exam ?Vitals and nursing note reviewed.  ?Constitutional:   ?   Appearance: She is well-developed.  ?HENT:  ?   Head: Normocephalic  and atraumatic.  ?   Comments: No blood in the posterior oropharynx or nares.  There are some small telangiectasia to the right tonsillar pillar. ?Neck:  ?   Comments: Firm mass in the left supraclavicular region. ?Cardiovascular:  ?   Rate and Rhythm: Regular rhythm. Tachycardia present.  ?   Heart sounds: No murmur heard. ?Pulmonary:  ?   Effort: Pulmonary effort is normal. No respiratory distress.  ?   Comments: Occasional crackles in right lung fields.   ?Abdominal:  ?   Palpations: Abdomen is soft.  ?   Tenderness: There is no abdominal tenderness. There is no guarding or rebound.  ?Musculoskeletal:     ?   General: No swelling or tenderness.  ?Skin: ?   General: Skin is warm and dry.  ?Neurological:  ?   Mental Status: She is alert and oriented to person, place, and time.  ?Psychiatric:     ?   Behavior: Behavior normal.  ? ? ?ED Results / Procedures / Treatments   ?Labs ?(all labs ordered are listed, but only abnormal results are displayed) ?Labs Reviewed  ?CBC WITH DIFFERENTIAL/PLATELET - Abnormal; Notable for the following components:  ?    Result Value  ? RBC 2.46 (*)   ? Hemoglobin 8.1 (*)   ? HCT 24.9 (*)   ? MCV 101.2 (*)   ? RDW 18.6 (*)   ? Platelets 93 (*)   ? nRBC 0.4 (*)   ? Lymphs Abs 0.2 (*)   ? All other components within normal limits  ?COMPREHENSIVE METABOLIC PANEL - Abnormal; Notable for the following components:  ? Glucose, Bld 146 (*)   ? Albumin 3.4 (*)   ? All other components within normal limits  ?TYPE AND SCREEN  ?TROPONIN I (HIGH SENSITIVITY)  ?TROPONIN I (HIGH SENSITIVITY)  ? ? ?EKG ?EKG Interpretation ? ?Date/Time:  Sunday June 11 2021 03:11:30 EDT ?Ventricular Rate:  109 ?PR Interval:  160 ?QRS Duration: 66 ?QT Interval:  339 ?QTC Calculation: 457 ?R Axis:   10 ?Text Interpretation: Sinus tachycardia Low voltage, precordial leads Borderline T abnormalities, anterior leads Confirmed by Quintella Reichert [phone]) on 06/11/2021 3:28:21 AM ? ?Radiology ?DG Chest 2 View ? ?Result Date:  06/11/2021 ?CLINICAL DATA:  Productive cough. EXAM: CHEST - 2 VIEW COMPARISON:  May 23, 2021 FINDINGS: There is stable left-sided venous Port-A-Cath positioning. Multiple bilateral lung masses of various sizes are noted. There is no evidence of acute infiltrate, pleural effusion or pneumothorax. The heart size and mediastinal contours are within normal limits. Multilevel degenerative changes seen throughout the thoracic spine. IMPRESSION: Multiple bilateral lung masses consistent with pulmonary metastasis. Electronically Signed   By: Virgina Norfolk M.D.   On: 06/11/2021 03:52   ? ?  Procedures ?Procedures  ? ? ?Medications Ordered in ED ?Medications  ?pantoprazole (PROTONIX) injection 40 mg (has no administration in time range)  ?lactated ringers infusion (has no administration in time range)  ?sodium chloride 0.9 % bolus 500 mL (0 mLs Intravenous Stopped 06/11/21 0554)  ? ? ?ED Course/ Medical Decision Making/ A&P ?  ?                        ?Medical Decision Making ?Amount and/or Complexity of Data Reviewed ?Labs: ordered. ?Radiology: ordered. ? ?Risk ?Prescription drug management. ?Decision regarding hospitalization. ? ? 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  She is tachycardic but well perfused on evaluation with normal blood pressure.  No respiratory distress.  CBC with stable anemia and thrombocytopenia.  No prior history of GI bleed.  Given her anticoagulation and high risk status she was treated with Protonix.  Recommend observation given her high bleeding risk and reports of hematemesis at home.  Medicine consulted for admission.  Patient and family updated findings of studies and they are in agreement with admission. ? ? ? ? ? ? ?Final Clinical Impression(s) / ED Diagnoses ?Final diagnoses:  ?None  ? ? ?Rx / DC Orders ?ED Discharge Orders   ? ? None  ? ?  ? ? ?   ?Quintella Reichert, MD ?06/11/21 [phone] ? ?

## 2021-06-11 NOTE — Progress Notes (Signed)
Daughter waving hands in hallway to come to room. Upon entry pt looks very drowsy but is awake. Per daughter pt was trying to go to sleep and starting drooling, eyes opened but was gazed and not responding. Hands were also shaking. Pt is alert and oriented x4 now, states she feels very hot and the left side of her face (jaw and eye) are hurting. Vital signs obtained as well as blood sugar. Dr. Marylyn Ishihara called and made aware. Orders to follow.  ?

## 2021-06-11 NOTE — Progress Notes (Signed)
Pt just arrived from [place] [facility] , pt sleeping , awakes and responds to voice. Mews is yellow. Provider Alcario Drought notified. Call bell within reach, awaiting response will continue to monitor.  ? 06/11/21 2237  ?Assess: MEWS Score  ?Temp 98.6 ?F (37 ?C)  ?BP 136/90  ?Pulse Rate (!) 120  ?Resp (!) 22  ?SpO2 99 %  ?O2 Device Nasal Cannula  ?O2 Flow Rate (L/min) 2 L/min  ?Assess: MEWS Score  ?MEWS Temp 0  ?MEWS Systolic 0  ?MEWS Pulse 2  ?MEWS RR 1  ?MEWS LOC 0  ?MEWS Score 3  ?MEWS Score Color Yellow  ?Assess: if the MEWS score is Yellow or Red  ?Were vital signs taken at a resting state? Yes  ?Focused Assessment No change from prior assessment  ?Does the patient meet 2 or more of the SIRS criteria? Yes  ?Early Detection of Sepsis Score *See Row Information* Medium  ?MEWS guidelines implemented *See Row Information* Yes  ?Treat  ?MEWS Interventions Escalated (See documentation below)  ?Pain Scale 0-10  ?Pain Score 0  ?Take Vital Signs  ?Increase Vital Sign Frequency  Yellow: Q 2hr X 2 then Q 4hr X 2, if remains yellow, continue Q 4hrs  ?Escalate  ?MEWS: Escalate Yellow: discuss with charge nurse/RN and consider discussing with provider and RRT  ?Notify: Charge Nurse/RN  ?Name of Charge Nurse/RN Notified Hope RN  ?Date Charge Nurse/RN Notified 06/11/21  ?Time Charge Nurse/RN Notified 2210  ?Notify: Provider  ?Provider Name/Title Hoyt Koch  ?Date Provider Notified 06/11/21  ?Time Provider Notified 2314  ?Notification Type Page  ?Notification Reason Other (Comment) ?(Yellow Mews)  ?Provider response Evaluate remotely  ?Assess: SIRS CRITERIA  ?SIRS Temperature  0  ?SIRS Pulse 1  ?SIRS Respirations  1  ?SIRS WBC 0  ?SIRS Score Sum  2  ? ? ?

## 2021-06-11 NOTE — ED Triage Notes (Addendum)
Cough x 2 days. Has been coughing up pink tinged sputum. This morning just prior to arrival had a coughing spell and had a syncopal episode. Hx of endometrial cancer and has a mass on the lungs.  ? ?Takes eliquis for blood clot in groin and right knee but has been taking 1/2 dose for bleeding risk.  ?

## 2021-06-11 NOTE — Progress Notes (Signed)
At appx 1146 daughter yelled this Rns name to come into room. Upon arrival, patient appears to be having seizure activity.  ?Rapid RN Pamala Hurry called to assess patient. ?Warden/ranger assessed patient. ?Patient drowsy, oriented x4. Patient urinated on herself. ?Bhagat, Srihti Carlean Jews, MD paged, orders placed for Ativan PRN ?Patient VSS stable, daughter at bedside. ?Dr. Marylyn Ishihara paged.  ? ?Einar Crow, BSN ? ? ? ?

## 2021-06-11 NOTE — Consult Note (Signed)
Reason for Consult: Questionable hematemesis ?Referring Physician: Hospital team ? ?Kristen Freeman is an 71 y.o. female.  ?HPI: Patient seen and examined in hospital computer chart including care everywhere was reviewed and case discussed with her daughter as well and we reviewed the pictures on her phone of her hemoptysis versus what she threw up last night which she did eat some sweet potato and it looks more orange than red and it was not a large quantity and she has not been having any GI issues and has not had any black stools and this is the first time she threw up and no one else got sick and was after she had lie down for a while and she says she had an endoscopy and colonoscopy 2 years ago which showed a little reflux only and was not given any medicine but her oncologist recently started her on Pepcid for some indigestion which has helped and she has no other complaints ? ?Past Medical History:  ?Diagnosis Date  ? Endometrial cancer ([place])   ? dx 2019 tx'd; recurrence 12/22 - XRT x 30, Chem 03/21/21 to 04/25/21  ? Glaucoma   ? HLD (hyperlipidemia)   ? on statin  ? HTN (hypertension)   ? ? ?Past Surgical History:  ?Procedure Laterality Date  ? TM repair N/A   ? TONSILLECTOMY    ? TOTAL ABDOMINAL HYSTERECTOMY W/ BILATERAL SALPINGOOPHORECTOMY    ? TOTAL KNEE ARTHROPLASTY Left   ? ? ?Family History  ?Problem Relation Age of Onset  ? Alzheimer's disease Mother   ? Diabetes Father   ? Prostate cancer Father   ? Hypertension Sister   ? Diabetes Sister   ? Breast cancer Sister   ? Hypertension Sister   ? Diabetes Sister   ? CVA Brother   ? Alcoholism Brother   ? ? ?Social History:  reports that she has never smoked. She has never used smokeless tobacco. She reports that she does not currently use alcohol. She reports that she does not use drugs. ? ?Allergies:  ?Allergies  ?Allergen Reactions  ? Latex Rash and Other (See Comments)  ?  Band-Aids are not tolerated  ? Tape Rash  ? ? ?Medications: I have reviewed the  patient's current medications. ? ?Results for orders placed or performed during the hospital encounter of 06/11/21 (from the past 48 hour(s))  ?CBC with Differential     Status: Abnormal  ? Collection Time: 06/11/21  4:14 AM  ?Result Value Ref Range  ? WBC 5.2 4.0 - 10.5 K/uL  ? RBC 2.46 (L) 3.87 - 5.11 MIL/uL  ? Hemoglobin 8.1 (L) 12.0 - 15.0 g/dL  ? HCT 24.9 (L) 36.0 - 46.0 %  ? MCV 101.2 (H) 80.0 - 100.0 fL  ? MCH 32.9 26.0 - 34.0 pg  ? MCHC 32.5 30.0 - 36.0 g/dL  ? RDW 18.6 (H) 11.5 - 15.5 %  ? Platelets 93 (L) 150 - 400 K/uL  ?  Comment: Immature Platelet Fraction may be ?clinically indicated, consider ?ordering this additional test ?[id number] ?  ? nRBC 0.4 (H) 0.0 - 0.2 %  ? Neutrophils Relative % 84 %  ? Neutro Abs 4.3 1.7 - 7.7 K/uL  ? Lymphocytes Relative 3 %  ? Lymphs Abs 0.2 (L) 0.7 - 4.0 K/uL  ? Monocytes Relative 12 %  ? Monocytes Absolute 0.6 0.1 - 1.0 K/uL  ? Eosinophils Relative 0 %  ? Eosinophils Absolute 0.0 0.0 - 0.5 K/uL  ? Basophils Relative 0 %  ?  Basophils Absolute 0.0 0.0 - 0.1 K/uL  ? Immature Granulocytes 1 %  ? Abs Immature Granulocytes 0.06 0.00 - 0.07 K/uL  ?  Comment: Performed at [facility], [place] [address], [place], [place] [address]  ?Comprehensive metabolic panel     Status: Abnormal  ? Collection Time: 06/11/21  4:14 AM  ?Result Value Ref Range  ? Sodium 135 135 - 145 mmol/L  ? Potassium 3.7 3.5 - 5.1 mmol/L  ? Chloride 103 98 - 111 mmol/L  ? CO2 24 22 - 32 mmol/L  ? Glucose, Bld 146 (H) 70 - 99 mg/dL  ?  Comment: Glucose reference range applies only to samples taken after fasting for at least 8 hours.  ? BUN 10 8 - 23 mg/dL  ? Creatinine, Ser 0.55 0.44 - 1.00 mg/dL  ? Calcium 9.2 8.9 - 10.3 mg/dL  ? Total Protein 7.2 6.5 - 8.1 g/dL  ? Albumin 3.4 (L) 3.5 - 5.0 g/dL  ? AST 34 15 - 41 U/L  ? ALT 21 0 - 44 U/L  ? Alkaline Phosphatase 104 38 - 126 U/L  ? Total Bilirubin 1.1 0.3 - 1.2 mg/dL  ? GFR, Estimated >60 >60 mL/min  ?  Comment: (NOTE) ?Calculated using the  CKD-EPI Creatinine Equation (2021) ?  ? Anion gap 8 5 - 15  ?  Comment: Performed at [facility], [place] [address], [place], [place] [address]  ?Troponin I (High Sensitivity)     Status: None  ? Collection Time: 06/11/21  4:14 AM  ?Result Value Ref Range  ? Troponin I (High Sensitivity) 13 <18 ng/L  ?  Comment: (NOTE) ?Elevated high sensitivity troponin I (hsTnI) values and significant  ?changes across serial measurements may suggest ACS but many other  ?chronic and acute conditions are known to elevate hsTnI results.  ?Refer to the "Links" section for chest pain algorithms and additional  ?guidance. ?Performed at [facility], [place] ?[place], [place] [address] ?  ?Troponin I (High Sensitivity)     Status: None  ? Collection Time: 06/11/21  6:48 AM  ?Result Value Ref Range  ? Troponin I (High Sensitivity) 12 <18 ng/L  ?  Comment: (NOTE) ?Elevated high sensitivity troponin I (hsTnI) values and significant  ?changes across serial measurements may suggest ACS but many other  ?chronic and acute conditions are known to elevate hsTnI results.  ?Refer to the "Links" section for chest pain algorithms and additional  ?guidance. ?Performed at [facility], [place] ?[place], [place] [address] ?  ?Type and screen [place]     Status: None  ? Collection Time: 06/11/21  8:00 AM  ?Result Value Ref Range  ? ABO/RH(D) O POS   ? Antibody Screen NEG   ? Sample Expiration    ?  06/14/2021,2359 ?Performed at [facility], [place] [address] [place], [place] [address] ?  ? ? ?DG Chest 2 View ? ?Result Date: 06/11/2021 ?CLINICAL DATA:  Productive cough. EXAM: CHEST - 2 VIEW COMPARISON:  May 23, 2021 FINDINGS: There is stable left-sided venous Port-A-Cath positioning. Multiple bilateral lung masses of various sizes are noted. There is no evidence of acute infiltrate, pleural effusion or pneumothorax. The heart size and  mediastinal contours are within normal limits. Multilevel degenerative changes seen throughout the thoracic spine. IMPRESSION: Multiple bilateral lung masses consistent with pulmonary metastasis. Electronically Signed   By: Virgina Norfolk M.D.   On: 06/11/2021 03:52   ? ?  ROS negative except her bloody sputum ?Blood pressure 130/90, pulse (!) 114, temperature 98.7 ?F (37.1 ?C), temperature source Oral, resp. rate (!) 21, height '4\' 11"'$  (1.499 m), weight 54 kg, SpO2 99 %. ?Physical Exam vital signs stable afebrile no acute distress in good spirits abdomen is soft nontender hemoglobin actually fairly stable over a few days BUN ?Normal ? ?Assessment/Plan: ?Doubt hematemesis and without signs of melena and no hemoglobin drop doubt significant bleeding based on the pictures of her vomit as well ?Plan: We will allow her to eat since she wants to Will ask the rounding team to reevaluate tomorrow just to make sure an EGD is not needed and please call us sooner if signs of bleeding or other GI question or problem ? ?[facility] E ?06/11/2021, 11:08 AM  ? ? ? ? ?

## 2021-06-11 NOTE — Progress Notes (Signed)
Pt planning to transfer to [facility] [id number] via [facility] after CTPA.   ? ?Pt started c/o being hot.  VS as below.  Daughter reports that this is a similar presentation to previous episodes.  No movements of extremeties noted, just somnolence.  Did not last longer than 5 minutes.  NIHSS 1 (some dysarthria noted).  RRT notified and in room for assessment and assistance in transferring to [place]. ? ?Upon return to room, pt is drowsy, but arousable.  Family in room and updated on status and transfer. ? ?Report called to Varney Daily RN at [facility] [id number] [phone]).  [facility] notified at 2050 for transport. ? ? ? 06/11/21 1957 06/11/21 2000 06/11/21 2001  ?Vitals  ?Temp 98.6 ?F (37 ?C)  --   --   ?Temp Source Oral  --   --   ?BP (!) 76/49  --  (!) 89/50  ?MAP (mmHg) (!) 58  --   --   ?BP Location Left Arm  --   --   ?BP Method Automatic  --   --   ?Patient Position (if appropriate) Lying  --   --   ?Pulse Rate (!) 117  --  (!) 117  ?Pulse Rate Source Monitor  --   --   ?ECG Heart Rate  --  (!) 118  --   ?Resp 18 19  --   ?MEWS COLOR  ?MEWS Score Color Red Red Yellow  ?Oxygen Therapy  ?SpO2 96 %  --   --   ?O2 Device Room Air  --   --   ?Pain Assessment  ?Pain Scale 0-10  --   --   ?Pain Score 0  --   --   ?POSS Scale ([place] Opioid Sedation Scale)  ?POSS *See Group Information* 1-Acceptable,Awake and alert  --   --   ? ? 06/11/21 2005 06/11/21 2012  ?Vitals  ?Temp  --   --   ?Temp Source  --   --   ?BP 99/65 133/78  ?MAP (mmHg)  --   --   ?BP Location  --   --   ?BP Method  --   --   ?Patient Position (if appropriate)  --   --   ?Pulse Rate (!) 115 (!) 112  ?Pulse Rate Source  --   --   ?ECG Heart Rate  --  (!) 113  ?Resp  --  (!) 24  ?MEWS COLOR  ?MEWS Score Color Yellow Yellow  ?Oxygen Therapy  ?SpO2  --  91 %  ?O2 Device  --  Room Air  ?Pain Assessment  ?Pain Scale  --   --   ?Pain Score  --   --   ?POSS Scale ([place] Opioid Sedation Scale)  ?POSS *See Group Information*  --   --   ? ?Ayesha Mohair BSN RN CMSRN ?06/11/2021,  8:55 PM ? ?

## 2021-06-12 ENCOUNTER — Observation Stay (HOSPITAL_COMMUNITY): Payer: Medicare Other

## 2021-06-12 ENCOUNTER — Inpatient Hospital Stay: Payer: Medicare Other

## 2021-06-12 ENCOUNTER — Telehealth: Payer: Self-pay | Admitting: Oncology

## 2021-06-12 ENCOUNTER — Inpatient Hospital Stay: Payer: Medicare Other | Admitting: Hematology and Oncology

## 2021-06-12 DIAGNOSIS — N179 Acute kidney failure, unspecified: Secondary | ICD-10-CM | POA: Diagnosis present

## 2021-06-12 DIAGNOSIS — D61818 Other pancytopenia: Secondary | ICD-10-CM | POA: Diagnosis not present

## 2021-06-12 DIAGNOSIS — I82220 Acute embolism and thrombosis of inferior vena cava: Secondary | ICD-10-CM | POA: Diagnosis present

## 2021-06-12 DIAGNOSIS — D6869 Other thrombophilia: Secondary | ICD-10-CM | POA: Diagnosis present

## 2021-06-12 DIAGNOSIS — I82431 Acute embolism and thrombosis of right popliteal vein: Secondary | ICD-10-CM | POA: Diagnosis present

## 2021-06-12 DIAGNOSIS — R29701 NIHSS score 1: Secondary | ICD-10-CM | POA: Diagnosis present

## 2021-06-12 DIAGNOSIS — H409 Unspecified glaucoma: Secondary | ICD-10-CM | POA: Diagnosis present

## 2021-06-12 DIAGNOSIS — Z7901 Long term (current) use of anticoagulants: Secondary | ICD-10-CM

## 2021-06-12 DIAGNOSIS — I6330 Cerebral infarction due to thrombosis of unspecified cerebral artery: Secondary | ICD-10-CM | POA: Insufficient documentation

## 2021-06-12 DIAGNOSIS — R042 Hemoptysis: Secondary | ICD-10-CM | POA: Diagnosis not present

## 2021-06-12 DIAGNOSIS — C7800 Secondary malignant neoplasm of unspecified lung: Secondary | ICD-10-CM | POA: Diagnosis present

## 2021-06-12 DIAGNOSIS — R55 Syncope and collapse: Secondary | ICD-10-CM

## 2021-06-12 DIAGNOSIS — E785 Hyperlipidemia, unspecified: Secondary | ICD-10-CM | POA: Diagnosis present

## 2021-06-12 DIAGNOSIS — Z66 Do not resuscitate: Secondary | ICD-10-CM | POA: Diagnosis present

## 2021-06-12 DIAGNOSIS — E7800 Pure hypercholesterolemia, unspecified: Secondary | ICD-10-CM

## 2021-06-12 DIAGNOSIS — Z515 Encounter for palliative care: Secondary | ICD-10-CM | POA: Diagnosis not present

## 2021-06-12 DIAGNOSIS — Z79899 Other long term (current) drug therapy: Secondary | ICD-10-CM | POA: Diagnosis not present

## 2021-06-12 DIAGNOSIS — E876 Hypokalemia: Secondary | ICD-10-CM | POA: Diagnosis not present

## 2021-06-12 DIAGNOSIS — R739 Hyperglycemia, unspecified: Secondary | ICD-10-CM | POA: Diagnosis present

## 2021-06-12 DIAGNOSIS — R29818 Other symptoms and signs involving the nervous system: Secondary | ICD-10-CM | POA: Diagnosis not present

## 2021-06-12 DIAGNOSIS — K920 Hematemesis: Secondary | ICD-10-CM | POA: Diagnosis present

## 2021-06-12 DIAGNOSIS — C541 Malignant neoplasm of endometrium: Secondary | ICD-10-CM | POA: Diagnosis present

## 2021-06-12 DIAGNOSIS — I639 Cerebral infarction, unspecified: Secondary | ICD-10-CM

## 2021-06-12 DIAGNOSIS — Z820 Family history of epilepsy and other diseases of the nervous system: Secondary | ICD-10-CM | POA: Diagnosis not present

## 2021-06-12 DIAGNOSIS — Z7189 Other specified counseling: Secondary | ICD-10-CM | POA: Diagnosis not present

## 2021-06-12 DIAGNOSIS — C7801 Secondary malignant neoplasm of right lung: Secondary | ICD-10-CM | POA: Diagnosis not present

## 2021-06-12 DIAGNOSIS — Z833 Family history of diabetes mellitus: Secondary | ICD-10-CM | POA: Diagnosis not present

## 2021-06-12 DIAGNOSIS — Z8042 Family history of malignant neoplasm of prostate: Secondary | ICD-10-CM | POA: Diagnosis not present

## 2021-06-12 DIAGNOSIS — R7303 Prediabetes: Secondary | ICD-10-CM | POA: Diagnosis present

## 2021-06-12 DIAGNOSIS — I10 Essential (primary) hypertension: Secondary | ICD-10-CM

## 2021-06-12 DIAGNOSIS — D61810 Antineoplastic chemotherapy induced pancytopenia: Secondary | ICD-10-CM | POA: Diagnosis present

## 2021-06-12 LAB — LIPID PANEL
Cholesterol: 152 mg/dL (ref 0–200)
HDL: 34 mg/dL — ABNORMAL LOW (ref >40)
LDL Cholesterol: 102 mg/dL — ABNORMAL HIGH (ref 0–99)
Total CHOL/HDL Ratio: 4.5 RATIO
Triglycerides: 78 mg/dL (ref <150)
VLDL: 16 mg/dL (ref 0–40)

## 2021-06-12 LAB — CBC
HCT: 21.1 % — ABNORMAL LOW (ref 36.0–46.0)
Hemoglobin: 7 g/dL — ABNORMAL LOW (ref 12.0–15.0)
MCH: 33.8 pg (ref 26.0–34.0)
MCHC: 33.2 g/dL (ref 30.0–36.0)
MCV: 101.9 fL — ABNORMAL HIGH (ref 80.0–100.0)
Platelets: 71 10*3/uL — ABNORMAL LOW (ref 150–400)
RBC: 2.07 MIL/uL — ABNORMAL LOW (ref 3.87–5.11)
RDW: 19.5 % — ABNORMAL HIGH (ref 11.5–15.5)
WBC: 3.5 10*3/uL — ABNORMAL LOW (ref 4.0–10.5)
nRBC: 0 % (ref 0.0–0.2)

## 2021-06-12 LAB — HEMOGLOBIN AND HEMATOCRIT, BLOOD
HCT: 25.4 % — ABNORMAL LOW (ref 36.0–46.0)
Hemoglobin: 8.2 g/dL — ABNORMAL LOW (ref 12.0–15.0)

## 2021-06-12 LAB — COMPREHENSIVE METABOLIC PANEL
ALT: 16 U/L (ref 0–44)
AST: 26 U/L (ref 15–41)
Albumin: 2.6 g/dL — ABNORMAL LOW (ref 3.5–5.0)
Alkaline Phosphatase: 70 U/L (ref 38–126)
Anion gap: 7 (ref 5–15)
BUN: 8 mg/dL (ref 8–23)
CO2: 23 mmol/L (ref 22–32)
Calcium: 8.8 mg/dL — ABNORMAL LOW (ref 8.9–10.3)
Chloride: 106 mmol/L (ref 98–111)
Creatinine, Ser: 0.56 mg/dL (ref 0.44–1.00)
GFR, Estimated: >60 mL/min (ref >60)
Glucose, Bld: 123 mg/dL — ABNORMAL HIGH (ref 70–99)
Potassium: 3.5 mmol/L (ref 3.5–5.1)
Sodium: 136 mmol/L (ref 135–145)
Total Bilirubin: 0.9 mg/dL (ref 0.3–1.2)
Total Protein: 5.7 g/dL — ABNORMAL LOW (ref 6.5–8.1)

## 2021-06-12 LAB — HEMOGLOBIN A1C
Hgb A1c MFr Bld: 5.3 % (ref 4.8–5.6)
Mean Plasma Glucose: 105.41 mg/dL

## 2021-06-12 LAB — PREPARE RBC (CROSSMATCH)

## 2021-06-12 LAB — ECHOCARDIOGRAM COMPLETE
Height: 59 in
S' Lateral: 2.3 cm
Weight: 1961.21 oz

## 2021-06-12 MED ORDER — ATORVASTATIN CALCIUM 10 MG PO TABS
20.0000 mg | ORAL_TABLET | Freq: Every day | ORAL | Status: DC
  Administered 2021-06-12 – 2021-06-14 (×3): 20 mg via ORAL
  Filled 2021-06-12 (×3): qty 2

## 2021-06-12 MED ORDER — SODIUM CHLORIDE 0.9% IV SOLUTION: Freq: Once | INTRAVENOUS | Status: AC

## 2021-06-12 MED ORDER — DIPHENHYDRAMINE HCL 25 MG PO CAPS
25.0000 mg | ORAL_CAPSULE | Freq: Once | ORAL | Status: AC
  Administered 2021-06-12: 25 mg via ORAL
  Filled 2021-06-12: qty 1

## 2021-06-12 MED ORDER — SODIUM CHLORIDE 0.9% FLUSH: 10.0000 mL | INTRAVENOUS | Status: DC | PRN

## 2021-06-12 MED ORDER — SODIUM CHLORIDE 0.9% FLUSH
10.0000 mL | Freq: Two times a day (BID) | INTRAVENOUS | Status: DC
  Administered 2021-06-12 – 2021-06-13 (×2): 10 mL

## 2021-06-12 MED ORDER — APIXABAN 2.5 MG PO TABS
2.5000 mg | ORAL_TABLET | Freq: Two times a day (BID) | ORAL | Status: DC
  Administered 2021-06-12 – 2021-06-14 (×4): 2.5 mg via ORAL
  Filled 2021-06-12 (×5): qty 1

## 2021-06-12 MED ORDER — ACETAMINOPHEN 325 MG PO TABS
650.0000 mg | ORAL_TABLET | Freq: Once | ORAL | Status: AC
  Administered 2021-06-12: 650 mg via ORAL
  Filled 2021-06-12: qty 2

## 2021-06-12 MED ORDER — SENNA 8.6 MG PO TABS
1.0000 | ORAL_TABLET | Freq: Every day | ORAL | Status: DC
  Administered 2021-06-12 – 2021-06-14 (×3): 8.6 mg via ORAL
  Filled 2021-06-12 (×3): qty 1

## 2021-06-12 MED ORDER — CHLORHEXIDINE GLUCONATE CLOTH 2 % EX PADS
6.0000 | MEDICATED_PAD | Freq: Every day | CUTANEOUS | Status: DC
  Administered 2021-06-12 – 2021-06-13 (×2): 6 via TOPICAL

## 2021-06-12 MED ORDER — POLYETHYLENE GLYCOL 3350 17 G PO PACK
17.0000 g | PACK | Freq: Every day | ORAL | Status: DC
  Administered 2021-06-12 – 2021-06-14 (×3): 17 g via ORAL
  Filled 2021-06-12 (×3): qty 1

## 2021-06-12 NOTE — Progress Notes (Signed)
Kristen Freeman   DOB:February 14, 1951   NG#:[id number]   ? ?ASSESSMENT & PLAN:  ?Metastatic uterine cancer ?The patient is admitted due to syncopal episode, MRI of the brain without contrast show evidence of acute infarct ?Overall, her prognosis is very poor ?She has ongoing hemoptysis despite completing a course of palliative radiation to her lungs as well as recent chemotherapy ? ?From my prior hospitalization, I was optimistic that with aggressive supportive care, we can get her strong enough to receive further chemotherapy ?She received a dose of single agent carboplatin recently while hospitalized ?Continue supportive care right now but I am not optimistic she can pursue further chemotherapy after this ? ?Acute infarct ?The patient have ongoing hemoptysis ?She was recently started on low-dose Eliquis in the outpatient clinic for IVC clot that was seen on CT imaging ?Continue medical management ?I have spoken with hospitalist to discontinue aspirin and continue Eliquis and stent ? 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 ?Acquired pancytopenia ?Multifactorial, related to recent treatment and ongoing bleeding with consumption ?I will proceed to order a unit of blood.  I suspect the cause of her ischemia is induced by pancytopenia ?I plan to keep hemoglobin above 8 due to recent infarct/ischemia ?She does not need platelet transfusion unless it drops less than 50,000 or bleeding ? ?Code Status ?From our prior discussion in prior hospitalization, she remain on full code ? ?Goals of care ?She will continue assessment and management by hospitalist and neurologist to address her recent infarct ?Her overall prognosis is poor ?I will try to see her within the next 2 days to address overall goals of care due to her poor prognosis ?Unfortunately, I cannot return later today or tomorrow due to my [facility] outpatient clinic schedule but I could potentially  see her if she is still hospitalized on Wednesday ? ?Discharge planning ?I anticipate discharge within the next 2 to 3 days ? ?All questions were answered. The patient knows to call the clinic with any problems, questions or concerns. ?  ?The total time spent in the appointment was 40 minutes encounter with patients including review of chart and various tests results, discussions about plan of care and coordination of care plan ? ?Heath Lark, MD ?06/12/2021 8:36 AM ? ?Subjective:  ?I was notified of her admission.  I saw her at 7:15 AM ?The patient was admitted to the hospital after presentation with stroke ?She was sleeping.  I did not wake her up ? ?Objective:  ?Vitals:  ? 06/12/21 0400 06/12/21 0746  ?BP: 113/75 114/75  ?Pulse: 80 (!) 109  ?Resp: 20 20  ?Temp: 98.6 ?F (37 ?C) 98.1 ?F (36.7 ?C)  ?SpO2: 98% 99%  ?  ? ?Intake/Output Summary (Last 24 hours) at 06/12/2021 0836 ?Last data filed at 06/12/2021 0617 ?Gross per 24 hour  ?Intake 545.18 ml  ?Output 800 ml  ?Net -254.82 ml  ? ? ?GENERAL: The patient is sleeping.  I did not wake her up ?  ?Labs:  ?Recent Labs  ?  05/21/21 ?1140 05/22/21 ?0441 06/05/21 ?0830 06/11/21 ?0414 06/12/21 ?0315  ?NA  --    < > 133* 135 136  ?K  --    < > 3.8 3.7 3.5  ?CL  --    < > 103 103 106  ?CO2  --    < > '24 24 23  '$ ?GLUCOSE  --    < > 159* 146*  123*  ?BUN  --    < > '11 10 8  '$ ?CREATININE  --    < > 0.55 0.55 0.56  ?CALCIUM  --    < > 8.6* 9.2 8.8*  ?GFRNONAA  --    < > >60 >60 >60  ?PROT 6.7   < > 6.5 7.2 5.7*  ?ALBUMIN 3.3*   < > 3.2* 3.4* 2.6*  ?AST 27   < > 27 34 26  ?ALT 23   < > '16 21 16  '$ ?ALKPHOS 67   < > 90 104 70  ?BILITOT 0.8   < > 1.2 1.1 0.9  ?BILIDIR 0.2  --   --   --   --   ?IBILI 0.6  --   --   --   --   ? < > = values in this interval not displayed.  ? ? ?Studies: I have reviewed her imaging studies ?CT ANGIO HEAD NECK W WO CM ? ?Result Date: 06/11/2021 ?CLINICAL DATA:  Follow-up examination for acute stroke. EXAM: CT ANGIOGRAPHY HEAD AND NECK TECHNIQUE: Multidetector  CT imaging of the head and neck was performed using the standard protocol during bolus administration of intravenous contrast. Multiplanar CT image reconstructions and MIPs were obtained to evaluate the vascular anatomy. Carotid stenosis measurements (when applicable) are obtained utilizing NASCET criteria, using the distal internal carotid diameter as the denominator. RADIATION DOSE REDUCTION: This exam was performed according to the departmental dose-optimization program which includes automated exposure control, adjustment of the mA and/or kV according to patient size and/or use of iterative reconstruction technique. CONTRAST:  127m OMNIPAQUE IOHEXOL 350 MG/ML SOLN COMPARISON:  Prior MRI from earlier the same day. FINDINGS: CT HEAD FINDINGS Brain: Age-related cerebral atrophy with chronic small vessel ischemic disease. Previously identified scattered small volume infarcts not visible by CT. No other acute large vessel territory infarct. No intracranial hemorrhage. No mass lesion, midline shift or mass effect. No hydrocephalus or extra-axial fluid collection. Vascular: No hyperdense vessel. Scattered vascular calcifications noted within the carotid siphons. Skull: Scalp soft tissues and calvarium within normal limits. Sinuses: Paranasal sinuses and mastoid air cells are largely clear. Orbits: Globes orbital soft tissues demonstrate no acute finding. Review of the MIP images confirms the above findings CTA NECK FINDINGS Aortic arch: Visualized aortic arch normal in caliber with normal branch pattern. Mild atheromatous plaque along the undersurface of the arch itself. Arch and origin of the great vessels partially encased by extensive mediastinal adenopathy. No significant stenosis. Right carotid system: Right CCA patent from its origin to the bifurcation without stenosis. Mild eccentric soft plaque at the right bifurcation without hemodynamically significant stenosis. Right ICA patent distally without stenosis or  dissection. Left carotid system: Left common and internal carotid arteries patent without stenosis or dissection. Minimal plaque at the left bifurcation without stenosis. Vertebral arteries: Both vertebral arteries arise from subclavian arteries. No proximal subclavian artery stenosis. Left vertebral artery dominant. Vertebral arteries patent without stenosis or dissection. Skeleton: No discrete or worrisome osseous lesions. Moderate spondylosis present at C5-6 through C7-T1. Degenerative changes noted about the left TMJ. Other neck: No other acute soft tissue abnormality within the neck. Upper chest: Extensive bulky heterogeneous and hypodense adenopathy seen within the upper mediastinum, partially encasing the great vessels. Adenopathy extends to involve the left supraclavicular region and upper left chest wall. Multiple scattered pulmonary nodules/masses seen throughout the visualized lungs, largest of which measures 3.7 cm at the left lung apex. Overall, these findings are progressed and worsened  from prior. Left-sided central venous catheter in place. Review of the MIP images confirms the above findings CTA HEAD FINDINGS Anterior circulation: Petrous segments patent bilaterally. Atheromatous change seen throughout the carotid siphons with associated mild to moderate narrowing. A1 segments patent bilaterally. Normal anterior communicating artery complex. Anterior cerebral arteries widely patent. No M1 stenosis or occlusion. No proximal MCA branch occlusion. Distal MCA branches perfused and symmetric. Posterior circulation: Both vertebral arteries patent without stenosis. Left vertebral artery dominant. Left PICA patent. Right PICA not well seen. Basilar patent to its distal aspect without stenosis. Superior cerebellar and posterior cerebral arteries widely patent bilaterally. Venous sinuses: Patent allowing for timing the contrast bolus. Anatomic variants: As above.  No aneurysm. Review of the MIP images  confirms the above findings IMPRESSION: CT HEAD IMPRESSION: 1. No acute intracranial abnormality. Previously identified small ischemic infarcts not visible by CT. No intracranial hemorrhage. 2. Underlying atrop

## 2021-06-12 NOTE — Consult Note (Signed)
WOC Nurse Consult Note: ?Reason for Consult:Stage 2 pressure injury to sacrum ?Wound type:pressure plus moisture ?Pressure Injury POA: Yes ?Measurement:2cm x 1.5cm x 0.1cm ?Wound bed:red, moist ?Drainage (amount, consistency, odor) small serous ?Periwound:intact, moist ?Dressing procedure/placement/frequency: Patient has urinary incontinence that is being managed by an external urinary incontinence collection device (PurWick). I will add topical care guidance for the care of this lesion using an antimicrobial nonadherent (xeroform) gauze as a wound contact layer and top it with a silicone foam for the sacrum. Turning and repositioning is in place. Heels will be floated. ? ?[place] nursing team will not follow, but will remain available to this patient, the nursing and medical teams.  Please re-consult if needed. ?Thanks, ?Maudie Flakes, MSN, RN, [place], [place], CWON-AP, [place]  ?Pager# [phone]  ? ? ? ?  ?

## 2021-06-12 NOTE — Progress Notes (Signed)
OT Cancellation Note ? ?Patient Details ?Name: Kristen Freeman ?MRN: [id number] ?DOB: 04/20/1950 ? ? ?Cancelled Treatment:    Reason Eval/Treat Not Completed: Patient at procedure or test/ unavailable  Pt undergoing ECHO and also having 24 hr EEG monitoring.  Will check back tomorrow to proceed with eval. ? ?Reyansh Kushnir OTR/L ?06/12/2021, 3:35 PM ?

## 2021-06-12 NOTE — Progress Notes (Signed)
SLP Cancellation Note ? ?Patient Details ?Name: Kristen Freeman ?MRN: [id number] ?DOB: [birth year] ? ? ?Cancelled treatment:       Reason Eval/Treat Not Completed: Patient at procedure or test/unavailable when SLP tried to see earlier. Will f/u for cognitive-linguistic eval as able.  ? ? ? ?Osie Bond., M.A. CCC-SLP ?Acute Rehabilitation Services ?Office [phone] ? ?Secure chat preferred ? ?06/12/2021, 4:51 PM ?

## 2021-06-12 NOTE — Procedures (Signed)
Patient Name: Kristen Freeman  ?MRN: [id number]  ?Epilepsy Attending: Lora Havens  ?Referring Physician/Provider: Jonnie Finner, DO ?Date: 06/12/2021 ?Duration: 22.27 mins ? ?Patient history: 71 year old female with episodes of left face due to sensation followed by loss of consciousness.  EEG to evaluate for seizure. ? ?Level of alertness: Awake ? ?AEDs during EEG study: None ? ?Technical aspects: This EEG study was done with scalp electrodes positioned according to the 10-20 International system of electrode placement. Electrical activity was acquired at a sampling rate of '500Hz'$  and reviewed with a high frequency filter of '70Hz'$  and a low frequency filter of '1Hz'$ . EEG data were recorded continuously and digitally stored.  ? ?Description: The posterior dominant rhythm consists of 8-9 Hz activity of moderate voltage (25-35 uV) seen predominantly in posterior head regions, symmetric and reactive to eye opening and eye closing. Hyperventilation and photic stimulation were not performed.    ? ?IMPRESSION: ?This study is within normal limits. No seizures or epileptiform discharges were seen throughout the recording. ? ?Lora Havens  ? ?

## 2021-06-12 NOTE — Plan of Care (Signed)

## 2021-06-12 NOTE — Telephone Encounter (Signed)
06/08/2021 paperwork completed for both long-term and short-term disability to designated pick-up bin for collaborative pick up for provider review and signature.   ? ?06/12/2021 Paperwork successfully returned via fax.  Originals returned to patient address on file.   ?[place] ?[place] [phone] ?No further form activity needed or requested. ?

## 2021-06-12 NOTE — Progress Notes (Signed)
PT Cancellation Note ? ?Patient Details ?Name: Kristen Freeman ?MRN: [id number] ?DOB: August 05, 1950 ? ? ?Cancelled Treatment:    Reason Eval/Treat Not Completed: Patient at procedure or test/unavailable ? ?Patient being set up for EEG. Will continue to follow and evaluate as appropriate.  ? ? ?Arby Barrette, PT ?Acute Rehabilitation Services  ?Pager [phone] ?Office [phone] ? ? ?Jeanie Cooks Raed Schalk ?06/12/2021, 8:50 AM ?

## 2021-06-12 NOTE — Progress Notes (Signed)
Subjective: ?Yesterday evening's events noted, patient was transferred from [place] to [facility] for seizures. ? ?Patient denies vomiting, has not had any black stools or blood in stools, denies abdominal pain. ? ?Patient complains of frequent hemoptysis. ? ?When she vomited orange/red/pinkish fluid, she is unsure if it was blood versus what she had eaten. ? ?Patient reports having an endoscopy and colonoscopy at [facility] less than 5 years ago, she reports being given Pepcid for acid reflux and was recommended to have a colonoscopy in 5 years. ?As per documentation, she had an EGD and colonoscopy on 04/08/2019: Mild reflux, mild antritis, tortuous colon, scattered diverticulosis, benign inflammatory pseudopolyp in ascending colon. ? ?Objective: ?Vital signs in last 24 hours: ?Temp:  [98.1 ?F (36.7 ?C)-99.1 ?F (37.3 ?C)] 98.1 ?F (36.7 ?C) (04/17 0746) ?Pulse Rate:  [80-120] 109 (04/17 0746) ?Resp:  [18-24] 20 (04/17 0746) ?BP: (76-172)/(49-90) 114/75 (04/17 0746) ?SpO2:  [91 %-100 %] 99 % (04/17 0746) ?Weight:  [55.6 kg] 55.6 kg (04/16 2237) ?Weight change: 1.622 kg ?  ? ?PE: Ill-appearing, prominent pallor ?GENERAL: On oxygen via nasal cannula, not using accessory muscles of respiration, able to speak in full sentences  ?ABDOMEN: Soft, nondistended, nontender, normoactive bowel sounds ?EXTREMITIES: Able to move all 4 extremities ? ?Lab Results: ?Results for orders placed or performed during the hospital encounter of 06/11/21 (from the past 48 hour(s))  ?CBC with Differential     Status: Abnormal  ? Collection Time: 06/11/21  4:14 AM  ?Result Value Ref Range  ? WBC 5.2 4.0 - 10.5 K/uL  ? RBC 2.46 (L) 3.87 - 5.11 MIL/uL  ? Hemoglobin 8.1 (L) 12.0 - 15.0 g/dL  ? HCT 24.9 (L) 36.0 - 46.0 %  ? MCV 101.2 (H) 80.0 - 100.0 fL  ? MCH 32.9 26.0 - 34.0 pg  ? MCHC 32.5 30.0 - 36.0 g/dL  ? RDW 18.6 (H) 11.5 - 15.5 %  ? Platelets 93 (L) 150 - 400 K/uL  ?  Comment: Immature Platelet Fraction may be ?clinically indicated,  consider ?ordering this additional test ?[id number] ?  ? nRBC 0.4 (H) 0.0 - 0.2 %  ? Neutrophils Relative % 84 %  ? Neutro Abs 4.3 1.7 - 7.7 K/uL  ? Lymphocytes Relative 3 %  ? Lymphs Abs 0.2 (L) 0.7 - 4.0 K/uL  ? Monocytes Relative 12 %  ? Monocytes Absolute 0.6 0.1 - 1.0 K/uL  ? Eosinophils Relative 0 %  ? Eosinophils Absolute 0.0 0.0 - 0.5 K/uL  ? Basophils Relative 0 %  ? Basophils Absolute 0.0 0.0 - 0.1 K/uL  ? Immature Granulocytes 1 %  ? Abs Immature Granulocytes 0.06 0.00 - 0.07 K/uL  ?  Comment: Performed at [facility], [place] [address] [place],  [address]  ?Comprehensive metabolic panel     Status: Abnormal  ? Collection Time: 06/11/21  4:14 AM  ?Result Value Ref Range  ? Sodium 135 135 - 145 mmol/L  ? Potassium 3.7 3.5 - 5.1 mmol/L  ? Chloride 103 98 - 111 mmol/L  ? CO2 24 22 - 32 mmol/L  ? Glucose, Bld 146 (H) 70 - 99 mg/dL  ?  Comment: Glucose reference range applies only to samples taken after fasting for at least 8 hours.  ? BUN 10 8 - 23 mg/dL  ? Creatinine, Ser 0.55 0.44 - 1.00 mg/dL  ? Calcium 9.2 8.9 - 10.3 mg/dL  ? Total Protein 7.2 6.5 - 8.1 g/dL  ? Albumin 3.4 (L)  3.5 - 5.0 g/dL  ? AST 34 15 - 41 U/L  ? ALT 21 0 - 44 U/L  ? Alkaline Phosphatase 104 38 - 126 U/L  ? Total Bilirubin 1.1 0.3 - 1.2 mg/dL  ? GFR, Estimated >60 >60 mL/min  ?  Comment: (NOTE) ?Calculated using the CKD-EPI Creatinine Equation (2021) ?  ? Anion gap 8 5 - 15  ?  Comment: Performed at [facility], [place] [address], [place], [place] [address]  ?Troponin I (High Sensitivity)     Status: None  ? Collection Time: 06/11/21  4:14 AM  ?Result Value Ref Range  ? Troponin I (High Sensitivity) 13 <18 ng/L  ?  Comment: (NOTE) ?Elevated high sensitivity troponin I (hsTnI) values and significant  ?changes across serial measurements may suggest ACS but many other  ?chronic and acute conditions are known to elevate hsTnI results.  ?Refer to the "Links" section for chest pain algorithms and  additional  ?guidance. ?Performed at [facility], [place] [address] ?[place], [place] [address] ?  ?Troponin I (High Sensitivity)     Status: None  ? Collection Time: 06/11/21  6:48 AM  ?Result Value Ref Range  ? Troponin I (High Sensitivity) 12 <18 ng/L  ?  Comment: (NOTE) ?Elevated high sensitivity troponin I (hsTnI) values and significant  ?changes across serial measurements may suggest ACS but many other  ?chronic and acute conditions are known to elevate hsTnI results.  ?Refer to the "Links" section for chest pain algorithms and additional  ?guidance. ?Performed at [facility], [place] [address] ?[place], [place] [address] ?  ?Magnesium     Status: None  ? Collection Time: 06/11/21  6:48 AM  ?Result Value Ref Range  ? Magnesium 2.1 1.7 - 2.4 mg/dL  ?  Comment: Performed at [facility], [place] [address], [place], [place] [address]  ?Type and screen [place]     Status: None  ? Collection Time: 06/11/21  8:00 AM  ?Result Value Ref Range  ? ABO/RH(D) O POS   ? Antibody Screen NEG   ? Sample Expiration    ?  06/14/2021,2359 ?Performed at [facility], [place] [address] [place], [place] [address] ?  ?Glucose, capillary     Status: Abnormal  ? Collection Time: 06/11/21 11:13 AM  ?Result Value Ref Range  ? Glucose-Capillary 121 (H) 70 - 99 mg/dL  ?  Comment: Glucose reference range applies only to samples taken after fasting for at least 8 hours.  ?Glucose, capillary     Status: Abnormal  ? Collection Time: 06/11/21  8:09 PM  ?Result Value Ref Range  ? Glucose-Capillary 144 (H) 70 - 99 mg/dL  ?  Comment: Glucose reference range applies only to samples taken after fasting for at least 8 hours.  ?Comprehensive metabolic panel     Status: Abnormal  ? Collection Time: 06/12/21  3:15 AM  ?Result Value Ref Range  ? Sodium 136 135 - 145 mmol/L  ? Potassium 3.5 3.5 - 5.1 mmol/L  ? Chloride 106 98 - 111 mmol/L  ? CO2 23 22 - 32 mmol/L   ? Glucose, Bld 123 (H) 70 - 99 mg/dL  ?  Comment: Glucose reference range applies only to samples taken after fasting for at least 8 hours.  ? BUN 8 8 - 23 mg/dL  ? Creatinine, Ser 0.56 0.44 - 1.00 mg/dL  ? Calcium 8.8 (L) 8.9 - 10.3 mg/dL  ? Total  Protein 5.7 (L) 6.5 - 8.1 g/dL  ? Albumin 2.6 (L) 3.5 - 5.0 g/dL  ? AST 26 15 - 41 U/L  ? ALT 16 0 - 44 U/L  ? Alkaline Phosphatase 70 38 - 126 U/L  ? Total Bilirubin 0.9 0.3 - 1.2 mg/dL  ? GFR, Estimated >60 >60 mL/min  ?  Comment: (NOTE) ?Calculated using the CKD-EPI Creatinine Equation (2021) ?  ? Anion gap 7 5 - 15  ?  Comment: Performed at [facility], [place] [address], , [place] [address]  ?CBC     Status: Abnormal  ? Collection Time: 06/12/21  3:15 AM  ?Result Value Ref Range  ? WBC 3.5 (L) 4.0 - 10.5 K/uL  ? RBC 2.07 (L) 3.87 - 5.11 MIL/uL  ? Hemoglobin 7.0 (L) 12.0 - 15.0 g/dL  ? HCT 21.1 (L) 36.0 - 46.0 %  ? MCV 101.9 (H) 80.0 - 100.0 fL  ? MCH 33.8 26.0 - 34.0 pg  ? MCHC 33.2 30.0 - 36.0 g/dL  ? RDW 19.5 (H) 11.5 - 15.5 %  ? Platelets 71 (L) 150 - 400 K/uL  ?  Comment: Immature Platelet Fraction may be ?clinically indicated, consider ?ordering this additional test ?[id number] ?REPEATED TO VERIFY ?PLATELET COUNT CONFIRMED BY SMEAR ?  ? nRBC 0.0 0.0 - 0.2 %  ?  Comment: Performed at [facility], [place] [address] [place], [place] [address]  ?Lipid panel     Status: Abnormal  ? Collection Time: 06/12/21  3:15 AM  ?Result Value Ref Range  ? Cholesterol 152 0 - 200 mg/dL  ? Triglycerides 78 <150 mg/dL  ? HDL 34 (L) >40 mg/dL  ? Total CHOL/HDL Ratio 4.5 RATIO  ? VLDL 16 0 - 40 mg/dL  ? LDL Cholesterol 102 (H) 0 - 99 mg/dL  ?  Comment:        ?Total Cholesterol/HDL:CHD Risk ?Coronary Heart Disease Risk Table ?                    Men   Women ? 1/2 Average Risk   3.4   3.3 ? Average Risk       5.0   4.4 ? 2 X Average Risk   9.6   7.1 ? 3 X Average Risk  23.4   11.0 ?       ?Use the calculated Patient Ratio ?above and the CHD Risk Table ?to determine the  patient's CHD Risk. ?       ?ATP III CLASSIFICATION (LDL): ? <100     mg/dL   Optimal ? 100-129  mg/dL   Near or Above ?                   Optimal ? 130-159  mg/dL   Borderline ? 160-189  mg/dL   High ? >190

## 2021-06-12 NOTE — Progress Notes (Addendum)
STROKE TEAM PROGRESS NOTE  ? ?INTERVAL HISTORY ?Her sister and daughter are at the bedside.  Patient seen in bed eating breakfast. She had an episode of tooth pain that radiated up to her eye approximately 10 minutes before examiner came into the room (1115am). Examiner had patient remove her partial dentures on the bottom and noticed what appeared to be a bruise on her gum. Patient stated that her mouth felt better without her dentures.  ?1u PRBCs ordered to transfuse, HGB 7.0. GI has signed off, no further evidence of GI bleed ? ?Vitals:  ? 06/11/21 2237 06/12/21 0000 06/12/21 0400 06/12/21 0746  ?BP: 136/90 118/75 113/75 114/75  ?Pulse: (!) 120 (!) 113 80 (!) 109  ?Resp: (!) '22 19 20 20  '$ ?Temp: 98.6 ?F (37 ?C) 98.2 ?F (36.8 ?C) 98.6 ?F (37 ?C) 98.1 ?F (36.7 ?C)  ?TempSrc: Oral Oral Oral Oral  ?SpO2: 99% 99% 98% 99%  ?Weight: 55.6 kg     ?Height:      ? ?CBC:  ?Recent Labs  ?Lab 06/11/21 ?0414 06/12/21 ?0315  ?WBC 5.2 3.5*  ?NEUTROABS 4.3  --   ?HGB 8.1* 7.0*  ?HCT 24.9* 21.1*  ?MCV 101.2* 101.9*  ?PLT 93* 71*  ? ?Basic Metabolic Panel:  ?Recent Labs  ?Lab 06/11/21 ?0414 06/11/21 ?8657 06/12/21 ?0315  ?NA 135  --  136  ?K 3.7  --  3.5  ?CL 103  --  106  ?CO2 24  --  23  ?GLUCOSE 146*  --  123*  ?BUN 10  --  8  ?CREATININE 0.55  --  0.56  ?CALCIUM 9.2  --  8.8*  ?MG  --  2.1  --   ? ?Lipid Panel:  ?Recent Labs  ?Lab 06/12/21 ?0315  ?CHOL 152  ?TRIG 78  ?HDL 34*  ?CHOLHDL 4.5  ?VLDL 16  ?[place] 102*  ? ?HgbA1c: No results for input(s): HGBA1C in the last 168 hours. ?Urine Drug Screen: No results for input(s): LABOPIA, COCAINSCRNUR, LABBENZ, AMPHETMU, THCU, LABBARB in the last 168 hours.  ?Alcohol Level No results for input(s): ETH in the last 168 hours. ? ?IMAGING past 24 hours ?CT ANGIO HEAD NECK W WO CM ? ?Result Date: 06/11/2021 ?CLINICAL DATA:  Follow-up examination for acute stroke. EXAM: CT ANGIOGRAPHY HEAD AND NECK TECHNIQUE: Multidetector CT imaging of the head and neck was performed using the standard  protocol during bolus administration of intravenous contrast. Multiplanar CT image reconstructions and MIPs were obtained to evaluate the vascular anatomy. Carotid stenosis measurements (when applicable) are obtained utilizing NASCET criteria, using the distal internal carotid diameter as the denominator. RADIATION DOSE REDUCTION: This exam was performed according to the departmental dose-optimization program which includes automated exposure control, adjustment of the mA and/or kV according to patient size and/or use of iterative reconstruction technique. CONTRAST:  114m OMNIPAQUE IOHEXOL 350 MG/ML SOLN COMPARISON:  Prior MRI from earlier the same day. FINDINGS: CT HEAD FINDINGS Brain: Age-related cerebral atrophy with chronic small vessel ischemic disease. Previously identified scattered small volume infarcts not visible by CT. No other acute large vessel territory infarct. No intracranial hemorrhage. No mass lesion, midline shift or mass effect. No hydrocephalus or extra-axial fluid collection. Vascular: No hyperdense vessel. Scattered vascular calcifications noted within the carotid siphons. Skull: Scalp soft tissues and calvarium within normal limits. Sinuses: Paranasal sinuses and mastoid air cells are largely clear. Orbits: Globes orbital soft tissues demonstrate no acute finding. Review of the MIP images confirms the above findings CTA NECK FINDINGS Aortic  arch: Visualized aortic arch normal in caliber with normal branch pattern. Mild atheromatous plaque along the undersurface of the arch itself. Arch and origin of the great vessels partially encased by extensive mediastinal adenopathy. No significant stenosis. Right carotid system: Right CCA patent from its origin to the bifurcation without stenosis. Mild eccentric soft plaque at the right bifurcation without hemodynamically significant stenosis. Right ICA patent distally without stenosis or dissection. Left carotid system: Left common and internal carotid  arteries patent without stenosis or dissection. Minimal plaque at the left bifurcation without stenosis. Vertebral arteries: Both vertebral arteries arise from subclavian arteries. No proximal subclavian artery stenosis. Left vertebral artery dominant. Vertebral arteries patent without stenosis or dissection. Skeleton: No discrete or worrisome osseous lesions. Moderate spondylosis present at C5-6 through C7-T1. Degenerative changes noted about the left TMJ. Other neck: No other acute soft tissue abnormality within the neck. Upper chest: Extensive bulky heterogeneous and hypodense adenopathy seen within the upper mediastinum, partially encasing the great vessels. Adenopathy extends to involve the left supraclavicular region and upper left chest wall. Multiple scattered pulmonary nodules/masses seen throughout the visualized lungs, largest of which measures 3.7 cm at the left lung apex. Overall, these findings are progressed and worsened from prior. Left-sided central venous catheter in place. Review of the MIP images confirms the above findings CTA HEAD FINDINGS Anterior circulation: Petrous segments patent bilaterally. Atheromatous change seen throughout the carotid siphons with associated mild to moderate narrowing. A1 segments patent bilaterally. Normal anterior communicating artery complex. Anterior cerebral arteries widely patent. No M1 stenosis or occlusion. No proximal MCA branch occlusion. Distal MCA branches perfused and symmetric. Posterior circulation: Both vertebral arteries patent without stenosis. Left vertebral artery dominant. Left PICA patent. Right PICA not well seen. Basilar patent to its distal aspect without stenosis. Superior cerebellar and posterior cerebral arteries widely patent bilaterally. Venous sinuses: Patent allowing for timing the contrast bolus. Anatomic variants: As above.  No aneurysm. Review of the MIP images confirms the above findings IMPRESSION: CT HEAD IMPRESSION: 1. No acute  intracranial abnormality. Previously identified small ischemic infarcts not visible by CT. No intracranial hemorrhage. 2. Underlying atrophy with chronic small vessel ischemic disease. CTA HEAD AND NECK IMPRESSION: 1. Negative CTA for large vessel occlusion or other emergent finding. 2. Mild-to-moderate atheromatous change about the carotid bifurcations and carotid siphons. No hemodynamically significant or correctable stenosis about the major arterial vasculature of the head and neck. 3. Extensive bulky upper mediastinal and left supraclavicular adenopathy, with multiple scattered pulmonary nodules/masses, progressed as compared to prior CTA from 05/27/2021. Electronically Signed   By: Jeannine Boga M.D.   On: 06/11/2021 22:52  ? ?CT Angio Chest Pulmonary Embolism (PE) W or WO Contrast ? ?Result Date: 06/11/2021 ?CLINICAL DATA:  History of endometrial cancer, diffuse metastatic disease EXAM: CT ANGIOGRAPHY CHEST WITH CONTRAST TECHNIQUE: Multidetector CT imaging of the chest was performed using the standard protocol during bolus administration of intravenous contrast. Multiplanar CT image reconstructions and MIPs were obtained to evaluate the vascular anatomy. RADIATION DOSE REDUCTION: This exam was performed according to the departmental dose-optimization program which includes automated exposure control, adjustment of the mA and/or kV according to patient size and/or use of iterative reconstruction technique. CONTRAST:  119m OMNIPAQUE IOHEXOL 350 MG/ML SOLN COMPARISON:  05/27/2021 FINDINGS: Cardiovascular: This is a technically adequate evaluation of the pulmonary vasculature. No filling defects or pulmonary emboli. The heart is unremarkable without pericardial effusion. No evidence of thoracic aortic aneurysm or dissection. Mediastinum/Nodes: There is diffuse lymphadenopathy seen within the  supraclavicular region, left axilla, mediastinum, and hila, which has progressed since prior exam. Left  supraclavicular lymph node mass measures 6.1 x 4.0 cm, previously measuring 5.1 x 2.2 cm. Anterior mediastinal soft tissue mass surrounding the origin of the great vessels now measures 7.0 x 4.1 cm, previously measurin

## 2021-06-12 NOTE — Progress Notes (Signed)
?  Echocardiogram ?2D Echocardiogram has been performed. ? ?Kristen Freeman ?06/12/2021, 1:53 PM ?

## 2021-06-12 NOTE — Progress Notes (Signed)
EEG complete - results pending 

## 2021-06-12 NOTE — Telephone Encounter (Signed)
Tuesday called regarding Recia being in the hospital.  Advised her that she will be getting 1 unit of blood today and she will continue being worked up for a stroke for the next 2 days.  Discussed that Dr. Alvy Bimler will see her in the hospital Wednesday morning.  Tuesday verbalized understanding and agreement. ?

## 2021-06-12 NOTE — Progress Notes (Signed)
?  Transition of Care (TOC) Screening Note ? ? ?Patient Details  ?Name: Kristen Freeman ?Date of Birth: 1950/06/11 ? ? ?Transition of Care (TOC) CM/SW Contact:    ?Cyndi Bender, RN ?Phone Number: ?06/12/2021, 2:10 PM ? ? ? ?Transition of Care Department [facility]) has reviewed patient and no TOC needs have been identified at this time. We will continue to monitor patient advancement through interdisciplinary progression rounds. If new patient transition needs arise, please place a TOC consult. ? ? ?

## 2021-06-12 NOTE — Progress Notes (Signed)
LTM EEG hooked up and running - no initial skin breakdown - push button tested - neuro notified. [facility] monitoring.  

## 2021-06-12 NOTE — Progress Notes (Signed)
?PROGRESS NOTE ? ?Kristen Freeman KGU:[id number] DOB: 1950-03-10 DOA: 06/11/2021 ?PCP: Allie Dimmer, MD ? ? LOS: 0 days  ? ?Brief Narrative / Interim history: ?71 year old female with history of endometrial cancer with mets, IVC thrombosis, pancytopenia, HTN, HLD, hemoptysis comes to the hospital with a syncopal episode.  This happened the night prior to admission, patient had an episode of a hot flash, nausea, and passed out.  Next thing she remembers the family standing over while she was lying in bed.  She also had an episode of vomiting afterwards, and family noted some blood in her emesis.  No seizure-like activity or postictal period observed.  She was brought to the hospital and admitted ? ?Subjective / 24h Interval events: ?She is doing well this morning, denies any chest pain, denies any shortness of breath.  Had some cough earlier this morning and had a small amount of hemoptysis which is normal for her in the past several weeks. ? ?Assesement and Plan: ?Active Problems: ?  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  Pancytopenia, acquired ([place]) ?  Glaucoma ?  HTN (hypertension) ?  HLD (hyperlipidemia) ?  Prediabetes ?  Syncope ?  Hematemesis ?  Cerebral thrombosis with cerebral infarction ? ?Assessment and Plan: ?Principal problem ?Acute CVA-MRI done on admission showed acute nonhemorrhagic 4 mm infarct in the posterior right middle cerebral peduncle as well as an acute nonhemorrhagic 10 mm infarct in the high posterior left frontal lobe.  Neurology consulted and followed patient while hospitalized.  She is undergoing a stroke work-up, CT angio head and neck was negative for large vessel occlusion. Hemoglobin A1c is 5.3.  Lipid panel showed an LDL of 102.  She is on atorvastatin.  2D echo pending.  Therapy evaluations pending.  Continue low-dose Eliquis that she was on at home ? ?Active problems ?Syncope-possibly due to #1.  She had recurrent episode while in the  ED yesterday.  Neurology following, getting an EEG, 2D echo, monitor on telemetry.  CT angiogram negative for PE. ? 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  Being treated by Dr. Alvy Bimler as an outpatient, currently receiving carboplatin.  There are plans in place for her to receive full dose carboplatin and paclitaxel in a few weeks.  Unfortunately CT scan of the chest done on admission shows worsening disease.   ? ?Hemoptysis-likely due to her lung metastasis.  Has been going on for several weeks but overall improving.  She was recently admitted 3/24-4/7, 2023 with hemoptysis, but appears to have improved since.  She underwent radiation therapy to her chest to help with this, but unfortunately CT scan shows worsening metastatic disease ? 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  Hemoptysis is low-grade at this point.  Discussed with oncology this morning, resume low-dose Eliquis and continue to closely monitor.  IR consulted during her prior hospitalization, she was not a candidate for thrombectomy or IVC filter placement ? ?Pancytopenia-multifactorial due to blood loss as well as effects from chemotherapy.  Continue to closely monitor, will transfuse unit of packed red blood cells today for hemoglobin of 7.0. ? ?Essential hypertension-allow permissive hypertension ? ?Hyperlipidemia-continue statin ?  ?Goals of care- palliative care consult ? ?Scheduled Meds: ? sodium chloride   Intravenous Once  ? acetaminophen  650 mg  Oral Once  ? apixaban  2.5 mg Oral BID  ? atorvastatin  20 mg Oral Daily  ? Chlorhexidine Gluconate Cloth  6 each Topical Daily  ? diphenhydrAMINE  25 mg Oral Once  ? latanoprost  1 drop Both Eyes QHS  ? pantoprazole (PROTONIX) IV  40 mg Intravenous BID  ? sodium chloride flush  10-40 mL Intracatheter  Q12H  ? sodium chloride flush  3 mL Intravenous Q12H  ? ?Continuous Infusions: ? lactated ringers 100 mL/hr at 06/12/21 0915  ? ?PRN Meds:.LORazepam, metoprolol tartrate, prochlorperazine, sodium chloride flush ? ?Diet Orders (From admission, onward)  ? ?  Start     Ordered  ? 06/11/21 1627  Diet Heart Room service appropriate? Yes; Fluid consistency: Thin  Diet effective now       ?Question Answer Comment  ?Room service appropriate? Yes   ?Fluid consistency: Thin   ?  ? 06/11/21 1626  ? ?  ?  ? ?  ? ? ?DVT prophylaxis: apixaban (ELIQUIS) tablet 2.5 mg Start: 06/12/21 2200 ?SCDs Start: 06/11/21 0934 ?apixaban (ELIQUIS) tablet 2.5 mg  ? ?Lab Results  ?Component Value Date  ? PLT 71 (L) 06/12/2021  ? ? ?  Code Status: Full Code ? ?Family Communication: none ? ?Status is: Observation ? ?The patient will require care spanning > 2 midnights and should be moved to inpatient because: Severity of illness, acute stroke, pancytopenia ? ?Level of care: Telemetry Medical ? ?Consultants:  ?Neurology  ?Hematology ? ?Procedures:  ?2D echo: pending ? ?Microbiology  ?none ? ?Antimicrobials: ?none  ? ? ?Objective: ?Vitals:  ? 06/11/21 2237 06/12/21 0000 06/12/21 0400 06/12/21 0746  ?BP: 136/90 118/75 113/75 114/75  ?Pulse: (!) 120 (!) 113 80 (!) 109  ?Resp: (!) '22 19 20 20  '$ ?Temp: 98.6 ?F (37 ?C) 98.2 ?F (36.8 ?C) 98.6 ?F (37 ?C) 98.1 ?F (36.7 ?C)  ?TempSrc: Oral Oral Oral Oral  ?SpO2: 99% 99% 98% 99%  ?Weight: 55.6 kg     ?Height:      ? ? ?Intake/Output Summary (Last 24 hours) at 06/12/2021 1132 ?Last data filed at 06/12/2021 0617 ?Gross per 24 hour  ?Intake 545.18 ml  ?Output 400 ml  ?Net 145.18 ml  ? ?Wt Readings from Last 3 Encounters:  ?06/11/21 55.6 kg  ?06/05/21 53.7 kg  ?05/24/21 55.8 kg  ? ? ?Examination: ? ?Constitutional: NAD ?Eyes: no scleral icterus ?ENMT: Mucous membranes are moist.  ?Neck: normal, supple ?Respiratory: clear to auscultation bilaterally, no wheezing, no crackles.  ?Cardiovascular: Regular rate and rhythm,  no murmurs / rubs / gallops.  ?Abdomen: non distended, no tenderness. Bowel sounds positive.  ?Musculoskeletal: no clubbing / cyanosis.  ?Skin: no rashes ?Neurologic: non focal  ? ?Data Reviewed: I have independently reviewed following labs and imaging studies  ? ?CBC ?Recent Labs  ?Lab 06/11/21 ?0414 06/12/21 ?0315  ?WBC 5.2 3.5*  ?HGB 8.1* 7.0*  ?HCT 24.9* 21.1*  ?PLT 93* 71*  ?MCV 101.2* 101.9*  ?MCH 32.9 33.8  ?MCHC 32.5 33.2  ?RDW 18.6* 19.5*  ?LYMPHSABS 0.2*  --   ?MONOABS 0.6  --   ?EOSABS 0.0  --   ?BASOSABS 0.0  --   ? ? ?Recent Labs  ?Lab 06/11/21 ?0414 06/11/21 ?7622 06/12/21 ?0315 06/12/21 ?1000  ?NA 135  --  136  --   ?K 3.7  --  3.5  --   ?CL 103  --  106  --   ?CO2 24  --  23  --   ?  GLUCOSE 146*  --  123*  --   ?BUN 10  --  8  --   ?CREATININE 0.55  --  0.56  --   ?CALCIUM 9.2  --  8.8*  --   ?AST 34  --  26  --   ?ALT 21  --  16  --   ?ALKPHOS 104  --  70  --   ?BILITOT 1.1  --  0.9  --   ?ALBUMIN 3.4*  --  2.6*  --   ?MG  --  2.1  --   --   ?HGBA1C  --   --   --  5.3  ? ? ?------------------------------------------------------------------------------------------------------------------ ?Recent Labs  ?  06/12/21 ?0315  ?CHOL 152  ?HDL 34*  ?LDLCALC 102*  ?TRIG 78  ?CHOLHDL 4.5  ? ? ?Lab Results  ?Component Value Date  ? HGBA1C 5.3 06/12/2021  ? ?------------------------------------------------------------------------------------------------------------------ ?No results for input(s): TSH, T4TOTAL, T3FREE, THYROIDAB in the last 72 hours. ? ?Invalid input(s): FREET3 ? ?Cardiac Enzymes ?No results for input(s): CKMB, TROPONINI, MYOGLOBIN in the last 168 hours. ? ?Invalid input(s): CK ?------------------------------------------------------------------------------------------------------------------ ?No results found for: BNP ? ?CBG: ?Recent Labs  ?Lab 06/11/21 ?1113 06/11/21 ?2009  ?GLUCAP 121* 144*  ? ? ?No results found for this or any previous visit (from the past 240 hour(s)).  ? ?Radiology  Studies: ?CT ANGIO HEAD NECK W WO CM ? ?Result Date: 06/11/2021 ?CLINICAL DATA:  Follow-up examination for acute stroke. EXAM: CT ANGIOGRAPHY HEAD AND NECK TECHNIQUE: Multidetector CT imaging of the head and nec

## 2021-06-13 ENCOUNTER — Telehealth: Payer: Self-pay | Admitting: Oncology

## 2021-06-13 DIAGNOSIS — I6330 Cerebral infarction due to thrombosis of unspecified cerebral artery: Secondary | ICD-10-CM | POA: Diagnosis not present

## 2021-06-13 DIAGNOSIS — D61818 Other pancytopenia: Secondary | ICD-10-CM | POA: Diagnosis not present

## 2021-06-13 DIAGNOSIS — I639 Cerebral infarction, unspecified: Secondary | ICD-10-CM | POA: Diagnosis not present

## 2021-06-13 DIAGNOSIS — I82220 Acute embolism and thrombosis of inferior vena cava: Secondary | ICD-10-CM | POA: Diagnosis not present

## 2021-06-13 DIAGNOSIS — C7800 Secondary malignant neoplasm of unspecified lung: Secondary | ICD-10-CM | POA: Diagnosis not present

## 2021-06-13 DIAGNOSIS — C541 Malignant neoplasm of endometrium: Secondary | ICD-10-CM | POA: Diagnosis not present

## 2021-06-13 DIAGNOSIS — Z515 Encounter for palliative care: Secondary | ICD-10-CM

## 2021-06-13 DIAGNOSIS — R000 Tachycardia, unspecified: Secondary | ICD-10-CM

## 2021-06-13 DIAGNOSIS — N179 Acute kidney failure, unspecified: Secondary | ICD-10-CM | POA: Diagnosis not present

## 2021-06-13 LAB — COMPREHENSIVE METABOLIC PANEL
ALT: 16 U/L (ref 0–44)
AST: 33 U/L (ref 15–41)
Albumin: 2.5 g/dL — ABNORMAL LOW (ref 3.5–5.0)
Alkaline Phosphatase: 81 U/L (ref 38–126)
Anion gap: 8 (ref 5–15)
BUN: 5 mg/dL — ABNORMAL LOW (ref 8–23)
CO2: 25 mmol/L (ref 22–32)
Calcium: 8.5 mg/dL — ABNORMAL LOW (ref 8.9–10.3)
Chloride: 102 mmol/L (ref 98–111)
Creatinine, Ser: 0.71 mg/dL (ref 0.44–1.00)
GFR, Estimated: >60 mL/min (ref >60)
Glucose, Bld: 187 mg/dL — ABNORMAL HIGH (ref 70–99)
Potassium: 2.9 mmol/L — ABNORMAL LOW (ref 3.5–5.1)
Sodium: 135 mmol/L (ref 135–145)
Total Bilirubin: 1.4 mg/dL — ABNORMAL HIGH (ref 0.3–1.2)
Total Protein: 5.7 g/dL — ABNORMAL LOW (ref 6.5–8.1)

## 2021-06-13 LAB — CBC
HCT: 26.5 % — ABNORMAL LOW (ref 36.0–46.0)
Hemoglobin: 8.7 g/dL — ABNORMAL LOW (ref 12.0–15.0)
MCH: 30.9 pg (ref 26.0–34.0)
MCHC: 32.8 g/dL (ref 30.0–36.0)
MCV: 94 fL (ref 80.0–100.0)
Platelets: 61 10*3/uL — ABNORMAL LOW (ref 150–400)
RBC: 2.82 MIL/uL — ABNORMAL LOW (ref 3.87–5.11)
RDW: 25.5 % — ABNORMAL HIGH (ref 11.5–15.5)
WBC: 3.7 10*3/uL — ABNORMAL LOW (ref 4.0–10.5)
nRBC: 0 % (ref 0.0–0.2)

## 2021-06-13 LAB — TYPE AND SCREEN
ABO/RH(D): O POS
Antibody Screen: NEGATIVE
Unit division: 0

## 2021-06-13 LAB — BPAM RBC
Blood Product Expiration Date: 202304242359
ISSUE DATE / TIME: 202304171206
Unit Type and Rh: 5100

## 2021-06-13 LAB — GLUCOSE, CAPILLARY: Glucose-Capillary: 109 mg/dL — ABNORMAL HIGH (ref 70–99)

## 2021-06-13 MED ORDER — ORAL CARE MOUTH RINSE
15.0000 mL | Freq: Two times a day (BID) | OROMUCOSAL | Status: DC
  Administered 2021-06-13 – 2021-06-14 (×2): 15 mL via OROMUCOSAL

## 2021-06-13 MED ORDER — POTASSIUM CHLORIDE 10 MEQ/100ML IV SOLN
10.0000 meq | INTRAVENOUS | Status: AC
  Administered 2021-06-13 (×2): 10 meq via INTRAVENOUS
  Filled 2021-06-13 (×2): qty 100

## 2021-06-13 MED ORDER — METOPROLOL TARTRATE 50 MG PO TABS
50.0000 mg | ORAL_TABLET | Freq: Two times a day (BID) | ORAL | Status: DC
  Administered 2021-06-13 – 2021-06-14 (×3): 50 mg via ORAL
  Filled 2021-06-13 (×3): qty 1

## 2021-06-13 MED ORDER — POTASSIUM CHLORIDE CRYS ER 20 MEQ PO TBCR
30.0000 meq | EXTENDED_RELEASE_TABLET | ORAL | Status: AC
  Administered 2021-06-13 (×2): 30 meq via ORAL
  Filled 2021-06-13 (×2): qty 1

## 2021-06-13 NOTE — Evaluation (Signed)
Speech Language Pathology Evaluation ?Patient Details ?Name: Kristen Freeman ?MRN: [id number] ?DOB: 06-Dec-1950 ?Today's Date: 06/13/2021 ?Time: 3614-4315 ?SLP Time Calculation (min) (ACUTE ONLY): 23 min ? ?Problem List:  ?Patient Active Problem List  ? Diagnosis Date Noted  ? Sinus tachycardia 06/13/2021  ? CVA (cerebral vascular accident) ([place]) 06/13/2021  ? Cerebral thrombosis with cerebral infarction 06/12/2021  ? AKI (acute kidney injury) ([place]) 06/12/2021  ? Syncope 06/11/2021  ? Hematemesis 06/11/2021  ? Port-A-Cath in place 06/05/2021  ? Mild protein-calorie malnutrition ([place]) 06/05/2021  ? Other constipation 05/31/2021  ? 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 Hypokalemia 05/30/2021  ? Pancytopenia, acquired ([place]) 05/30/2021  ? Malignant neoplasm metastatic to both lungs [facility])   ? 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 Acute blood loss anemia 05/22/2021  ? Malignant neoplasm metastatic to lung [facility]) 05/22/2021  ? HLD (hyperlipidemia) 05/20/2021  ? Glaucoma 05/20/2021  ? Hemoptysis 05/20/2021  ? Normocytic anemia 05/20/2021  ? 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 HTN (hypertension) 05/19/2021  ? Thrombocytopenia ([place]) 07/12/2020  ? Prediabetes 08/02/2016  ? Iron deficiency anemia 04/04/2000  ? ?Past Medical History:  ?Past Medical History:  ?Diagnosis Date  ? Endometrial cancer ([place])   ? dx 2019 tx'd; recurrence 12/22 - XRT x 30, Chem 03/21/21 to 04/25/21  ? Glaucoma   ? HLD (hyperlipidemia)   ? on statin  ? HTN (hypertension)   ? ?Past Surgical History:  ?Past Surgical History:  ?Procedure Laterality Date  ? TM repair N/A   ? TONSILLECTOMY    ? TOTAL ABDOMINAL HYSTERECTOMY W/ BILATERAL SALPINGOOPHORECTOMY    ? TOTAL KNEE ARTHROPLASTY Left   ? ?HPI:  ?Patient is a 71 y.o. female with PMH: endometrial cancer with mets s/p chemotherapy and radiation, IVC thrombosis, pancytopenia, glaucoma, HTN, HLD, hemoptysis. She presented to the  hospital with syncope followed by vomiting and patient noting blood in vomit. EMS was called and patient taken to ED. MRI revealed 4 mm infarct in the posterior right middle cerebellar peduncle and acute nonhemorrhagic 28m infarct in the high posterior left frontal lobe.  ? ?Assessment / Plan / Recommendation ?Clinical Impression ? Patient is presenting with a mild cognitive impairment as per this evaluation, however she may be at or near baseline cognitive function as per sister's report. Of note, portion of evaluation completed with distraction from patient having EEG leads taken off of her head. 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 She was fully oriented to time/place/situation. When answering question involving mental math calculations, patient seemed to get disorganized by information and said "Im so confused!" She repeated several items when completing divergent naming task without showing awareness. Patient appears with adequate awareness overall, speech and language are both WFL-WNL and as she will have more than enough family support, SLP not recommending further skilled services but did recommend to patient and sister for her to continue to keep mentally active and although help with medication management (pill box organizing, etc) would be beneficial, for patient to continue having an active role in these tasks. ?   ?SLP Assessment ? SLP Recommendation/Assessment: Patient does not need any further [facility] ?SLP Visit Diagnosis: Cognitive communication deficit (R41.841)  ?  ?Recommendations for follow up therapy are one component of a multi-disciplinary discharge planning process, led by the attending physician.  Recommendations may be updated based on patient status, additional functional criteria and insurance authorization. ?   ?Follow Up  Recommendations ? No SLP follow up  ?  ?Assistance Recommended at Discharge ? Intermittent Supervision/Assistance  ?Functional Status Assessment Patient has had a recent decline in their functional status and demonstrates the ability to make significant improvements in function in a reasonable and predictable amount of time.  ?Frequency and Duration    ? N/A ?  ?   ?SLP Evaluation ?Cognition ? Overall Cognitive Status: Impaired/Different from baseline ?Arousal/Alertness: Awake/alert ?Orientation Level: Oriented X4 ?Year: 2023 ?Month: April ?Day of Week: Correct ?Attention: Selective ?Selective Attention: Impaired ?Selective Attention Impairment: Verbal complex ?Memory: Impaired ?Memory Impairment: Storage deficit;Retrieval deficit ?Awareness: Appears intact ?Problem Solving: Impaired ?Problem Solving Impairment: Verbal complex ?Safety/Judgment: Appears intact  ?  ?   ?Comprehension ? Auditory Comprehension ?Overall Auditory Comprehension: Appears within functional limits for tasks assessed  ?  ?Expression Expression ?Primary Mode of Expression: Verbal ?Verbal Expression ?Overall Verbal Expression: Appears within functional limits for tasks assessed ?Written Expression ?Dominant Hand: Right   ?Oral / Motor ? Oral Motor/Sensory Function ?Overall Oral Motor/Sensory Function: Within functional limits ?Motor Speech ?Overall Motor Speech: Appears within functional limits for tasks assessed ?Respiration: Within functional limits ?Resonance: Within functional limits ?Articulation: Within functional limitis ?Intelligibility: Intelligible ?Motor Planning: Witnin functional limits ?Motor Speech Errors: Not applicable   ?        ? ?Sonia Baller, MA, CCC-SLP ?Speech Therapy ? ? ?

## 2021-06-13 NOTE — Procedures (Addendum)
Patient Name: Kristen Freeman  ?MRN: [id number]  ?Epilepsy Attending: Lora Havens  ?Referring Physician/Provider: Greta Doom, MD ?Duration: 06/12/2021 7510 to 06/13/2021 0915 ?  ?Patient history: 71 year old female with episodes of left face due to sensation followed by loss of consciousness.  EEG to evaluate for seizure. ?  ?Level of alertness: Awake, asleep ?  ?AEDs during EEG study: None ?  ?Technical aspects: This EEG study was done with scalp electrodes positioned according to the 10-20 International system of electrode placement. Electrical activity was acquired at a sampling rate of '500Hz'$  and reviewed with a high frequency filter of '70Hz'$  and a low frequency filter of '1Hz'$ . EEG data were recorded continuously and digitally stored.  ?  ?Description: The posterior dominant rhythm consists of 8-9 Hz activity of moderate voltage (25-35 uV) seen predominantly in posterior head regions, symmetric and reactive to eye opening and eye closing. Hyperventilation and photic stimulation were not performed.    ? ?Patient reported an episode of tooth pain radiating up to her eye around 1100 on 06/12/2021.  Concomitant EEG before, during and after the event did not show any EEG change to suggest seizure. ?  ?IMPRESSION: ?This study is within normal limits. No seizures or epileptiform discharges were seen throughout the recording. ? ?One event was recorded around 1100 on 06/12/2021 as described above without concomitant EEG change.  This was most likely not an epileptic event. ?  ?Lora Havens  ?

## 2021-06-13 NOTE — Progress Notes (Addendum)
?PROGRESS NOTE ? ?Kristen Freeman YOV:[id number] DOB: 1950/04/12 DOA: 06/11/2021 ?PCP: Allie Dimmer, MD ? ? LOS: 1 day  ? ?Brief Narrative / Interim history: ?71 year old female with history of endometrial cancer with mets, IVC thrombosis, pancytopenia, HTN, HLD, hemoptysis comes to the hospital with a syncopal episode.  This happened the night prior to admission, patient had an episode of a hot flash, nausea, and passed out.  Next thing she remembers the family standing over while she was lying in bed.  She also had an episode of vomiting afterwards, and family noted some blood in her emesis.  No seizure-like activity or postictal period observed.  She was brought to the hospital and admitted ? ?Subjective / 24h Interval events: ?Eating breakfast.  She denies any chest pain.  No shortness of breath.  Had an episode of hemoptysis this morning, similar to what she has been having in the last several weeks, very small quantity of blood ? ?Assesement and Plan: ?Principal Problem: ?  AKI (acute kidney injury) ([place]) ?Active Problems: ?  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  Pancytopenia, acquired ([place]) ?  Glaucoma ?  HTN (hypertension) ?  HLD (hyperlipidemia) ?  Prediabetes ?  Syncope ?  Hematemesis ?  Cerebral thrombosis with cerebral infarction ? ?Assessment and Plan: ?Principal problem ?Acute CVA-MRI done on admission showed acute nonhemorrhagic 4 mm infarct in the posterior right middle cerebral peduncle as well as an acute nonhemorrhagic 10 mm infarct in the high posterior left frontal lobe.  Neurology consulted and followed patient while hospitalized.  She is undergoing a stroke work-up, CT angio head and neck was negative for large vessel occlusion. Hemoglobin A1c is 5.3.  Lipid panel showed an LDL of 102.  She is on atorvastatin.  2D echo shows EF 65-70%, no WMA, grade 2 diastolic dysfunction.  RV was normal.  Therapy evaluations pending.  Continue low-dose Eliquis  that she was on at home ? ?Active problems ?Syncope-possibly due to #1.  She had recurrent episode while in the ED right before admission.  CT angiogram negative for PE. Neurology following, continues EEG this morning without seizures.  No further episodes ? 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  Being treated by Dr. Alvy Bimler as an outpatient, currently receiving carboplatin.  There are plans in place for her to receive full dose carboplatin and paclitaxel in a few weeks.  Unfortunately CT scan of the chest done on admission shows worsening disease.  Ongoing goals of care discussions, Dr. Alvy Bimler plans to meet the patient and her family tomorrow morning ? ?Hemoptysis-likely due to her lung metastasis.  Has been going on for several weeks but overall improving.  She was recently admitted 3/24-4/7, 2023 with hemoptysis, but appears to have improved since.  She underwent radiation therapy to her chest to help with this, but unfortunately CT scan shows worsening metastatic disease.  Hemoptysis is stable, low-grade ? ?Hypokalemia-potassium 2.9 this morning.  Replete. ? ?Sinus tachycardia-slightly worse today, with rates into the 120s-130s.  Home beta-blockers were held due to her acute CVA, resume metoprolol today.  Tachycardia also is likely worsened in the setting of metastatic burden ? 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  Hemoptysis is low-grade at this point.  Discussed with oncology this morning, resume low-dose  Eliquis and continue to closely monitor.  IR consulted during her prior hospitalization, she was not a candidate for thrombectomy or IVC filter placement ? ?Pancytopenia-multifactorial due to blood loss as well as effects from chemotherapy.  Continue to monitor.  She was transfused unit of packed red  blood cells 4/17 for hemoglobin of 7.0, improved appropriately this morning and appears stable. ? ?Essential hypertension-allow permissive hypertension in the setting of acute CVA ? ?Hyperlipidemia-continue statin ?  ?Goals of care- palliative care consult ? ?Scheduled Meds: ? apixaban  2.5 mg Oral BID  ? atorvastatin  20 mg Oral Daily  ? Chlorhexidine Gluconate Cloth  6 each Topical Daily  ? latanoprost  1 drop Both Eyes QHS  ? metoprolol tartrate  50 mg Oral BID  ? pantoprazole (PROTONIX) IV  40 mg Intravenous BID  ? polyethylene glycol  17 g Oral Daily  ? potassium chloride  30 mEq Oral Q3H  ? senna  1 tablet Oral Daily  ? sodium chloride flush  10-40 mL Intracatheter Q12H  ? sodium chloride flush  3 mL Intravenous Q12H  ? ?Continuous Infusions: ? lactated ringers 100 mL/hr at 06/13/21 0522  ? potassium chloride    ? ?PRN Meds:.LORazepam, metoprolol tartrate, prochlorperazine, sodium chloride flush ? ?Diet Orders (From admission, onward)  ? ?  Start     Ordered  ? 06/11/21 1627  Diet Heart Room service appropriate? Yes; Fluid consistency: Thin  Diet effective now       ?Question Answer Comment  ?Room service appropriate? Yes   ?Fluid consistency: Thin   ?  ? 06/11/21 1626  ? ?  ?  ? ?  ? ? ?DVT prophylaxis: apixaban (ELIQUIS) tablet 2.5 mg Start: 06/12/21 2200 ?SCDs Start: 06/11/21 0934 ?apixaban (ELIQUIS) tablet 2.5 mg  ? ?Lab Results  ?Component Value Date  ? PLT 61 (L) 06/13/2021  ? ? ?  Code Status: Full Code ? ?Family Communication: none ? ?Status is: Inpatient ? ?Level of care: Telemetry Medical ? ?Consultants:  ?Neurology  ?Hematology ? ?Procedures:  ?2D echo: pending ? ?Microbiology  ?none ? ?Antimicrobials: ?none  ? ? ?Objective: ?Vitals:  ? 06/13/21 0000 06/13/21 0400 06/13/21 0758 06/13/21 1048  ?BP: (!) 168/92 (!) 145/94 (!) 147/94 134/85  ?Pulse: (!) 114 96 83 (!) 119  ?Resp: '20 20 16   '$ ?Temp: 97.8 ?F (36.6 ?C) 97.8 ?F (36.6 ?C) 98.7 ?F (37.1 ?C)   ?TempSrc: Oral Oral Oral   ?SpO2: 97% 98% 98%    ?Weight:  55.4 kg    ?Height:      ? ? ?Intake/Output Summary (Last 24 hours) at 06/13/2021 1103 ?Last data filed at 06/12/2021 1524 ?Gross per 24 hour  ?Intake 386 ml  ?Output --  ?Net 386 ml  ? ? ?Wt Readings from Last 3 Encounters:  ?06/13/21 55.4 kg  ?06/05/21 53.7 kg  ?05/24/21 55.8 kg  ? ? ?Examination: ? ?Constitutional: NAD ?Eyes: lids and conjunctivae normal, no scleral icterus ?ENMT: mmm ?Neck: normal, supple ?Respiratory: clear to auscultation bilaterally, no wheezing, no crackles. Normal respiratory effort.  ?Cardiovascular: Regular rate and rhythm, no murmurs / rubs / gallops. No LE edema. ?Abdomen: soft, no distention, no tenderness. Bowel sounds positive.  ?Skin: no rashes ?Neurologic: no focal deficits, equal strength ? ?Data Reviewed: I have independently reviewed following labs and imaging studies  ? ?CBC ?Recent Labs  ?Lab 06/11/21 ?0414 06/12/21 ?0315 06/12/21 ?1937 06/13/21 ?0177  ?WBC 5.2 3.5*  --  3.7*  ?HGB 8.1* 7.0* 8.2* 8.7*  ?  HCT 24.9* 21.1* 25.4* 26.5*  ?PLT 93* 71*  --  61*  ?MCV 101.2* 101.9*  --  94.0  ?MCH 32.9 33.8  --  30.9  ?MCHC 32.5 33.2  --  32.8  ?RDW 18.6* 19.5*  --  25.5*  ?LYMPHSABS 0.2*  --   --   --   ?MONOABS 0.6  --   --   --   ?EOSABS 0.0  --   --   --   ?BASOSABS 0.0  --   --   --   ? ? ? ?Recent Labs  ?Lab 06/11/21 ?0414 06/11/21 ?0648 06/12/21 ?0315 06/12/21 ?1000 06/13/21 ?4888  ?NA 135  --  136  --  135  ?K 3.7  --  3.5  --  2.9*  ?CL 103  --  106  --  102  ?CO2 24  --  23  --  25  ?GLUCOSE 146*  --  123*  --  187*  ?BUN 10  --  8  --  5*  ?CREATININE 0.55  --  0.56  --  0.71  ?CALCIUM 9.2  --  8.8*  --  8.5*  ?AST 34  --  26  --  33  ?ALT 21  --  16  --  16  ?ALKPHOS 104  --  70  --  81  ?BILITOT 1.1  --  0.9  --  1.4*  ?ALBUMIN 3.4*  --  2.6*  --  2.5*  ?MG  --  2.1  --   --   --   ?HGBA1C  --   --   --  5.3  --   ? ? ? ?------------------------------------------------------------------------------------------------------------------ ?Recent Labs  ?   06/12/21 ?0315  ?CHOL 152  ?HDL 34*  ?LDLCALC 102*  ?TRIG 78  ?CHOLHDL 4.5  ? ? ? ?Lab Results  ?Component Value Date  ? HGBA1C 5.3 06/12/2021  ? ?----------------------------------------------------------------------------

## 2021-06-13 NOTE — Evaluation (Signed)
Physical Therapy Evaluation and Discharge ?Patient Details ?Name: Dequita Schleicher ?MRN: [id number] ?DOB: [birth year] ?Today's Date: 06/13/2021 ? ?History of Present Illness ? Ms. Litzi Binning is a 71 y.o. 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 Given her IVC thrombus which was not felt to be candidate for filter or surgery, oncology recently restarted Eliquis.  DVT noted in right popliteal vein. MRI revealed 4 mm infarct in the posterior right middle cerebellar peduncle, Acute nonhemorrhagic 10 mm infarct in the high posterior left frontal lobe.  ?Clinical Impression ?  ?Patient evaluated by Physical Therapy with no further acute PT needs identified. Patient scored 23/24 on Dynamic Gait INdex and 50/56 on Berg BAlance Assessment. Patient is independent with functional mobility. PT is signing off. Thank you for this referral. ?   ?   ? ?Recommendations for follow up therapy are one component of a multi-disciplinary discharge planning process, led by the attending physician.  Recommendations may be updated based on patient status, additional functional criteria and insurance authorization. ? ?Follow Up Recommendations No PT follow up ? ?  ?Assistance Recommended at Discharge PRN  ?Patient can return home with the following ? Assistance with cooking/housework ? ?  ?Equipment Recommendations None recommended by PT  ?Recommendations for Other Services ?    ?  ?Functional Status Assessment Patient has not had a recent decline in their functional status  ? ?  ?Precautions / Restrictions Precautions ?Precautions: Fall ?Restrictions ?Weight Bearing Restrictions: No  ? ?  ? ?Mobility ? Bed Mobility ?Overal bed mobility: Independent ?  ?  ?  ?  ?  ?  ?  ?   ? ?Transfers ?Overall transfer level: Independent ?Equipment used: None ?Transfers: Sit to/from Stand ?Sit to Stand: Independent ?  ?  ?  ?  ?  ?  ?  ? ?Ambulation/Gait ?Ambulation/Gait assistance: Independent ?Gait Distance (Feet): 180 Feet ?  ?Gait Pattern/deviations: WFL(Within Functional Limits), Step-through pattern, Decreased stride length ?  ?Gait velocity interpretation: >2.62 ft/sec, indicative of community ambulatory ?  ?General Gait Details: overall steady gait WFL's. no LOB noted throughout ? ?Stairs ?  ?  ?  ?  ?  ? ?Wheelchair Mobility ?  ? ?Modified Rankin (Stroke Patients Only) ?  ? ?  ? ?Balance Overall balance assessment: Independent ?  ?  ?  ?  ?  ?  ?  ?  ?  ?  ?  ?  ?  ?  ?  ?Standardized Balance Assessment ?Standardized Balance Assessment : Berg Balance Test, Dynamic Gait Index ?Berg Balance Test ?Sit to Stand: Able to stand without using hands and stabilize independently ?Standing Unsupported: Able to stand safely 2 minutes ?Sitting with Back Unsupported but Feet Supported on Floor or Stool: Able to sit safely and securely 2 minutes ?Stand to Sit: Sits safely with minimal use of hands ?Transfers: Able to transfer safely, minor use of hands ?Standing Unsupported with Eyes Closed: Able to stand 10 seconds safely ?Standing Ubsupported with Feet Together: Able to place feet together independently and stand 1 minute safely ?From Standing, Reach Forward with Outstretched Arm: Can reach confidently >25 cm (10") ?From Standing Position, Pick up Object from Floor: Able to pick up shoe safely and easily ?From Standing Position, Turn to Look Behind Over each Shoulder: Looks behind  one side only/other side shows less weight shift ?Turn 360 Degrees: Able to turn 360 degrees safely in 4 seconds or less ?Standing Unsupported, Alternately Place Feet on Step/Stool: Able to complete >2 steps/needs minimal assist ?Standing Unsupported, One Foot in Front: Able to plae foot ahead of the other independently and  hold 30 seconds ?Standing on One Leg: Able to lift leg independently and hold 5-10 seconds ?Total Score: 50 ?Dynamic Gait Index ?Level Surface: Normal ?Change in Gait Speed: Normal ?Gait with Horizontal Head Turns: Normal ?Gait with Vertical Head Turns: Normal ?Gait and Pivot Turn: Normal ?Step Over Obstacle: Mild Impairment ?Step Around Obstacles: Normal ?Steps: Normal ?Total Score: 23 ?   ? ? ? ?Pertinent Vitals/Pain Pain Assessment ?Pain Assessment: No/denies pain  ? ? ?Home Living Family/patient expects to be discharged to:: Private residence ?Living Arrangements: Other (Comment) (will discharge to her sister's) ?Available Help at Discharge: Family ?Type of Home: House ?Home Access: Stairs to enter ?Entrance Stairs-Rails: None ?Entrance Stairs-Number of Steps: 1 ?Alternate Level Stairs-Number of Steps: 11 ?Home Layout: Multi-level;Full bath on main level;Able to live on main level with bedroom/bathroom;Other (Comment) (would have to sleep on pull out sofa, but has been going upstairs to her bedroom so far) ?Home Equipment: Standard Walker ?   ?  ?Prior Function Prior Level of Function : Needs assist ?  ?  ?  ?  ?  ?  ?Mobility Comments: independent with ambulation ?ADLs Comments: Supervision to min assist for showering from chemo ?  ? ? ?Hand Dominance  ? Dominant Hand: Right ? ?  ?Extremity/Trunk Assessment  ? Upper Extremity Assessment ?Upper Extremity Assessment: Defer to OT evaluation ?  ? ?Lower Extremity Assessment ?Lower Extremity Assessment: Overall WFL for tasks assessed ?  ? ?Cervical / Trunk Assessment ?Cervical / Trunk Assessment: Normal  ?Communication  ? Communication: No difficulties  ?Cognition Arousal/Alertness: Awake/alert ?Behavior During Therapy: [facility] for tasks assessed/performed ?Overall Cognitive Status: Within Functional Limits for tasks assessed ?  ?  ?  ?  ?  ?  ?  ?  ?  ?  ?  ?  ?  ?  ?  ?  ?  ?  ?  ? ?  ?General Comments General comments (skin integrity, edema, etc.): Sister present ? ?   ?Exercises    ? ?Assessment/Plan  ?  ?PT Assessment Patient does not need any further PT services  ?PT Problem List   ? ?   ?  ?PT Treatment Interventions     ? ?PT Goals (Current goals can be found in the Care Plan section)  ?Acute Rehab PT Goals ?Patient Stated Goal: get home ?PT Goal Formulation: All assessment and education complete, DC therapy ? ?  ?Frequency  (1x eval) ?  ? ? ?Co-evaluation   ?  ?  ?  ?  ? ? ?  ?AM-PAC PT "6 Clicks" Mobility  ?Outcome Measure Help needed turning from your back to your side while in a flat bed without using bedrails?: None ?Help needed moving from lying on your back to sitting on the side of a flat bed without using bedrails?: None ?Help needed moving to and from a bed to a chair (including a wheelchair)?: None ?Help needed standing up from a chair using your arms (e.g., wheelchair or bedside chair)?: None ?Help needed to walk in hospital room?: None ?Help needed climbing 3-5 steps with a railing? : None ?6 Click Score: 24 ? ?  ?End of Session Equipment Utilized During Treatment: Gait  belt ?Activity Tolerance: Patient tolerated treatment well ?Patient left: in chair;with call bell/phone within reach;with family/visitor present ?Nurse Communication: Mobility status ?PT Visit Diagnosis: Unsteadiness on feet (R26.81);Muscle weakness (generalized) (M62.81);Difficulty in walking, not elsewhere classified (R26.2) ?  ? ?Time: 9012-2241 ?PT Time Calculation (min) (ACUTE ONLY): 20 min ? ? ?Charges:   PT Evaluation ?$PT Eval Low Complexity: 1 Low ?  ?  ?   ? ? ? ?Arby Barrette, PT ?Acute Rehabilitation Services  ?Pager [phone] ?Office [phone] ? ? ?Jeanie Cooks Monterio Bob ?06/13/2021, 12:55 PM ? ?

## 2021-06-13 NOTE — Progress Notes (Signed)
LTM EEG discontinued - no skin breakdown at unhook.   

## 2021-06-13 NOTE — Evaluation (Signed)
Occupational Therapy Evaluation ?Patient Details ?Name: Kristen Freeman ?MRN: [id number] ?DOB: 10-10-1950 ?Today's Date: 06/13/2021 ? ? ?History of Present Illness [name] is a 71 y.o. 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 Given her IVC thrombus which was not felt to be candidate for filter or surgery, oncology recently restarted Eliquis.  DVT noted in right popliteal vein. MRI revealed 4 mm infarct in the posterior right middle cerebellar peduncle, Acute nonhemorrhagic 10 mm infarct in the high posterior left frontal lobe.  ? ?Clinical Impression ?  ?Pt currently at supervision to modified independent for simulated selfcare tasks and functional transfers to and from the regular toilet without an assistive device.  Pt currently supervision more for managing EEG lines, cardiac lines, and IV.   HR at 120 BPM at rest, increasing to 135 with transfer to the toilet.  This decreased down to 110 BPM at conclusion of session.  Oxygen sats remained at 97% throughout session on room air.  Feel she will have PRN supervision at discharge and no noted deficits seen from new CVAs warranting further OT at this time.  Will sign off.   ?   ? ?Recommendations for follow up therapy are one component of a multi-disciplinary discharge planning process, led by the attending physician.  Recommendations may be updated based on patient status, additional functional criteria and insurance authorization.  ? ?Follow Up Recommendations ? No OT follow up  ?  ?Assistance Recommended at Discharge PRN  ?Patient can return home with the following A little help with bathing/dressing/bathroom;Assistance with cooking/housework;Help with stairs or ramp for entrance;Assist for  transportation ? ?  ?Functional Status Assessment ? Patient has not had a recent decline in their functional status  ?Equipment Recommendations ? Other (comment) (Family will obtain shower seat for use of home)  ?  ?   ?Precautions / Restrictions Precautions ?Precautions: Fall ?Restrictions ?Weight Bearing Restrictions: No  ? ?  ? ?Mobility Bed Mobility ?Overal bed mobility: Independent ?  ?  ?  ?  ?  ?  ?  ?  ? ?Transfers ?Overall transfer level: Needs assistance ?  ?Transfers: Sit to/from Stand, Bed to chair/wheelchair/BSC ?Sit to Stand: Supervision ?  ?  ?Step pivot transfers: Supervision ?  ?  ?General transfer comment: Pt supervision for ambulation to the toilet without an assistive device. ?  ? ?  ?Balance Overall balance assessment: Mild deficits observed, not formally tested ?  ?  ?  ?  ?  ?  ?  ?  ?  ?  ?  ?  ?  ?  ?  ?  ?  ?  ?   ? ?ADL either performed or assessed with clinical judgement  ? ?ADL Overall ADL's : At baseline ?  ?  ?  ?  ?  ?  ?  ?  ?  ?  ?  ?  ?  ?  ?  ?  ?  ?  ?  ?General ADL Comments: Pt currently modified independent to supervision for toilet transfers without an assistive device and for selfcare tasks sit to stand.  Currently with EEG monitoring, IV, and cardiac monitoring so supervision/assist needed to manage lines.  No LOB noted with completion of toilet transfer hower resting HR at 120 and increasing to  135 with transfer to the toilet.  Oxygen sats maintained at 97% on room air throughout with HR decreasing to 110 with transfer back to the bed.  Recommended use of a shower seat at home for safety secondary to endurance issues after chemo sessions.  No further acute or post acute OT needs at this time.  ? ? ? ?Vision Baseline Vision/History: 1 Wears glasses (reading only) ?Ability to See in Adequate Light: 0 Adequate ?Patient Visual Report: No change from baseline ?Vision Assessment?: Yes ?Eye Alignment: Within Functional Limits ?Ocular Range of Motion: Within Functional  Limits ?Alignment/Gaze Preference: Within Defined Limits ?Tracking/Visual Pursuits: Able to track stimulus in all quads without difficulty ?Saccades: Within functional limits ?Convergence: Within functional limits ?Visual Fields: No apparent deficits  ?   ?Perception Perception ?Perception: Within Functional Limits ?  ?Praxis Praxis ?Praxis: Intact ?  ? ?Pertinent Vitals/Pain Pain Assessment ?Pain Assessment: No/denies pain  ? ? ? ?Hand Dominance Right ?  ?Extremity/Trunk Assessment Upper Extremity Assessment ?Upper Extremity Assessment: Overall WFL for tasks assessed ?  ?Lower Extremity Assessment ?Lower Extremity Assessment: Defer to PT evaluation ?  ?Cervical / Trunk Assessment ?Cervical / Trunk Assessment: Normal ?  ?Communication Communication ?Communication: No difficulties ?  ?Cognition Arousal/Alertness: Awake/alert ?Behavior During Therapy: [facility] for tasks assessed/performed ?Overall Cognitive Status: Within Functional Limits for tasks assessed ?  ?  ?  ?  ?  ?  ?  ?  ?  ?  ?  ?  ?  ?  ?  ?  ?  ?  ?  ?General Comments  Pt without LOB with sit to stand and mobility to and from the bathroom. ? ?  ?   ?   ? ? ?Home Living Family/patient expects to be discharged to:: Private residence ?Living Arrangements: Other (Comment) (will discharge to her sister's) ?Available Help at Discharge: Family ?Type of Home: House ?Home Access: Stairs to enter ?Entrance Stairs-Number of Steps: 1 ?Entrance Stairs-Rails: None ?Home Layout: Multi-level;Full bath on main level;Able to live on main level with bedroom/bathroom;Other (Comment) (would have to sleep on pull out sofa, but has been going upstairs to her bedroom so far) ?Alternate Level Stairs-Number of Steps: 11 ?Alternate Level Stairs-Rails: Can reach both ?Bathroom Shower/Tub: Gaffer;Tub/shower unit ?  ?Bathroom Toilet: Standard ?Bathroom Accessibility: Yes ?  ?Home Equipment: Standard Walker ?  ?  ?  ? ?  ?Prior Functioning/Environment Prior Level of Function :  Needs assist ?  ?  ?  ?  ?  ?  ?  ?ADLs Comments: Supervision to min assist for showering from chemo ?  ? ?   ?   ?   ?   ?   ?   ? ?   ?AM-PAC OT "6 Clicks" Daily Activity     ?Outcome Measure Help from another person eating meals?: None ?Help from another person taking care of personal grooming?: None ?Help from another person toileting, which includes using toliet, bedpan, or urinal?: A Little ?Help from another person bathing (including washing, rinsing, drying)?: A Little ?Help from another person to put on and taking off regular upper body clothing?: None ?Help from another person to put on and taking off regular lower body clothing?: None ?6 Click Score: 22 ?  ?End of Session Nurse Communication: Mobility status ? ?Activity Tolerance: Patient tolerated treatment well ?Patient left: in bed;with call bell/phone within reach;with bed alarm set;with family/visitor present ? ?   ?              ?  Time: 0211-1735 ?OT Time Calculation (min): 41 min ?Charges:  OT General Charges ?$OT Visit: 1 Visit ?OT Evaluation ?$OT Eval Moderate Complexity: 1 Mod ?OT Treatments ?$Self Care/Home Management : 23-37 mins ? ?Rohaan Durnil OTR/L ?06/13/2021, 12:25 PM ?

## 2021-06-13 NOTE — Progress Notes (Signed)
STROKE TEAM PROGRESS NOTE  ? ?INTERVAL HISTORY ?Her sister is at the bedside.  EEG leads on head. No events overnight. No new complaints.  ?Vitals:  ? 06/13/21 0400 06/13/21 0758 06/13/21 1048 06/13/21 1100  ?BP: (!) 145/94 (!) 147/94 134/85   ?Pulse: 96 83 (!) 119   ?Resp: 20 16    ?Temp: 97.8 ?F (36.6 ?C) 98.7 ?F (37.1 ?C)    ?TempSrc: Oral Oral    ?SpO2: 98% 98%  98%  ?Weight: 55.4 kg     ?Height:      ? ?CBC:  ?Recent Labs  ?Lab 06/11/21 ?0414 06/12/21 ?0315 06/12/21 ?1937 06/13/21 ?6440  ?WBC 5.2 3.5*  --  3.7*  ?NEUTROABS 4.3  --   --   --   ?HGB 8.1* 7.0* 8.2* 8.7*  ?HCT 24.9* 21.1* 25.4* 26.5*  ?MCV 101.2* 101.9*  --  94.0  ?PLT 93* 71*  --  61*  ? ? ?Basic Metabolic Panel:  ?Recent Labs  ?Lab 06/11/21 ?0648 06/12/21 ?0315 06/13/21 ?3474  ?NA  --  136 135  ?K  --  3.5 2.9*  ?CL  --  106 102  ?CO2  --  23 25  ?GLUCOSE  --  123* 187*  ?BUN  --  8 5*  ?CREATININE  --  0.56 0.71  ?CALCIUM  --  8.8* 8.5*  ?MG 2.1  --   --   ? ? ?Lipid Panel:  ?Recent Labs  ?Lab 06/12/21 ?0315  ?CHOL 152  ?TRIG 78  ?HDL 34*  ?CHOLHDL 4.5  ?VLDL 16  ?LDLCALC 102*  ? ? ?HgbA1c:  ?Recent Labs  ?Lab 06/12/21 ?1000  ?HGBA1C 5.3  ? ?Urine Drug Screen: No results for input(s): LABOPIA, COCAINSCRNUR, LABBENZ, AMPHETMU, THCU, LABBARB in the last 168 hours.  ?Alcohol Level No results for input(s): ETH in the last 168 hours. ? ?IMAGING past 24 hours ?Overnight EEG with video ? ?Result Date: 06/13/2021 ?Kristen Havens, MD     06/13/2021  1:06 PM Patient Name: Kristen Freeman MRN: [id number] Epilepsy Attending: Lora Freeman Referring Physician/Provider: Greta Doom, MD Duration: 06/12/2021 6433 to 06/13/2021 0915  Patient history: 71 year old female with episodes of left face due to sensation followed by loss of consciousness.  EEG to evaluate for seizure.  Level of alertness: Awake, asleep  AEDs during EEG study: None  Technical aspects: This EEG study was done with scalp electrodes positioned according to the 10-20 International  system of electrode placement. Electrical activity was acquired at a sampling rate of '500Hz'$  and reviewed with a high frequency filter of '70Hz'$  and a low frequency filter of '1Hz'$ . EEG data were recorded continuously and digitally stored.  Description: The posterior dominant rhythm consists of 8-9 Hz activity of moderate voltage (25-35 uV) seen predominantly in posterior head regions, symmetric and reactive to eye opening and eye closing. Hyperventilation and photic stimulation were not performed.   Patient reported an episode of tooth pain radiating up to her eye around 1100 on 06/12/2021.  Concomitant EEG before, during and after the event did not show any EEG change to suggest seizure.  IMPRESSION: This study is within normal limits. No seizures or epileptiform discharges were seen throughout the recording. One event was recorded around 1100 on 06/12/2021 as described above without concomitant EEG change.  This was most likely not an epileptic event.  Kristen Freeman   ? ?PHYSICAL EXAM ? ?Physical Exam  ?Constitutional: Appears well-developed and well-nourished.  ?Cardiovascular: Normal rate and regular rhythm.  ?  Respiratory: Effort normal, non-labored breathing ? ?Neuro: ?Mental Status: ?Patient is awake, alert, oriented to person, place, month, year, and situation. ?Patient is able to give a clear and coherent history. ?No signs of aphasia or neglect ?Cranial Nerves: ?II: Visual Fields are full. Pupils are equal, round, and reactive to light.   ?III,IV, VI: EOMI without ptosis or diploplia.  ?V: Facial sensation is symmetric to temperature ?VII: Facial movement is symmetric resting and smiling ?VIII: Hearing is intact to voice ?X: Palate elevates symmetrically ?XI: Shoulder shrug is symmetric. ?XII: Tongue protrudes midline without atrophy or fasciculations.  ?Motor: ?Tone is normal. Bulk is normal. 5/5 strength was present in all four extremities.  ?Sensory: ?Sensation is symmetric to light touch and temperature in  the arms and legs. No extinction to DSS present.  ?Cerebellar: ?FNF and HKS are intact bilaterally ? ? ? ?ASSESSMENT/PLAN ?[name] is a 71 y.o. 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 Given her IVC thrombus which was not felt to be candidate for filter or surgery, oncology recently restarted Eliquis.  DVT noted in right popliteal vein. Per family she is taking half of the tablet twice daily (2.5 mg dose) and this has been resumed per oncology. Palliative care consulted. VEEG neg. Will D/C today.  ?  ? 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  ?CTA head & neck Mild-to-moderate atheromatous change about the carotid bifurcations and carotid siphons. ?MRI 4 mm infarct in the posterior right middle cerebellar peduncle, Acute nonhemorrhagic 10 mm infarct in the high posterior left frontal lobe. ?MRI w/ No evidence for intracranial metastatic disease. ?2D Echo EF 65 to 70% ?LDL 102 ?HgbA1c 5.3 ?VTE prophylaxis - Eliquis 2.5 ?Eliquis (apixaban) daily (2.'5mg'$ ) prior to admission, now on Eliquis (apixaban) daily 2.'5mg'$  BID (lower dose due to bleeding risk with hemoptysis and anemia) ?Therapy recommendations:  pending ?Disposition:  pending ? ?Hypertension ?Home meds:  Lopressor '50mg'$  ?Stable ?Long-term BP goal normotensive ? ?Hyperlipidemia ?Home meds:  Atorvastatin '10mg'$  ?LDL 102, goal < 70 ?Atorvastatin '20mg'$   ?Continue statin at discharge ? ?IVC Thrombus  ?Hx of DVT ?LE venous doppler 04/2021 -left acute peroneal vein DVT, right age indeterminant popliteal vein DVT ?On Eliquis 2.'5mg'$  BID ?Closely monitor  H/H and hemoptysis  ? ?Other Stroke Risk Factors ?Advanced Age >/= 61  ? 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 Alvy Bimler- on carboplatin ?Hemoptysis, improving ?Lung mets related ?Pancytopenia ?Hgb- 7.0- transfusing 1u PRBCs>8.7 ?Plt 71 ? ?Hospital day # 1 ? ?Neurology will sign off. Con't low dose eliquis. F/u in stroke clinic. ? ?Total of 35 mins spent reviewing chart, discussion with patient and family on prognosis, Dx and plan. Discussed case with patient's nurse. Reviewed Imaging personally.  ?  ? ? ?To contact Stroke Continuity provider, please refer to [email or web address] ?After hours, contact General Neurology ? ?

## 2021-06-13 NOTE — Procedures (Signed)
Patient Name: Kristen Freeman  ?MRN: [id number]  ?Epilepsy Attending: Lora Havens  ?Referring Physician/Provider: Greta Doom, MD ?Duration: 06/13/2021 0921 to 06/13/2021 1151 ?  ?Patient history: 71 year old female with episodes of left face due to sensation followed by loss of consciousness.  EEG to evaluate for seizure. ?  ?Level of alertness: Awake, asleep ?  ?AEDs during EEG study: None ?  ?Technical aspects: This EEG study was done with scalp electrodes positioned according to the 10-20 International system of electrode placement. Electrical activity was acquired at a sampling rate of '500Hz'$  and reviewed with a high frequency filter of '70Hz'$  and a low frequency filter of '1Hz'$ . EEG data were recorded continuously and digitally stored.  ?  ?Description: The posterior dominant rhythm consists of 8-9 Hz activity of moderate voltage (25-35 uV) seen predominantly in posterior head regions, symmetric and reactive to eye opening and eye closing. Hyperventilation and photic stimulation were not performed.    ?   ?IMPRESSION: ?This study is within normal limits. No seizures or epileptiform discharges were seen throughout the recording. ?  ? ?[name] [name]  ?

## 2021-06-13 NOTE — Telephone Encounter (Signed)
Left a message for Tuesday regarding meeting with Dr. Alvy Bimler tomorrow morning.  ?

## 2021-06-13 NOTE — Consult Note (Signed)
? ?                                                                                ?Consultation Note ?Date: 06/13/2021  ? ?Patient Name: Kristen Freeman  ?DOB: [birth year]  MRN: [id number]  Age / Sex: 71 y.o., female  ?PCP: Kristen Dimmer, MD ?Referring Physician: Caren Griffins, MD ? ?Reason for Consultation: GOC ? ?HPI/Patient Profile: 71 y.o. female  with past medical history of stage IV endometrial cancer with metastasis to her lungs, s/p radiation tx and chemotherapy; IVC thrombosis, HLD, HTN admitted on 06/11/2021 with syncope. Workup reveals acute CVA. Palliative medicine consulted for goals of care.   ? ?Primary Decision Maker ?PATIENT ? ?Discussion: ?Chart reviewed including labs, progress notes, imaging.  ?I am familiar with Kristen Freeman, having seen her during her last admission- when I saw her then her [place] were to continue full scope treatment and hoping to continue chemotherapy when she was discharged. She and her family are aware of the terminal nature of their illness, but at that time, she felt she was maintaining an acceptable quality of life, she had been through chemotherapy in the past and knew what to expect- and was ready to start again in the hopes of prolonging her time.  ?She is from [place], [place] and has a strong christian faith.  ?She has previously expressed goals of care are to be able to be awake, alert, and interacting with family. She would not want to be unconscious or just existing. She also does not want to be a burden on her family.  ?Today she is awake and alert, she is deciding about ordering her lunch, her sister is at bedside. She is able to recall meeting with me during her last admission. There are no apparent deficits resulting from her stroke. PT eval indicates she is able to function independently.  ?She currently does not have any changes to her goals of care- she is agreeable to followup evaluation tomorrow with her spouse present.   ? ?SUMMARY OF  RECOMMENDATIONS ?-Continue full scope, full code ?-Follwup tomorrow at 1pm planned with spouse to be present on speakerphone   ? ?Code Status/Advance Care Planning: ?Full code ? ? ?Prognosis:   ?Unable to determine ? ?Discharge Planning: To Be Determined ? ?Primary Diagnoses: ?Present on Admission: ? Syncope ? Malignant neoplasm of endometrium metastatic to lung [facility]) ? IVC thrombosis ([place]) ? Pancytopenia, acquired ([place]) ? Glaucoma ? HTN (hypertension) ? HLD (hyperlipidemia) ? Prediabetes ? AKI (acute kidney injury) ([place]) ? CVA (cerebral vascular accident) [facility]) ? ? ?Review of Systems  ?Constitutional:  Negative for activity change and appetite change.  ?Respiratory:  Negative for shortness of breath.   ?Cardiovascular:  Negative for chest pain.  ?Psychiatric/Behavioral:  Negative for confusion.   ? ?Physical Exam ?Vitals and nursing note reviewed.  ?Constitutional:   ?   General: She is not in acute distress. ?   Appearance: She is not ill-appearing.  ?Pulmonary:  ?   Effort: Pulmonary effort is normal.  ?Neurological:  ?   Mental Status: She is alert and oriented to person, place, and time.  ? ? ?Vital Signs: BP 134/85   Pulse Marland Kitchen)  119   Temp 98.7 ?F (37.1 ?C) (Oral)   Resp 16   Ht '4\' 11"'$  (1.499 m)   Wt 55.4 kg   SpO2 98%   BMI 24.67 kg/m?  ?Pain Scale: 0-10 ?POSS *See Group Information*: 1-Acceptable,Awake and alert ?Pain Score: 0-No pain ? ? ?SpO2: SpO2: 98 % ?O2 Device:SpO2: 98 % ?O2 Flow Rate: .O2 Flow Rate (L/min): 4 L/min (found on 4L) ? ?IO: Intake/output summary: No intake or output data in the 24 hours ending 06/13/21 1702 ? ?LBM: Last BM Date :  (PTA) ?Baseline Weight: Weight: 54 kg ?Most recent weight: Weight: 55.4 kg     ? ? ? ?Thank you for this consult. Palliative medicine will continue to follow and assist as needed.  ? ?Signed by: ?Mariana Kaufman, AGNP-C ?Palliative Medicine ? ?  ?Please contact Palliative Medicine Team phone at [phone] for questions and concerns.  ?For individual provider: See  Amion ? ? ? ? ? ? ? ? ? ? ? ? ? ? ?

## 2021-06-14 ENCOUNTER — Other Ambulatory Visit (HOSPITAL_COMMUNITY): Payer: Self-pay

## 2021-06-14 ENCOUNTER — Telehealth: Payer: Self-pay | Admitting: Oncology

## 2021-06-14 DIAGNOSIS — C7801 Secondary malignant neoplasm of right lung: Secondary | ICD-10-CM | POA: Diagnosis not present

## 2021-06-14 DIAGNOSIS — C541 Malignant neoplasm of endometrium: Secondary | ICD-10-CM | POA: Diagnosis not present

## 2021-06-14 DIAGNOSIS — C7802 Secondary malignant neoplasm of left lung: Secondary | ICD-10-CM

## 2021-06-14 DIAGNOSIS — I639 Cerebral infarction, unspecified: Secondary | ICD-10-CM | POA: Diagnosis not present

## 2021-06-14 DIAGNOSIS — I82220 Acute embolism and thrombosis of inferior vena cava: Secondary | ICD-10-CM | POA: Diagnosis not present

## 2021-06-14 DIAGNOSIS — Z7189 Other specified counseling: Secondary | ICD-10-CM

## 2021-06-14 DIAGNOSIS — N179 Acute kidney failure, unspecified: Secondary | ICD-10-CM | POA: Diagnosis not present

## 2021-06-14 LAB — COMPREHENSIVE METABOLIC PANEL
ALT: 18 U/L (ref 0–44)
AST: 31 U/L (ref 15–41)
Albumin: 2.6 g/dL — ABNORMAL LOW (ref 3.5–5.0)
Alkaline Phosphatase: 80 U/L (ref 38–126)
Anion gap: 7 (ref 5–15)
BUN: <5 mg/dL — ABNORMAL LOW (ref 8–23)
CO2: 25 mmol/L (ref 22–32)
Calcium: 8.5 mg/dL — ABNORMAL LOW (ref 8.9–10.3)
Chloride: 101 mmol/L (ref 98–111)
Creatinine, Ser: 0.6 mg/dL (ref 0.44–1.00)
GFR, Estimated: >60 mL/min (ref >60)
Glucose, Bld: 123 mg/dL — ABNORMAL HIGH (ref 70–99)
Potassium: 3.2 mmol/L — ABNORMAL LOW (ref 3.5–5.1)
Sodium: 133 mmol/L — ABNORMAL LOW (ref 135–145)
Total Bilirubin: 1.3 mg/dL — ABNORMAL HIGH (ref 0.3–1.2)
Total Protein: 5.8 g/dL — ABNORMAL LOW (ref 6.5–8.1)

## 2021-06-14 LAB — CBC
HCT: 25.8 % — ABNORMAL LOW (ref 36.0–46.0)
Hemoglobin: 8.8 g/dL — ABNORMAL LOW (ref 12.0–15.0)
MCH: 31.9 pg (ref 26.0–34.0)
MCHC: 34.1 g/dL (ref 30.0–36.0)
MCV: 93.5 fL (ref 80.0–100.0)
Platelets: 56 10*3/uL — ABNORMAL LOW (ref 150–400)
RBC: 2.76 MIL/uL — ABNORMAL LOW (ref 3.87–5.11)
RDW: 24.1 % — ABNORMAL HIGH (ref 11.5–15.5)
WBC: 3.7 10*3/uL — ABNORMAL LOW (ref 4.0–10.5)
nRBC: 0 % (ref 0.0–0.2)

## 2021-06-14 MED ORDER — DEXAMETHASONE 4 MG PO TABS
4.0000 mg | ORAL_TABLET | Freq: Every day | ORAL | 0 refills | Status: AC
  Filled 2021-06-14: qty 15, 15d supply, fill #0

## 2021-06-14 MED ORDER — POTASSIUM CHLORIDE CRYS ER 20 MEQ PO TBCR
40.0000 meq | EXTENDED_RELEASE_TABLET | Freq: Once | ORAL | Status: AC
  Administered 2021-06-14: 40 meq via ORAL
  Filled 2021-06-14: qty 2

## 2021-06-14 MED ORDER — PANTOPRAZOLE SODIUM 40 MG PO TBEC
40.0000 mg | DELAYED_RELEASE_TABLET | Freq: Every day | ORAL | 0 refills | Status: AC
  Filled 2021-06-14: qty 30, 30d supply, fill #0

## 2021-06-14 MED ORDER — DEXAMETHASONE 4 MG PO TABS
4.0000 mg | ORAL_TABLET | Freq: Every day | ORAL | Status: DC
  Administered 2021-06-14: 4 mg via ORAL
  Filled 2021-06-14: qty 1

## 2021-06-14 MED ORDER — HEPARIN SOD (PORK) LOCK FLUSH 100 UNIT/ML IV SOLN
500.0000 [IU] | INTRAVENOUS | Status: AC | PRN
  Administered 2021-06-14: 500 [IU]
  Filled 2021-06-14: qty 5

## 2021-06-14 MED ORDER — HYDROCODONE-ACETAMINOPHEN 5-325 MG PO TABS
1.0000 | ORAL_TABLET | Freq: Four times a day (QID) | ORAL | 0 refills | Status: AC | PRN
  Filled 2021-06-14: qty 20, 5d supply, fill #0

## 2021-06-14 NOTE — Progress Notes (Signed)
Patient being discharged per MD orders. AVS teaching given to patient and patient's daughter. Dressing change education given and 5 days of supplies provided. No further needs noted. IV team deaccessed port. Patient discharged home via private auto.  ?

## 2021-06-14 NOTE — Progress Notes (Signed)
? ?                                                                                                                                                     ?                                                   ?Daily Progress Note  ? ?Patient Name: Kristen Freeman       Date: 06/14/2021 ?DOB: 1950-08-23  Age: 71 y.o. MRN#: [id number] ?Attending Physician: Albertine Patricia, MD ?Primary Care Physician: Allie Dimmer, MD ?Admit Date: 06/11/2021 ? ?Reason for Consultation/Follow-up: Establishing goals of care ? ?Patient Profile/HPI:  71 y.o. female  with past medical history of stage IV endometrial cancer with metastasis to her lungs, s/p radiation tx and chemotherapy; IVC thrombosis, HLD, HTN admitted on 06/11/2021 with syncope. Workup reveals acute CVA. Palliative medicine consulted for goals of care.   ? ?Subjective: Chart reviewed including labs, progress notes. Met with patient and her daughter Tuesday. Case Manager Ronaldo Miyamoto was also kindly present.  ?They met with Dr. Alvy Bimler this morning. They understand that patient has terminal cancer and is likely to gain benefit from further treatments.  ?Patient is very accepting of her prognosis- she has a strong spiritual faith and shares she has achieved all that she has wanted in this life and she looks forward to her next spiritual life.  ?They asked about support resources at home.  ?We discussed option for hospice- hospice philosophy and services were reviewed. Patient and daughter are in agreement with receiving hospice services.  ? ?Review of Systems  ?Constitutional:  Positive for malaise/fatigue.  ?Respiratory:  Positive for cough.   ? ? ?Physical Exam ?Vitals and nursing note reviewed.  ?Skin: ?   General: Skin is warm and dry.  ?Neurological:  ?   Mental Status: She is oriented to person, place, and time.  ?Psychiatric:     ?   Mood and Affect: Mood normal.     ?   Behavior: Behavior normal.     ?   Thought Content: Thought content normal.     ?   Judgment:  Judgment normal.  ?         ? ?Vital Signs: BP (!) 167/99 (BP Location: Right Arm)   Pulse (!) 103   Temp 98.6 ?F (37 ?C) (Oral)   Resp 20   Ht _0  (1.499 m)   Wt 55.8 kg   SpO2 95%   BMI 24.85 kg/m?  ?SpO2: SpO2: 95 % ?O2 Device: O2 Device: Room Air ?O2 Flow Rate: O2 Flow Rate (L/min): 4 L/min (found on 4L) ? ?Intake/output summary:  ?Intake/Output Summary (  Last 24 hours) at 06/14/2021 1428 ?Last data filed at 06/13/2021 1800 ?Gross per 24 hour  ?Intake 800 ml  ?Output --  ?Net 800 ml  ? ?LBM: Last BM Date : 06/14/21 ?Baseline Weight: Weight: 54 kg ?Most recent weight: Weight: 55.8 kg ? ?     ?Palliative Assessment/Data: PPS: 50% ? ? ? ? ? ?Patient Active Problem List  ? Diagnosis Date Noted  ? Sinus tachycardia 06/13/2021  ? CVA (cerebral vascular accident) ([place]) 06/13/2021  ? Cerebral thrombosis with cerebral infarction 06/12/2021  ? AKI (acute kidney injury) ([place]) 06/12/2021  ? Syncope 06/11/2021  ? Hematemesis 06/11/2021  ? Port-A-Cath in place 06/05/2021  ? Mild protein-calorie malnutrition ([place]) 06/05/2021  ? Other constipation 05/31/2021  ? 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 Hypokalemia 05/30/2021  ? Pancytopenia, acquired ([place]) 05/30/2021  ? Malignant neoplasm metastatic to both lungs [facility])   ? IVC thrombosis ([place]) 05/23/2021  ? Acute blood loss anemia 05/22/2021  ? Malignant neoplasm metastatic to lung [facility]) 05/22/2021  ? HLD (hyperlipidemia) 05/20/2021  ? Glaucoma 05/20/2021  ? Hemoptysis 05/20/2021  ? Normocytic anemia 05/20/2021  ? Malignant neoplasm of endometrium metastatic to lung ([place]) 05/19/2021  ? HTN (hypertension) 05/19/2021  ? Thrombocytopenia ([place]) 07/12/2020  ? Prediabetes 08/02/2016  ? Iron deficiency anemia 04/04/2000  ? ? ?Palliative Care Assessment & Plan  ? ? ?Assessment/Recommendations/Plan ? ?Stage IV endometrial cancer with progression despite radiation and chemotherapy- plan for discharge home with hospice ? ? ?Code  Status: ?DNR ? ?Prognosis: ? < 3 months ? ?Discharge Planning: ?Home with Hospice ? ?Care plan was discussed with patient's family and care team ? ?Thank you for allowing the Palliative Medicine Team to assist in the care of this patient. ? ?Total time:  75 minutes ? ?   ?Greater than 50%  of this time was spent counseling and coordinating care related to the above assessment and plan. ? ?Mariana Kaufman, AGNP-C ?Palliative Medicine ? ? ?Please contact Palliative Medicine Team phone at [phone] for questions and concerns.  ? ? ? ? ? ? ?

## 2021-06-14 NOTE — Discharge Instructions (Signed)
Follow with Primary MD Allie Dimmer, MD in 7 days  ? ?Get CBC, CMP,  checked  by Primary MD next visit.  ? ? ?Activity: As tolerated with Full fall precautions use walker/cane & assistance as needed ? ? ?Disposition Home  ? ? ?Diet: Heart Healthy ? ? ?On your next visit with your primary care physician please Get Medicines reviewed and adjusted. ? ? ?Please request your Prim.MD to go over all Hospital Tests and Procedure/Radiological results at the follow up, please get all Hospital records sent to your Prim MD by signing hospital release before you go home. ? ? ?If you experience worsening of your admission symptoms, develop shortness of breath, life threatening emergency, suicidal or homicidal thoughts you must seek medical attention immediately by calling 911 or calling your MD immediately  if symptoms less severe. ? ?You Must read complete instructions/literature along with all the possible adverse reactions/side effects for all the Medicines you take and that have been prescribed to you. Take any new Medicines after you have completely understood and accpet all the possible adverse reactions/side effects.  ? ?Do not drive, operating heavy machinery, perform activities at heights, swimming or participation in water activities or provide baby sitting services if your were admitted for syncope or siezures until you have seen by Primary MD or a Neurologist and advised to do so again. ? ?Do not drive when taking Pain medications.  ? ? ?Do not take more than prescribed Pain, Sleep and Anxiety Medications ? ?Special Instructions: If you have smoked or chewed Tobacco  in the last 2 yrs please stop smoking, stop any regular Alcohol  and or any Recreational drug use. ? ?Wear Seat belts while driving. ? ? ?Please note ? ?You were cared for by a hospitalist during your hospital stay. If you have any questions about your discharge medications or the care you received while you were in the hospital after you are  discharged, you can call the unit and asked to speak with the hospitalist on call if the hospitalist that took care of you is not available. Once you are discharged, your primary care physician will handle any further medical issues. Please note that NO REFILLS for any discharge medications will be authorized once you are discharged, as it is imperative that you return to your primary care physician (or establish a relationship with a primary care physician if you do not have one) for your aftercare needs so that they can reassess your need for medications and monitor your lab values.  ?

## 2021-06-14 NOTE — Telephone Encounter (Signed)
Called Tuesday and advised her of appointment with Dr. Berline Lopes on 06/16/21 at 9:45 at the [facility].  Advised them to arrive 20 minutes early to check in.  ?

## 2021-06-14 NOTE — Progress Notes (Signed)
Kristen Freeman   DOB:[birth year]   HM#:[id number]   ? ?ASSESSMENT & PLAN:  ?Metastatic uterine cancer ?The patient is admitted due to syncopal episode, MRI of the brain without contrast show evidence of acute infarct ?Overall, her prognosis is very poor ?She has ongoing hemoptysis despite completing a course of palliative radiation to her lungs as well as recent chemotherapy ?I have reviewed CT angiogram with her daughter. She has enlarged LN and lung metastases, worse in a span of 2 weeks despite chemo and radiation. 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 Berline Lopes who is trained at [facility] ?However, I do not think she can be seen today and this should not delay discharge ?I will help arrange this in the outpatient unless she remained hospitalized longer for other reasons ?  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  ?Acquired pancytopenia ?Multifactorial, related to recent treatment and ongoing bleeding with consumption ?I suspect the cause of her ischemia is somewhat  induced by pancytopenia ?She had  received blood on Monday ?I plan to keep hemoglobin above 8 due to recent infarct/ischemia ?She does not need platelet transfusion unless it drops less than 50,000 or bleeding ?  ?Code Status ?From our prior discussion in prior hospitalization, she remain on full code. Today, we discussed this again. Ultimately she agreed for DNR ?  ?Goals of care ?I recommend we transition care to comfort measures ?She is undecided somewhat ?  ?Discharge planning ?I anticipate discharge soon once the patient and family can process the information and agrees to transition her care to palliative mode ?I will arrange consult with Dr. Berline Lopes for second opinion ? ?All questions were answered. The patient knows to call the clinic with any problems, questions or concerns. ?  ?The total time spent in the appointment was 55 minutes encounter with patients including review of chart and various tests results, discussions about plan of care and coordination of care plan ? ?Heath Lark, MD ?06/14/2021 9:13 AM ? ?Subjective:  ?I met with her and her daughter, Tuesday at 7 am. Her son [name] was on the phone. Since she was admitted, she noticed changes in her voice. She continues to have mild daily hemoptysis. No neurological deficits ? ?Objective:  ?Vitals:  ? 06/14/21 0318 06/14/21 0800  ?BP: (!) 143/84   ?Pulse: 83 (!) 103  ?Resp: 20 20  ?Temp: 97.9 ?F (36.6 ?C) 98.6 ?F (37 ?C)  ?SpO2: 93%   ?  ? ?Intake/Output Summary (Last 24 hours) at 06/14/2021 0913 ?Last data filed at 06/13/2021 1800 ?Gross per 24 hour  ?Intake 800 ml  ?  Output --  ?Net 800 ml  ? ? ?GENERAL:alert, no distress and comfortable ?NECK: she has palpable left neck mass ?NEURO: alert & oriented x 3 with fluent speech, no focal motor/sensory deficits ?  ?Labs:  ?Recent Labs  ?  05/21/21 ?1140 05/22/21 ?0441 06/12/21 ?0315 06/13/21 ?2671 06/14/21 ?0404  ?NA  --    < > 136 135 133*  ?K  --    < > 3.5 2.9* 3.2*  ?CL  --    < > 106 102 101  ?CO2  --    < > 23 25 25   ?GLUCOSE  --    < >  123* 187* 123*  ?BUN  --    < > 8 5* <5*  ?CREATININE  --    < > 0.56 0.71 0.60  ?CALCIUM  --    < > 8.8* 8.5* 8.5*  ?GFRNONAA  --    < > >60 >60 >60  ?PROT 6.7   < > 5.7* 5.7* 5.8*  ?ALBUMIN 3.3*   < > 2.6* 2.5* 2.6*  ?AST 27   < > 26 33 31  ?ALT 23   < > 16 16 18   ?ALKPHOS 67   < > 70 81 80  ?BILITOT 0.8   < > 0.9 1.4* 1.3*  ?BILIDIR 0.2  --   --   --   --   ?IBILI 0.6  --   --   --   --   ? < > = values in this interval not displayed.  ? ? ?Studies: I reviewed Ct angiogram of chest with her daughter ?CT ANGIO HEAD NECK W WO CM ? ?Result Date: 06/11/2021 ?CLINICAL DATA:  Follow-up examination for acute stroke. EXAM: CT ANGIOGRAPHY HEAD AND NECK TECHNIQUE: Multidetector CT imaging of the head and neck was performed using the standard protocol during bolus administration of intravenous contrast. Multiplanar CT image reconstructions and MIPs were obtained to evaluate the vascular anatomy. Carotid stenosis measurements (when applicable) are obtained utilizing NASCET criteria, using the distal internal carotid diameter as the denominator. RADIATION DOSE REDUCTION: This exam was performed according to the departmental dose-optimization program which includes automated exposure control, adjustment of the mA and/or kV according to patient size and/or use of iterative reconstruction technique. CONTRAST:  131m OMNIPAQUE IOHEXOL 350 MG/ML SOLN COMPARISON:  Prior MRI from earlier the same day. FINDINGS: CT HEAD FINDINGS Brain: Age-related cerebral atrophy with chronic small vessel ischemic disease. Previously identified scattered small volume infarcts not visible by CT. No other acute large vessel territory infarct. No intracranial hemorrhage. No mass lesion, midline shift or mass effect. No hydrocephalus or extra-axial fluid collection. Vascular: No hyperdense vessel. Scattered vascular calcifications noted within the carotid siphons. Skull: Scalp soft tissues and calvarium within normal limits. Sinuses: Paranasal sinuses  and mastoid air cells are largely clear. Orbits: Globes orbital soft tissues demonstrate no acute finding. Review of the MIP images confirms the above findings CTA NECK FINDINGS Aortic arch: Visualized aortic arch normal in caliber with normal branch pattern. Mild atheromatous plaque along the undersurface of the arch itself. Arch and origin of the great vessels partially encased by extensive mediastinal adenopathy. No significant stenosis. Right carotid system: Right CCA patent from its origin to the bifurcation without stenosis. Mild eccentric soft plaque at the right bifurcation without hemodynamically significant stenosis. Right ICA patent distally without stenosis or dissection. Left carotid system: Left common and internal carotid arteries patent without stenosis or dissection. Minimal plaque at the left bifurcation without stenosis.  Vertebral arteries: Both vertebral arteries arise from subclavian arteries. No proximal subclavian artery stenosis. Left vertebral artery dominant. Vertebral arteries patent without stenosis or dissection. Skeleton: No discrete or worrisome osseous lesions. Moderate spondylosis present at C5-6 through C7-T1. Degenerative changes noted about the left TMJ. Other neck: No other acute soft tissue abnormality within the neck. Upper chest: Extensive bulky heterogeneous and hypodense adenopathy seen within the upper mediastinum, partially encasing the great vessels. Adenopathy extends to involve the left supraclavicular region and upper left chest wall. Multiple scattered pulmonary nodules/masses seen throughout the visualized lungs, largest of which measures 3.7 cm at the left lung apex. Overall, these findings are progressed and worsened from prior. Left-sided central venous catheter in place. Review of the MIP images confirms the above findings CTA HEAD FINDINGS Anterior circulation: Petrous segments patent bilaterally. Atheromatous change seen throughout the carotid siphons with  associated mild to moderate narrowing. A1 segments patent bilaterally. Normal anterior communicating artery complex. Anterior cerebral arteries widely patent. No M1 stenosis or occlusion. No proximal MCA branch occ

## 2021-06-14 NOTE — Discharge Summary (Signed)
Physician Discharge Summary  ?Gennaro Africa WUX:[id number] DOB: Mar 31, 1950 DOA: 06/11/2021 ? ?PCP: Allie Dimmer, MD ? ?Admit date: 06/11/2021 ?Discharge date: 06/14/2021 ? ?Admitted From: Home ?Disposition:  Home  ? ?Recommendations for Outpatient Follow-up:  ?Patient to follow-up with gynecology oncology Dr. Berline Lopes on 4/21 ? ?Home Health:NO ? ? ?Discharge Condition:guarded ?CODE STATUS: DNR, was confirmed by patient and daughter at bedside, DNR form was provided at time of discharge ?Diet recommendation: Regular  ? ?Brief/Interim Summary: ? ? ?71 year old female with history of endometrial cancer with mets, IVC thrombosis, pancytopenia, HTN, HLD, hemoptysis comes to the hospital with a syncopal episode.  This happened the night prior to admission, patient had an episode of a hot flash, nausea, and passed out.  Next thing she remembers the family standing over while she was lying in bed.  She also had an episode of vomiting afterwards, and family noted some blood in her emesis.  No seizure-like activity or postictal period observed.  She was brought to the hospital and admitted ?  ? ?Acute CVA-MRI done on admission showed acute nonhemorrhagic 4 mm infarct in the posterior right middle cerebral peduncle as well as an acute nonhemorrhagic 10 mm infarct in the high posterior left frontal lobe.  Neurology consulted and followed patient while hospitalized.  She is undergoing a stroke work-up, CT angio head and neck was negative for large vessel occlusion. Hemoglobin A1c is 5.3.  Lipid panel showed an LDL of 102.  She is on atorvastatin.  2D echo shows EF 65-70%, no WMA, grade 2 diastolic dysfunction.  RV was normal.  Therapy evaluations pending.  Continue low-dose Eliquis that she was on at home ? ?Syncope-possibly due to #1.  She had recurrent episode while in the ED right before admission.  CT angiogram negative for PE. Neurology following, he was EEG without seizures.  No further episodes ?  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  Being treated by Dr. Alvy Bimler as an outpatient, currently receiving carboplatin.  There are plans in place for her to receive full dose carboplatin and paclitaxel in a few weeks.  Unfortunately CT scan of the chest done on admission shows worsening disease.  Ongoing goals of care discussions, Dr. Alvy Bimler had a family meeting today, overall prognosis is poor, second opinion has been arranged by Dr. Florene Glen with Dr. Berline Lopes, appointment scheduled for this coming Friday, she will be discharged on Decadron 4 mg oral daily. ?-Patient will be discharged on Vicodin giving shoulder pain. ?  ?Hemoptysis-likely due to her lung metastasis.  Has been going on for several weeks but overall improving.  She was recently admitted 3/24-4/7, 2023 with hemoptysis, but appears to have improved since.  She underwent radiation therapy to her chest to help with this, but unfortunately CT scan shows worsening metastatic disease.  Hemoptysis is stable, low-grade ?  ?Hypokalemia-repleted ?  ?Sinus tachycardia-slightly worse today, with rates into the 120s-130s.  Home beta-blockers were held due to her acute CVA, resume metoprolol today.  Tachycardia also is likely worsened in the setting of metastatic burden ?  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  Hemoptysis is low-grade at this point.  Discussed with oncology this morning, resume low-dose Eliquis and continue to closely monitor.  IR consulted during her prior hospitalization, she was not a candidate for thrombectomy or  IVC filter placement ?  ?Pancytopenia-multifactorial due to blood loss as well as effects from chemotherapy.  Continue to monitor.  She was transfused unit of packed red blood cells 4/17 for hemoglobin of 7.0, improved appropriately this morning and appears  stable. ?  ?Essential hypertension-allow permissive hypertension in the setting of acute CVA ? ?Hyperlipidemia-continue statin ?  ?Goals of care- palliative care consulted, and is currently DNR after family meeting with oncologist, this was confirmed by patient and daughter at bedside  ? ? ?Discharge Diagnoses:  ?Principal Problem: ?  AKI (acute kidney injury) ([place]) ?Active Problems: ?  Malignant neoplasm of endometrium metastatic to lung [facility]) ?  IVC thrombosis ([place]) ?  Pancytopenia, acquired ([place]) ?  Glaucoma ?  HTN (hypertension) ?  HLD (hyperlipidemia) ?  Prediabetes ?  Syncope ?  Hematemesis ?  Cerebral thrombosis with cerebral infarction ?  Sinus tachycardia ?  CVA (cerebral vascular accident) [facility]) ? ? ? ?Discharge Instructions ? ?Discharge Instructions   ? ? Discharge instructions   Complete by: As directed ?  ? Follow with Primary MD Allie Dimmer, MD in 7 days  ? ?Get CBC, CMP,  checked  by Primary MD next visit.  ? ? ?Activity: As tolerated with Full fall precautions use walker/cane & assistance as needed ? ? ?Disposition Home  ? ? ?Diet: Heart Healthy ? ? ?On your next visit with your primary care physician please Get Medicines reviewed and adjusted. ? ? ?Please request your Prim.MD to go over all Hospital Tests and Procedure/Radiological results at the follow up, please get all Hospital records sent to your Prim MD by signing hospital release before you go home. ? ? ?If you experience worsening of your admission symptoms, develop shortness of breath, life threatening emergency, suicidal or homicidal thoughts you must seek medical attention immediately by calling 911 or calling your MD immediately  if symptoms less severe. ? ?You Must read complete instructions/literature along with all the possible adverse reactions/side effects for all the Medicines you take and that have been prescribed to you. Take any new Medicines after you have completely understood and accpet all the possible adverse  reactions/side effects.  ? ?Do not drive, operating heavy machinery, perform activities at heights, swimming or participation in water activities or provide baby sitting services if your were admitted for syncope or siezures until you have seen by Primary MD or a Neurologist and advised to do so again. ? ?Do not drive when taking Pain medications.  ? ? ?Do not take more than prescribed Pain, Sleep and Anxiety Medications ? ?Special Instructions: If you have smoked or chewed Tobacco  in the last 2 yrs please stop smoking, stop any regular Alcohol  and or any Recreational drug use. ? ?Wear Seat belts while driving. ? ? ?Please note ? ?You were cared for by a hospitalist during your hospital stay. If you have any questions about your discharge medications or the care you received while you were in the hospital after you are discharged, you can call the unit and asked to speak with the hospitalist on call if the hospitalist that took care of you is not available. Once you are discharged, your primary care physician will handle any further medical issues. Please note that NO REFILLS for any discharge medications will be authorized once you are discharged, as it is imperative that you return to your primary care physician (or establish a relationship with a primary care physician if you do not have one) for your aftercare  needs so that they can reassess your need for medications and monitor your lab values.  ? Increase activity slowly   Complete by: As directed ?  ? No wound care   Complete by: As directed ?  ? ?  ? ?Allergies as of 06/14/2021   ? ?   Reactions  ? Latex Rash, Other (See Comments)  ? Band-Aids are not tolerated  ? Tape Rash  ? ?  ? ?  ?Medication List  ?  ? ?STOP taking these medications   ? ?famotidine 20 MG tablet ?Commonly known as: PEPCID ?  ? ?  ? ?TAKE these medications   ? ?acetaminophen 500 MG tablet ?Commonly known as: TYLENOL ?Take 500 mg by mouth every 6 (six) hours as needed for mild pain or  headache. ?  ?atorvastatin 10 MG tablet ?Commonly known as: LIPITOR ?Take 1 tablet (10 mg total) by mouth daily. ?  ?bisacodyl 5 MG EC tablet ?Generic drug: bisacodyl ?Take 5 mg by mouth daily. ?  ?CALCIUM + D3 PO ?[place]

## 2021-06-14 NOTE — TOC Transition Note (Signed)
Transition of Care (TOC) - CM/SW Discharge Note ? ? ?Patient Details  ?Name: Kristen Freeman ?MRN: [id number] ?Date of Birth: [birth year] ? ?Transition of Care (TOC) CM/SW Contact:  ?Cyndi Bender, RN ?Phone Number: ?06/14/2021, 1:53 PM ? ? ?Clinical Narrative:    ?Spoke with patient and daughter, Tuesday at bedside. They are both requesting home hospice. Referral made to [facility]. Information faxed to [facility]. Awaiting confirmation of referral acceptance. Nurse will teach daughter how to do dressing change and give them enough supplies for 5 days. Daughter will transport home.  ? ?Final next level of care: [place] ?Barriers to Discharge: Barriers Resolved ? ? ?Patient Goals and CMS Choice ?Patient states their goals for this hospitalization and ongoing recovery are:: return home ?CMS Medicare.gov Compare Post Acute Care list provided to:: Patient ?Choice offered to / list presented to : Patient ? ?Discharge Placement ?  ?           ? home ?  ?  ?  ? ?Discharge Plan and Services ?  ?  ?Post Acute Care Choice: Hospice          ?  ?  ?  ?  ?  ?  ?[place] Agency: Other - See comment ([facility]) ?Date HH Agency Contacted: 06/14/21 ?Time [place]: 3662 ?Representative spoke with at [place]: Shelton Silvas ? ?Social Determinants of Health (SDOH) Interventions ?  ? ? ?Readmission Risk Interventions ?   ? View : No data to display.  ?  ?  ?  ? ? ? ? ? ?

## 2021-06-15 ENCOUNTER — Telehealth: Payer: Self-pay | Admitting: Oncology

## 2021-06-15 NOTE — Telephone Encounter (Signed)
Left a message regarding appointments next week.  Requested a return call. ?

## 2021-06-15 NOTE — Telephone Encounter (Signed)
Tuesday called and wants to cancel Kristen Freeman's appointment with Dr. Berline Lopes tomorrow.  They have decided on hospice and the hospice nurse was seeing her now.  Advised I will cancel the appointment and also notify Dr. Alvy Bimler. ?

## 2021-06-15 NOTE — Telephone Encounter (Signed)
Tuesday called back and they want to keep the appointments with Dr. Alvy Bimler on 06/22/21. ?

## 2021-06-15 NOTE — Telephone Encounter (Signed)
Can you verify since they are ok with hospice for [place] to cancel her appt next week? ?If they agree, please proceed to cancel ?

## 2021-06-16 ENCOUNTER — Ambulatory Visit: Payer: Medicare Other | Admitting: Gynecologic Oncology

## 2021-06-20 ENCOUNTER — Telehealth: Payer: Self-pay | Admitting: Oncology

## 2021-06-20 NOTE — Telephone Encounter (Signed)
2nd opinion requested by [name] (son). Called in 2nd opinion to [facility] - Dr. Blake Divine office.  Also faxed records to [phone].  They will call [place] with the appointment. ? ? ?

## 2021-06-21 ENCOUNTER — Telehealth: Payer: Self-pay | Admitting: Oncology

## 2021-06-22 ENCOUNTER — Inpatient Hospital Stay: Payer: Medicare Other | Admitting: Hematology and Oncology

## 2021-06-22 ENCOUNTER — Inpatient Hospital Stay: Payer: Medicare Other

## 2021-06-23 ENCOUNTER — Inpatient Hospital Stay: Payer: Medicare Other

## 2021-06-26 NOTE — Telephone Encounter (Signed)
Sorry to hear that ?Please make sure we mark her chart as deceased ?

## 2021-06-26 NOTE — Telephone Encounter (Signed)
[name] called and wanted to let us know that Kristen Freeman passed away this morning.  He wanted to cancel her appointments for tomorrow and thank everyone for their care of [name]. ?

## 2021-06-26 DEATH — deceased

## 2021-07-06 ENCOUNTER — Ambulatory Visit: Payer: Medicare Other | Admitting: Urology

## 2021-10-16 NOTE — Progress Notes (Signed)
  Radiation Oncology         [phone]) [phone] ________________________________  Name: Kristen Freeman MRN: [id number]  Date: 06/02/2021  DOB: 1950/06/19  End of Treatment Note  Diagnosis:    71 yo woman with hemoptysis from right lower lung metastasis from endometrial cancer     Indication for treatment:  Palliation       Radiation treatment dates:   3/27-06/02/21  Site/dose:   The RLL mass got 30 Gy in 10 fractions of 3 Gy  Beams/energy:   3D conformal 6X fields were used from two fixed gantry fields and two DCAs  Narrative: The patient tolerated radiation treatment relatively well.     Plan: The patient has completed radiation treatment. The patient will return to radiation oncology clinic for routine followup in one month. I advised her to call or return sooner if she has any questions or concerns related to her recovery or treatment. ________________________________  Sheral Apley. Tammi Klippel, M.D.
